# Patient Record
Sex: Male | Born: 1985
Health system: Southern US, Community
[De-identification: ages and names within clinical notes are randomized; demographics above are authoritative.]

## PROBLEM LIST (undated history)

## (undated) DIAGNOSIS — F32A Depression, unspecified: Secondary | ICD-10-CM

## (undated) DIAGNOSIS — F319 Bipolar disorder, unspecified: Secondary | ICD-10-CM

## (undated) DIAGNOSIS — S0291XA Unspecified fracture of skull, initial encounter for closed fracture: Secondary | ICD-10-CM

## (undated) DIAGNOSIS — S329XXA Fracture of unspecified parts of lumbosacral spine and pelvis, initial encounter for closed fracture: Secondary | ICD-10-CM

## (undated) DIAGNOSIS — F1110 Opioid abuse, uncomplicated: Secondary | ICD-10-CM

## (undated) DIAGNOSIS — S5290XA Unspecified fracture of unspecified forearm, initial encounter for closed fracture: Secondary | ICD-10-CM

## (undated) DIAGNOSIS — A4902 Methicillin resistant Staphylococcus aureus infection, unspecified site: Secondary | ICD-10-CM

## (undated) DIAGNOSIS — F329 Major depressive disorder, single episode, unspecified: Secondary | ICD-10-CM

## (undated) DIAGNOSIS — T1491XA Suicide attempt, initial encounter: Secondary | ICD-10-CM

## (undated) DIAGNOSIS — M109 Gout, unspecified: Secondary | ICD-10-CM

## (undated) DIAGNOSIS — F419 Anxiety disorder, unspecified: Secondary | ICD-10-CM

## (undated) DIAGNOSIS — F1910 Other psychoactive substance abuse, uncomplicated: Secondary | ICD-10-CM

## (undated) HISTORY — PX: OTHER SURGICAL HISTORY: SHX169

## (undated) HISTORY — PX: NECK SURGERY: SHX720

---
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
## 2005-05-18 ENCOUNTER — Emergency Department (HOSPITAL_COMMUNITY): Admission: EM | Admit: 2005-05-18 | Discharge: 2005-05-18 | Payer: Self-pay | Admitting: Emergency Medicine

## 2005-05-19 ENCOUNTER — Emergency Department (HOSPITAL_COMMUNITY): Admission: EM | Admit: 2005-05-19 | Discharge: 2005-05-19 | Payer: Self-pay | Admitting: Emergency Medicine

## 2005-09-18 ENCOUNTER — Inpatient Hospital Stay (HOSPITAL_COMMUNITY): Admission: EM | Admit: 2005-09-18 | Discharge: 2005-10-14 | Payer: Self-pay | Admitting: Emergency Medicine

## 2005-09-25 ENCOUNTER — Ambulatory Visit: Payer: Self-pay | Admitting: Physical Medicine & Rehabilitation

## 2005-12-14 ENCOUNTER — Ambulatory Visit: Payer: Self-pay | Admitting: Internal Medicine

## 2005-12-15 ENCOUNTER — Ambulatory Visit: Payer: Self-pay | Admitting: *Deleted

## 2005-12-29 ENCOUNTER — Encounter: Admission: RE | Admit: 2005-12-29 | Discharge: 2006-03-29 | Payer: Self-pay | Admitting: Orthopedic Surgery

## 2007-01-28 ENCOUNTER — Emergency Department (HOSPITAL_COMMUNITY): Admission: EM | Admit: 2007-01-28 | Discharge: 2007-01-28 | Payer: Self-pay | Admitting: Emergency Medicine

## 2007-01-28 ENCOUNTER — Ambulatory Visit: Payer: Self-pay | Admitting: Psychiatry

## 2007-01-28 ENCOUNTER — Inpatient Hospital Stay (HOSPITAL_COMMUNITY): Admission: AD | Admit: 2007-01-28 | Discharge: 2007-01-31 | Payer: Self-pay | Admitting: Psychiatry

## 2007-03-04 ENCOUNTER — Inpatient Hospital Stay (HOSPITAL_COMMUNITY): Admission: RE | Admit: 2007-03-04 | Discharge: 2007-03-07 | Payer: Self-pay | Admitting: Orthopedic Surgery

## 2007-03-07 ENCOUNTER — Ambulatory Visit: Payer: Self-pay | Admitting: Infectious Diseases

## 2007-03-21 ENCOUNTER — Emergency Department (HOSPITAL_COMMUNITY): Admission: EM | Admit: 2007-03-21 | Discharge: 2007-03-21 | Payer: Self-pay | Admitting: Emergency Medicine

## 2007-03-31 ENCOUNTER — Ambulatory Visit (HOSPITAL_COMMUNITY): Admission: RE | Admit: 2007-03-31 | Discharge: 2007-03-31 | Payer: Self-pay | Admitting: Orthopedic Surgery

## 2007-04-20 ENCOUNTER — Telehealth: Payer: Self-pay | Admitting: Infectious Disease

## 2007-07-29 ENCOUNTER — Inpatient Hospital Stay (HOSPITAL_COMMUNITY): Admission: RE | Admit: 2007-07-29 | Discharge: 2007-08-02 | Payer: Self-pay | Admitting: Orthopedic Surgery

## 2007-07-31 ENCOUNTER — Ambulatory Visit: Payer: Self-pay | Admitting: Infectious Disease

## 2007-08-01 ENCOUNTER — Encounter: Admission: RE | Admit: 2007-08-01 | Discharge: 2007-08-01 | Payer: Self-pay | Admitting: Orthopedic Surgery

## 2007-09-12 ENCOUNTER — Encounter: Payer: Self-pay | Admitting: Infectious Disease

## 2007-09-12 ENCOUNTER — Ambulatory Visit (HOSPITAL_COMMUNITY): Admission: RE | Admit: 2007-09-12 | Discharge: 2007-09-12 | Payer: Self-pay | Admitting: Orthopedic Surgery

## 2007-09-22 ENCOUNTER — Telehealth: Payer: Self-pay | Admitting: Infectious Disease

## 2007-09-26 ENCOUNTER — Telehealth: Payer: Self-pay | Admitting: Infectious Disease

## 2007-09-30 ENCOUNTER — Ambulatory Visit (HOSPITAL_COMMUNITY): Admission: RE | Admit: 2007-09-30 | Discharge: 2007-09-30 | Payer: Self-pay | Admitting: Infectious Disease

## 2007-10-03 ENCOUNTER — Telehealth: Payer: Self-pay | Admitting: Infectious Disease

## 2008-03-05 ENCOUNTER — Emergency Department (HOSPITAL_COMMUNITY): Admission: EM | Admit: 2008-03-05 | Discharge: 2008-03-05 | Payer: Self-pay | Admitting: *Deleted

## 2008-03-12 ENCOUNTER — Emergency Department (HOSPITAL_COMMUNITY): Admission: EM | Admit: 2008-03-12 | Discharge: 2008-03-12 | Payer: Self-pay | Admitting: Emergency Medicine

## 2008-05-14 ENCOUNTER — Emergency Department (HOSPITAL_COMMUNITY): Admission: EM | Admit: 2008-05-14 | Discharge: 2008-05-14 | Payer: Self-pay | Admitting: Emergency Medicine
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
## 2008-09-26 ENCOUNTER — Emergency Department (HOSPITAL_COMMUNITY): Admission: EM | Admit: 2008-09-26 | Discharge: 2008-09-26 | Payer: Self-pay | Admitting: Family Medicine

## 2009-03-08 ENCOUNTER — Emergency Department (HOSPITAL_COMMUNITY): Admission: EM | Admit: 2009-03-08 | Discharge: 2009-03-08 | Payer: Self-pay | Admitting: Emergency Medicine

## 2009-08-16 ENCOUNTER — Emergency Department (HOSPITAL_COMMUNITY): Admission: EM | Admit: 2009-08-16 | Discharge: 2009-08-16 | Payer: Self-pay | Admitting: Emergency Medicine

## 2010-01-11 ENCOUNTER — Emergency Department (HOSPITAL_COMMUNITY): Admission: EM | Admit: 2010-01-11 | Discharge: 2010-01-11 | Payer: Self-pay | Admitting: Emergency Medicine

## 2010-06-29 ENCOUNTER — Encounter: Payer: Self-pay | Admitting: Infectious Disease

## 2010-08-26 ENCOUNTER — Inpatient Hospital Stay (INDEPENDENT_AMBULATORY_CARE_PROVIDER_SITE_OTHER)
Admission: RE | Admit: 2010-08-26 | Discharge: 2010-08-26 | Disposition: A | Discharge status: Home / Self Care | Payer: Self-pay | Source: Ambulatory Visit | Attending: Family Medicine | Admitting: Family Medicine

## 2010-08-26 DIAGNOSIS — M542 Cervicalgia: Secondary | ICD-10-CM

## 2010-08-26 DIAGNOSIS — M62838 Other muscle spasm: Secondary | ICD-10-CM
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
## 2010-10-21 NOTE — Op Note (Signed)
NAMEBRAXLEY, BALANDRAN NO.:  [id number]   MEDICAL RECORD NO.:  [id number]          PATIENT TYPE:  OIB   LOCATION:  5020                         FACILITY:  [facility]   PHYSICIAN:  Doralee Albino. Carola Frost, M.D. DATE OF BIRTH:  1986-01-22   DATE OF PROCEDURE:  03/04/2007  DATE OF DISCHARGE:                               OPERATIVE REPORT   PREOPERATIVE DIAGNOSIS:  Left buttock abscess.   POSTOPERATIVE DIAGNOSIS:  Left buttock abscess.   PROCEDURE:  Incision and drainage of left buttock abscess, 10 cm x 11 cm  x 10 cm.   SURGEON:  Doralee Albino. Carola Frost, M.D.   ASSISTANT:  None.   ANESTHESIA:  General.   FINDINGS:  Purulence.   SPECIMENS:  Three sent to micro, results pending.   COMPLICATIONS:  None.   DISPOSITION:  PACU.   DRAINS:  One.   CONDITION:  Stable.   INDICATIONS FOR PROCEDURE:  Fred Reynolds is a 25 year old male with a  history of progressive left buttock pain.  He previously had iliosacral  screws which had migrated but then stopped and were asymptomatic for a  long period of time prior to this recent onset of symptoms.  We saw him  today preoperatively, his white count, sed rate, and CRP were all  elevated. The patient was febrile and he felt quite ill.  Examination  revealed a large soft tissue mass. He was sent for CT scan and his  planned hardware removal was cancellous. The CT scan demonstrated a very  large abscess and no communication with the iliosacral screw was  discernible.  Consequently, we chose not to remove the iliosacral screw  at this time as it may provide an open channel for the infection to  track down into vertebral body.  We discussed with him preoperatively  the risks and benefits of surgery including the possibility of further  surgery, failure to clear the infection, nerve injury, vessel injury and  others.  After a careful discussion, he wished to proceed.   DESCRIPTION OF PROCEDURE:  All antibiotics were held prior to taking  him  to the OR.  He was positioned left side up, standard prep and drape. A 4  cm incision was made directly over the pointing area which was posterior  to the old SI screw insertion incision.  We encountered a deep abscess  which was drained of several hundred mL of purulence.  It was not foul  smelling. It was irrigated with 3000 mL of normal saline under no  pressure without the pulse lavage.  A large drain was then placed and  brought out through the skin near the incision and tied into place.  The  wound was then closed with one PDS suture and widely dispersed simple  nylon sutures.  The patient had a sterile large bulky dressing applied  to try to eliminate some of the dead spaces and was then awakened from  anesthesia and transported the PACU in stable condition.   PROGNOSIS:  Fred Reynolds has an enormous abscess in his buttock area.  It does not appear to communicate with the  screw which, again, was  previously asymptomatic.  I would like to remove the screw but I am  concerned about the possibility of deep tracking into the vertebral  body.  We will ask for ID consultation and assistance. 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  He will be on empiric  vancomycin and gentamicin pending culture results.      Doralee Albino. Carola Frost, M.D.  Electronically Signed     MHH/MEDQ  D:  03/04/2007  T:  03/05/2007  Job:  [id number]
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
## 2010-10-21 NOTE — Op Note (Signed)
NAMEJUDY, Reynolds NO.:  [id number]   MEDICAL RECORD NO.:  [id number]          PATIENT TYPE:  OIB   LOCATION:  5016                         FACILITY:  [facility]   PHYSICIAN:  Doralee Albino. Carola Frost, M.D. DATE OF BIRTH:  Feb 09, 1986   DATE OF PROCEDURE:  07/29/2007  DATE OF DISCHARGE:  08/02/2007                               OPERATIVE REPORT   PREOPERATIVE DIAGNOSIS:  Symptomatic hardware right sacroiliac joint.   POSTOPERATIVE DIAGNOSIS:  Infected hardware right sacroiliac joint.   PROCEDURES:  1. Removal of the deep implant.  2. Incision and drainage of deep abscess.   SURGEON:  Doralee Albino.  Carola Frost, M.D.   ASSISTANT:  Mearl Latin, PA-C.   ANESTHESIA:  General.   COMPLICATIONS:  None.   ESTIMATED BLOOD LOSS:  50 mL.   DISPOSITION:  To PACU.   DRAINS:  One.   CONDITION:  Stable.   PRECERTIFICATION FOR PROCEDURE:  Breeze Angell is a 25 year old  male status post removal of hardware on the contralateral side which did  grow out MRSA.  The patient has had loosening of his right SI joint and  3 days of increasing pain.  We discussed preoperatively the risks and  benefits of surgery, including the possibility of abscess or infection  there.  I was specifically also concerned about the possibility of  osteomyelitis, given his plain film appearance, which was somewhat  obscured by his morbid obesity, as the patient is nearly 400 pounds.  He  understood the risks and benefits, including the need for possible deep  drainage, and wished to proceed.   BRIEF DESCRIPTION OF PROCEDURE:  Mr. Schweer was taken to the operating  room and placed in the supine position, as this was the trajectory and  positioning during which we had placed his screw initially.  We reopened  his percutaneous incision, but given his morbid obesity this incision  need to be extended considerably both proximally and distally.  Several  assistants had to help to retract his soft tissues,  and deep self-  retaining retractors were placed just to get down to the fascia and  visualize it.  We then incised the tensor and continued down to the 7.3  cannulated screw.  We then were able to identify the head, though it was  quite difficult to engage the screwdriver, given that the screw was  loose and again the patient's size.  This extended the procedure  considerably.  We were eventually able to grasp it with the use of a  sponge stick and also to hook a right-angle retractor around the washer  and safely remove this as well.  We did encounter a localized abscess  and fluid collection around the head of the screw in addition.  This  abscess was thoroughly irrigated with low-pressure pulsatile saline, and  this was performed after we obtained cultures.  After obtaining  cultures, we began empiric vancomycin.  After thorough irrigation, we  placed a deep 3/4-inch Penrose into the area, and two deep PDS sutures,  and then loosely approximated the skin layer with 2-0 nylon.  Sterile  gently compressive dressing was applied over the Penrose drain.  The  patient was then awakened from anesthesia and transported to the PACU in  stable condition.   PROGNOSIS:  Mr. Maret appears to have an infected buttock region,  with a small abscess that we believe was successfully drained.  I think  the patient does require an MRI to determine whether or not  osteomyelitis is present, but given his morbid obesity he will not be  able to fit into the gantry here at [facility] and will instead  need to be transferred to [facility] with the assistance of an  ambulance service.  After this is obtained, we will determine the length  and nature of his treatment time.  Will also obtain infectious disease  consultation.  He will be weightbearing as tolerated.      Doralee Albino. Carola Frost, M.D.  Electronically Signed     MHH/MEDQ  D:  08/02/2007  T:  08/02/2007  Job:  [id number]

## 2010-10-21 NOTE — Discharge Summary (Signed)
[name], Reynolds NO.:  [id number]   MEDICAL RECORD NO.:  [id number]          PATIENT TYPE:  OIB   LOCATION:  5016                         FACILITY:  [facility]   PHYSICIAN:  Doralee Albino. Carola Frost, M.D. DATE OF BIRTH:  [birth year]   DATE OF ADMISSION:  07/29/2007  DATE OF DISCHARGE:                               DISCHARGE SUMMARY   DISCHARGE DIAGNOSIS:  Symptomatic right sacroiliac screw with possible  osteomyelitis of right pelvis.   ADDITIONAL DISCHARGE DIAGNOSIS:  Gastroesophageal reflux.   PROCEDURES PERFORMED:  Removal of deep implant right SI screw x1.   BRIEF HISTORY AND HOSPITAL COURSE:  [name] is a 25 year old  African American male with progressive right back and buttock pain who  presented to the office on July 27, 2007, with complaints of  increasing pain on his right buttock and back.  Patient has a past  history of iliosacral screw placement for previous fracture.  He had  also had his left SI screw removed back in October of 2008 which  presented in a similar manner.  Upon presenting to the office, Mr.  Pantano complained of increasing pain on his right side, particularly  with ambulation or prolonged sitting.  He reported some radiation of  pain down into his right leg going all the way down to his foot.  The  pain was severe that it was limiting him from working and was a causing  him great disturbance.  Patient was scheduled for surgical removal of SI  screw on July 29, 2007.  However, prior to removal of the screws,  plain radiographs were obtained at the office which raised suspicion for  a possibility of osteomyelitis of his right ileum secondary to the  hyperdense appearance of the ileum.  [name] was admitted on  July 29, 2007, for removal of symptomatic SI screw.  In addition,  after removal of the SI screw, we anticipated prompt evaluation with MRI  scan to determine the presence or lack thereof of possible  osteomyelitis.   [name] tolerated the procedure exceptionally well.  Although, he  did have some severe initial postop pain in the PACU at which time a PAC  pump was started to control the patient's pain.  We were later informed  that day that the patient would be unable to receive MRI scan at [facility] secondary to weight limitations.  Subsequently, the  nursing staff made several attempts to rearrange schedules with local  radiology group which had the capabilities to accommodate [name]  size.  [name] was then able to receive his MRI scan on Monday, the  23rd of February, and as of this dictation are still awaiting the final  readings of the MRI scan.  Wound cultures obtained during the procedure  demonstrated methicillin-resistant Staphylococcus aureus which is the  same organism that was cultured from [name] back in September of  2008.  Secondary to this, an infectious disease consult was requested  and it was recommended that [name] be placed on vancomycin and  rifampin treatment for the duration of 8  weeks.  According to the  infectious disease note, regardless of the presence or absence of  osteomyelitis of the pelvis, the duration of therapy will not change  given the patient's history.   On postoperative day #4, all logistical issues were in place with regard  to home health nursing, PICC line placement, and MRI scan.  Therefore,  [name] was ready for discharge at this time.  [name]  demonstrated stable vital signs and stable labs which supported the  decision to permit discharge from the hospital with followup in 10 to 14  days at this time.   PERTINENT LABS AND DIAGNOSTIC TESTING:  Discharge vital signs,  temperature 98.7, pulse 93, respirations 20, BP 124/81, O2 sat 97% on  room air.  CBC, WBC is 10.4, hemoglobin 13.1, hematocrit 39.5, platelets  229, sodium 137, potassium 3.7, chloride 100, CO2 of 29, glucose 113,   BUN 5, creatinine 0.79.  C-reactive protein on July 30, 2007, was  16.6 which is elevated and ESR conducted on the same day was 64 which is  also elevated.   MRI scan, at the time of this discharge summary the MRI scan is awaiting  a final reading by a radiologist.   DISCHARGE PHYSICAL EXAM:  VITALS:  Noted above.  GENERAL:  Patient is awake, alert, no acute distress; however, he is  lying prone secondary to pressure on the incision site when lying  supine.  Patient is also dressed in street clothes and ready to leave  the hospital at this moment.  LUNGS:  Clear to auscultation bilaterally.  CARDIAC:  Regular rate and rhythm.  Right buttock/hip incision with some drainage but otherwise the incision  is clean and intact.  Distal motor and sensory function is intact on the right lower  extremity.  Sensation along the DPN, SPN, and TN is intact on the right  side per light touch.  EHO, FHO, anterior tibialis, posterior tibialis,  peroneals are intact with motor testing.  There is a 2+ dorsalis pedis  pulse and distal skin and temperature is appropriate on the right side.   DISCHARGE MEDICATIONS:  1. Percocet 5/235 one to two p.o. every 4 to 6 hours as needed, 50 are      given.  2. Robaxin 500 mg 1 to 2 every 6 hours as needed, 50 are given.  3. Vancomycin 1500 mg IV every 8 hours x8 weeks, this will be given in      conjunction with home health skilled nursing.  4. Rifampin 300 mg 2 at bedtime for 8 weeks.   DISCHARGE INSTRUCTIONS:  [name] can be weightbearing as tolerated  on bilateral lower extremities as his incisions appear to be stable.  He  has no specific activity restrictions but should increase activity  slowly.  He is encouraged to refrain from lying prone and supine for a  prolonged period of time to prevent the formation of decubitus bedsores.  [name] does not require any formal DVT prophylaxis at this time as  he should be ambulatory throughout most of  the day and should not  require pharmacologic DVT prophylaxis.  [name] will follow up in  10 to 14 days for wound evaluation at the office.  We also recommend  that the home health skilled nursing draw a ESR and CRP on August 07, 2007, for additional evaluation.  Should Mr. Loughry have any questions  at any point in time, he is to not hesitate to call the  office at [phone].  Mr. Ricciardi has also been provided with a wound care sheet for  appropriate wound care instructions.  Again, he has been advised not to  put any ointments or lotions over the incision and upon cessation of  drainage he may leave the wound open to the air and can leave it  uncovered.      Mearl Latin, PA      Doralee Albino. Carola Frost, M.D.  Electronically Signed    KWP/MEDQ  D:  08/02/2007  T:  08/02/2007  Job:  [id number]
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
## 2010-10-24 NOTE — Op Note (Signed)
   NAME:  Fred Reynolds, Fred Reynolds NO.:  [id number]   MEDICAL RECORD NO.:  [id number]                   PATIENT TYPE:  AMB   LOCATION:  DSC                                  FACILITY:  [facility]   PHYSICIAN:  Lowell Bouton, M.D.      DATE OF BIRTH:  1986/03/15   DATE OF PROCEDURE:  02/15/2002  DATE OF DISCHARGE:                                 OPERATIVE REPORT   PREOPERATIVE DIAGNOSIS:  Bennett's fracture/dislocation, right thumb.   POSTOPERATIVE DIAGNOSIS:  Bennett's fracture/dislocation, right thumb.   PROCEDURE:  Closed reduction and percutaneous pinning of Bennett's  fracture/dislocation, right thumb.   SURGEON:  Lowell Bouton, M.D.   ANESTHESIA:  General.   OPERATIVE FINDINGS:  The patient had a typical Bennett's  fracture/dislocation at the base of the thumb.   DESCRIPTION OF PROCEDURE:  Under general anesthesia, the right hand was  prepped and draped in the usual fashion and under x-ray control, the  fracture was reduced.  The hand was then prepped with Betadine and draped in  the usual fashion.  A 4-5 K-wire was used to place across the first  metacarpal at the [facility] joint.  After positioning the K-wire in the first  metacarpal, the closed reduction was performed and the K-wire was passed  across the joint.  X-ray did not show an anatomic reduction, so the K-wire  was backed out.  A second 4-5 K-wire was then placed across the base of the  metacarpal dorsally at the level of the fracture site.  The fracture was  reduced and the fracture itself was pinned.  The other 4-5 K-wire was then  placed across the [facility] joint.  A second 4-5 K-wire was also placed obliquely  across the metacarpal joint holding the [facility] joint reduced.  The pins were  bent over and left protruding from the skin.  X-rays showed good alignment.  The patient was placed in a thumb spica splint.  He tolerated the procedure  well and went to the recovery room awake, in  stable and good condition.                                               Lowell Bouton, M.D.    EMM/MEDQ  D:  02/15/2002  T:  02/16/2002  Job:  [id number]
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
## 2010-10-24 NOTE — Discharge Summary (Signed)
NAMEWILSON, DUSENBERY NO.:  [id number]   MEDICAL RECORD NO.:  [id number]          PATIENT TYPE:  AMB   LOCATION:  SDS                          FACILITY:  [facility]   PHYSICIAN:  Mearl Latin, PA       DATE OF BIRTH:  10-Oct-1985   DATE OF ADMISSION:  03/31/2007  DATE OF DISCHARGE:  03/31/2007                               DISCHARGE SUMMARY   DISCHARGE DIAGNOSES:  Left buttocks abscess. Additional diagnosis,  gastroesophageal reflux.   PROCEDURE:  Incision and drainage of left buttocks abscess 10 cm x 11 cm  x 10 cm.   BRIEF HISTORY/COURSE:  Fred Reynolds is a 25 year old African-American  male with a history of progressive left buttocks pain.  He previously  had iliosacral screws placed for previous fracture which had migrated  but stopped and were asymptomatic for a long period of time.  Over the  last several months, Mr. Dy has been complaining of some left-  sided pain in the SI region prompting a visit to the office for increase  with regards to removal of the SI screws.  On presentation on March 04, 2007, the original procedure to be performed was removal of SI  screws.  However, on preoperative examination and labs, some  abnormalities were noted.  Preoperatively we observed his white count  and sed rate and C-reactive protein were all elevated.  The patient was  febrile at the time and felt quite ill.   Physical examination in the day surgical unit, yielded a large soft  tissue mass over the left buttocks.  Subsequently he was sent for a CT  scan which demonstrated a very large abscess but without communication  to the SI screws.  Therefore the planned hardware removal was cancelled  and Mr. Rockwell was brought to the OR for incision and drainage of the  abscess.  Mr. Petitjean hospital stay was 3 days in duration and was  uncomplicated. He was seen by infectious disease who recommended the  placement of a PICC  line with subsequent IV  vancomycin for treatment.  On postoperative day Mr. Besson was deemed stable enough to return to  home after thorough explanation of treatment and followup.   Discharge labs, sodium 135, potassium 4.1, chloride 98, CO2 32, glucose  125, BUN 4, creatinine 0.78, calcium 3.4.  CBC WBC 13.4, hemoglobin  11.6, hematocrit 34.7, platelets 389.   DISCHARGE MEDICATIONS:  1. Vancomycin 1 g IV to be supplied by home health.  2. Percocet 5/325 one to two every 4 to 6 hours as needed.  3. Neurontin 300 mg 3 times a day.  4. Campral 333 mg 3 times a day.  5. Trazodone 100 mg at bedtime.   DISCHARGE INSTRUCTIONS:  Mr. Delduca is to be weightbearing as  tolerated on the left lower extremity.  He is to perform daily  dressing  changes.  He may stop  dressing changes once the wound remains dry  without any drainage.  The wound may get wet after it has been dry for  36 to 48 hours.  He is  to only use soap and water to clean the wound.  Mr. Cauthon will utilize his PICC line for delivery of vancomycin.  He  is to follow up at the infectious disease clinic in 2 weeks from  discharge.  Phone [phone].  Mr. Mollenkopf will return to Dr. Carola Frost in 1  week for examination of his surgical wound.  Mr. Winkels has  unrestricted activity at this time.  Due to maintain ambulatory status,  no DVT prophylaxis is required. We still, however, are awaiting for  definitive results from wound cultures to determine the duration  vancomycin.  Should Mr. Recupero have any questions prior to his return  visit he is to contact the office at [phone].   When referring to the hospital course and it being uncomplicated, just  note that Hemovac drain was pulled on postoperative day #2 without any  complication.      Mearl Latin, PA     KWP/MEDQ  D:  04/08/2007  T:  04/09/2007  Job:  [id number]
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
## 2011-02-27 LAB — CBC
HCT: 37.4 — ABNORMAL LOW
HCT: 41.1
Hemoglobin: 12.7 — ABNORMAL LOW
Hemoglobin: 13.7
MCHC: 33.2
MCHC: 33.3
MCHC: 33.9
MCV: 87.6
MCV: 88.9
Platelets: 229
Platelets: 249
RBC: 4.69
RDW: 14.7
RDW: 14.8
RDW: 15.4

## 2011-02-27 LAB — WOUND CULTURE

## 2011-02-27 LAB — BASIC METABOLIC PANEL
BUN: 5 — ABNORMAL LOW
BUN: 5 — ABNORMAL LOW
CO2: 28
CO2: 29
Calcium: 8.7
Calcium: 8.8
Chloride: 100
Creatinine, Ser: 0.79
GFR calc non Af Amer: >60
Glucose, Bld: 118 — ABNORMAL HIGH
Sodium: 136

## 2011-02-27 LAB — BODY FLUID CULTURE

## 2011-02-27 LAB — ANAEROBIC CULTURE

## 2011-02-27 LAB — VANCOMYCIN, TROUGH: Vancomycin Tr: 9.8

## 2011-02-27 LAB — SEDIMENTATION RATE: Sed Rate: 77 — ABNORMAL HIGH

## 2011-03-18 LAB — CBC
Platelets: 274
RDW: 15.6 — ABNORMAL HIGH
WBC: 7.4

## 2011-03-18 LAB — ANAEROBIC CULTURE

## 2011-03-18 LAB — GRAM STAIN

## 2011-03-18 LAB — CULTURE, ROUTINE-ABSCESS

## 2011-03-18 LAB — DIFFERENTIAL
Eosinophils Absolute: 0.4
Lymphs Abs: 1.8
Monocytes Absolute: 0.8 — ABNORMAL HIGH
Monocytes Relative: 10
Neutrophils Relative %: 58

## 2011-03-18 LAB — COMPREHENSIVE METABOLIC PANEL
ALT: 25
AST: 22
Albumin: 3.6
Alkaline Phosphatase: 94
BUN: 11
GFR calc Af Amer: >60
Potassium: 4.3
Sodium: 134 — ABNORMAL LOW
Total Protein: 7.2

## 2011-03-18 LAB — PROTIME-INR: Prothrombin Time: 13.4

## 2011-03-18 LAB — URINE CULTURE: Culture: NO GROWTH

## 2011-03-18 LAB — URINALYSIS, ROUTINE W REFLEX MICROSCOPIC
Glucose, UA: NEGATIVE
Protein, ur: NEGATIVE
Urobilinogen, UA: 0.2

## 2011-03-19 LAB — BASIC METABOLIC PANEL
CO2: 30
CO2: 32
Calcium: 8.4
Calcium: 8.7
Chloride: 98
Creatinine, Ser: 0.98
GFR calc Af Amer: >60
GFR calc Af Amer: >60
Glucose, Bld: 125 — ABNORMAL HIGH
Potassium: 4.1
Sodium: 132 — ABNORMAL LOW
Sodium: 135

## 2011-03-19 LAB — URINALYSIS, ROUTINE W REFLEX MICROSCOPIC
Glucose, UA: NEGATIVE
Ketones, ur: 15 — AB
pH: 5.5

## 2011-03-19 LAB — CULTURE, BLOOD (ROUTINE X 2): Culture: NO GROWTH

## 2011-03-19 LAB — CBC
HCT: 34.7 — ABNORMAL LOW
HCT: 42.7
Hemoglobin: 11.6 — ABNORMAL LOW
Hemoglobin: 12.6 — ABNORMAL LOW
Hemoglobin: 14.5
MCHC: 33.5
MCHC: 33.7
MCHC: 34
MCV: 89
MCV: 90.2
RBC: 3.85 — ABNORMAL LOW
RBC: 4.16 — ABNORMAL LOW
RDW: 13.8
RDW: 14.2 — ABNORMAL HIGH
WBC: 22.4 — ABNORMAL HIGH

## 2011-03-19 LAB — URINE MICROSCOPIC-ADD ON

## 2011-03-19 LAB — ANAEROBIC CULTURE

## 2011-03-19 LAB — CULTURE, ROUTINE-ABSCESS

## 2011-03-19 LAB — URINE CULTURE

## 2011-03-19 LAB — SEDIMENTATION RATE: Sed Rate: 70 — ABNORMAL HIGH

## 2011-03-20 LAB — RAPID URINE DRUG SCREEN, HOSP PERFORMED
Opiates: NOT DETECTED
Tetrahydrocannabinol: POSITIVE — AB

## 2011-03-20 LAB — DIFFERENTIAL
Basophils Absolute: 0
Basophils Relative: 0
Eosinophils Absolute: 0.3
Monocytes Relative: 7
Neutro Abs: 7.5
Neutrophils Relative %: 72

## 2011-03-20 LAB — HEPATIC FUNCTION PANEL
ALT: 42
AST: 48 — ABNORMAL HIGH
Alkaline Phosphatase: 132 — ABNORMAL HIGH
Total Protein: 8

## 2011-03-20 LAB — BASIC METABOLIC PANEL
BUN: 6
CO2: 28
Calcium: 9.7
Chloride: 102
Creatinine, Ser: 0.93
GFR calc Af Amer: >60
Glucose, Bld: 112 — ABNORMAL HIGH

## 2011-03-20 LAB — WOUND CULTURE

## 2011-03-20 LAB — CBC
MCHC: 34.5
MCV: 89.1
RBC: 5.09
RDW: 15.8 — ABNORMAL HIGH

## 2011-09-02 ENCOUNTER — Encounter (HOSPITAL_COMMUNITY): Payer: Self-pay | Admitting: Emergency Medicine

## 2011-09-02 ENCOUNTER — Emergency Department (INDEPENDENT_AMBULATORY_CARE_PROVIDER_SITE_OTHER)
Admission: EM | Admit: 2011-09-02 | Discharge: 2011-09-02 | Disposition: A | Discharge status: Home / Self Care | Payer: Self-pay | Source: Home / Self Care | Attending: Emergency Medicine | Admitting: Emergency Medicine

## 2011-09-02 DIAGNOSIS — K089 Disorder of teeth and supporting structures, unspecified: Secondary | ICD-10-CM

## 2011-09-02 DIAGNOSIS — K0889 Other specified disorders of teeth and supporting structures: Secondary | ICD-10-CM

## 2011-09-02 HISTORY — DX: Unspecified fracture of unspecified forearm, initial encounter for closed fracture: S52.90XA

## 2011-09-02 HISTORY — DX: Fracture of unspecified parts of lumbosacral spine and pelvis, initial encounter for closed fracture: S32.9XXA

## 2011-09-02 HISTORY — DX: Bipolar disorder, unspecified: F31.9

## 2011-09-02 HISTORY — DX: Methicillin resistant Staphylococcus aureus infection, unspecified site: A49.02

## 2011-09-02 HISTORY — DX: Unspecified fracture of skull, initial encounter for closed fracture: S02.91XA

## 2011-09-02 MED ORDER — PENICILLIN V POTASSIUM 500 MG PO TABS: 500.0000 mg | ORAL_TABLET | Freq: Four times a day (QID) | ORAL | Status: AC

## 2011-09-02 MED ORDER — IBUPROFEN 600 MG PO TABS: 600.0000 mg | ORAL_TABLET | Freq: Four times a day (QID) | ORAL | Status: AC | PRN

## 2011-09-02 MED ORDER — CHLORHEXIDINE GLUCONATE 0.12 % MT SOLN: OROMUCOSAL | Status: AC

## 2011-09-02 MED ORDER — HYDROCODONE-ACETAMINOPHEN 5-325 MG PO TABS
ORAL_TABLET | ORAL | Status: AC
Start: 2011-09-02 — End: 2011-09-12

## 2011-09-02 MED ORDER — LIDOCAINE VISCOUS 2 % MT SOLN: 10.0000 mL | OROMUCOSAL | Status: AC | PRN

## 2011-09-02 NOTE — ED Notes (Signed)
PT HERE WITH RIGHT UPPER TOOTH ACHE THAT STARTED THIS AM.PT TRIED SALT WATER AND IBUPROFEN FOR PAIN BUT NO RELIEF.SWELLING NOTED BUT DENIES FEVERS OR DRAINAGE

## 2011-09-02 NOTE — Discharge Instructions (Signed)
Go to www.goodrx.com to look up your medications. This will give you a list of where you can find your prescriptions at the most affordable prices.  ° °RESOURCE GUIDE ° °Dental Problems ° °Look up http://[email or web address]/[email or web address] for a schedule of the [facility]'s free dental clinics called [facility]. They have clinics all around [place]. Get there early and be prepared to wait.  ° °[facility] °[address]  [place], [place] [address] °[phone] ° °[facility] °[address] °[place], [place] °[phone] ° °Patients with Medicaid: °[facility]                     [facility] °[address]                                [address] °Phone:  [phone]                                                  Phone:  [phone] ° °If unable to pay or uninsured, contact:  [facility] or [facility] to become qualified for the adult dental clinic. ° °
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
## 2011-12-25 ENCOUNTER — Emergency Department (HOSPITAL_COMMUNITY)
Admission: EM | Admit: 2011-12-25 | Discharge: 2011-12-25 | Disposition: A | Discharge status: Home / Self Care | Payer: Self-pay | Attending: Emergency Medicine | Admitting: Emergency Medicine

## 2011-12-25 ENCOUNTER — Encounter (HOSPITAL_COMMUNITY): Payer: Self-pay | Admitting: *Deleted

## 2011-12-25 DIAGNOSIS — K029 Dental caries, unspecified: Secondary | ICD-10-CM | POA: Insufficient documentation

## 2011-12-25 DIAGNOSIS — Z8614 Personal history of Methicillin resistant Staphylococcus aureus infection: Secondary | ICD-10-CM | POA: Insufficient documentation

## 2011-12-25 DIAGNOSIS — K047 Periapical abscess without sinus: Secondary | ICD-10-CM

## 2011-12-25 DIAGNOSIS — F319 Bipolar disorder, unspecified: Secondary | ICD-10-CM | POA: Insufficient documentation

## 2011-12-25 DIAGNOSIS — F17200 Nicotine dependence, unspecified, uncomplicated: Secondary | ICD-10-CM | POA: Insufficient documentation

## 2011-12-25 DIAGNOSIS — K0889 Other specified disorders of teeth and supporting structures: Secondary | ICD-10-CM

## 2011-12-25 MED ORDER — HYDROCODONE-ACETAMINOPHEN 5-500 MG PO TABS: 1.0000 | ORAL_TABLET | Freq: Four times a day (QID) | ORAL | Status: AC | PRN

## 2011-12-25 MED ORDER — AMOXICILLIN 500 MG PO CAPS: 500.0000 mg | ORAL_CAPSULE | Freq: Three times a day (TID) | ORAL | Status: AC
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
## 2011-12-25 NOTE — ED Notes (Signed)
Pt reports right upper tooth pain-states needs to have extracted, but can't afford. Thinks it is infected. Took ibuprofen without relief.

## 2011-12-25 NOTE — ED Notes (Signed)
Pt noted to be asleep when walking by room.

## 2012-01-23 ENCOUNTER — Encounter (HOSPITAL_BASED_OUTPATIENT_CLINIC_OR_DEPARTMENT_OTHER): Payer: Self-pay | Admitting: *Deleted

## 2012-01-23 ENCOUNTER — Emergency Department (HOSPITAL_BASED_OUTPATIENT_CLINIC_OR_DEPARTMENT_OTHER)
Admission: EM | Admit: 2012-01-23 | Discharge: 2012-01-23 | Disposition: A | Discharge status: Home / Self Care | Payer: Self-pay | Attending: Emergency Medicine | Admitting: Emergency Medicine

## 2012-01-23 DIAGNOSIS — Z8614 Personal history of Methicillin resistant Staphylococcus aureus infection: Secondary | ICD-10-CM | POA: Insufficient documentation

## 2012-01-23 DIAGNOSIS — F17200 Nicotine dependence, unspecified, uncomplicated: Secondary | ICD-10-CM | POA: Insufficient documentation

## 2012-01-23 DIAGNOSIS — J45909 Unspecified asthma, uncomplicated: Secondary | ICD-10-CM | POA: Insufficient documentation

## 2012-01-23 DIAGNOSIS — F319 Bipolar disorder, unspecified: Secondary | ICD-10-CM | POA: Insufficient documentation

## 2012-01-23 DIAGNOSIS — K029 Dental caries, unspecified: Secondary | ICD-10-CM | POA: Insufficient documentation

## 2012-01-23 DIAGNOSIS — K0889 Other specified disorders of teeth and supporting structures: Secondary | ICD-10-CM

## 2012-01-23 MED ORDER — HYDROCODONE-ACETAMINOPHEN 5-325 MG PO TABS: 2.0000 | ORAL_TABLET | Freq: Four times a day (QID) | ORAL | Status: AC | PRN

## 2012-01-23 MED ORDER — PENICILLIN V POTASSIUM 500 MG PO TABS: 1000.0000 mg | ORAL_TABLET | Freq: Two times a day (BID) | ORAL | Status: AC

## 2012-01-23 NOTE — ED Notes (Signed)
Pt c/o pain to right upper side of mouth. Hx abscess. Tried OTC meds without relief.
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
## 2012-03-14 ENCOUNTER — Encounter (HOSPITAL_COMMUNITY): Payer: Self-pay

## 2012-03-14 ENCOUNTER — Emergency Department (INDEPENDENT_AMBULATORY_CARE_PROVIDER_SITE_OTHER)
Admission: EM | Admit: 2012-03-14 | Discharge: 2012-03-14 | Disposition: A | Discharge status: Home / Self Care | Payer: Self-pay | Source: Home / Self Care | Attending: Emergency Medicine | Admitting: Emergency Medicine

## 2012-03-14 DIAGNOSIS — K0889 Other specified disorders of teeth and supporting structures: Secondary | ICD-10-CM

## 2012-03-14 DIAGNOSIS — K089 Disorder of teeth and supporting structures, unspecified: Secondary | ICD-10-CM

## 2012-03-14 MED ORDER — HYDROCODONE-ACETAMINOPHEN 5-325 MG PO TABS: 2.0000 | ORAL_TABLET | ORAL | Status: DC | PRN

## 2012-03-14 MED ORDER — CLINDAMYCIN HCL 150 MG PO CAPS: 300.0000 mg | ORAL_CAPSULE | Freq: Four times a day (QID) | ORAL | Status: DC
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
## 2012-03-16 NOTE — ED Provider Notes (Signed)
Medical screening examination/treatment/procedure(s) were performed by non-physician practitioner and as supervising physician I was immediately available for consultation/collaboration.  Leslee Home, M.D.   Reuben Likes, MD 03/16/12 [phone]

## 2012-08-13 ENCOUNTER — Emergency Department (HOSPITAL_BASED_OUTPATIENT_CLINIC_OR_DEPARTMENT_OTHER)
Admission: EM | Admit: 2012-08-13 | Discharge: 2012-08-13 | Disposition: A | Discharge status: Home / Self Care | Payer: Self-pay | Attending: Emergency Medicine | Admitting: Emergency Medicine

## 2012-08-13 ENCOUNTER — Encounter (HOSPITAL_BASED_OUTPATIENT_CLINIC_OR_DEPARTMENT_OTHER): Payer: Self-pay | Admitting: Emergency Medicine

## 2012-08-13 DIAGNOSIS — S025XXA Fracture of tooth (traumatic), initial encounter for closed fracture: Secondary | ICD-10-CM | POA: Insufficient documentation

## 2012-08-13 DIAGNOSIS — Y929 Unspecified place or not applicable: Secondary | ICD-10-CM | POA: Insufficient documentation

## 2012-08-13 DIAGNOSIS — Z8781 Personal history of (healed) traumatic fracture: Secondary | ICD-10-CM | POA: Insufficient documentation

## 2012-08-13 DIAGNOSIS — Z8659 Personal history of other mental and behavioral disorders: Secondary | ICD-10-CM | POA: Insufficient documentation

## 2012-08-13 DIAGNOSIS — K029 Dental caries, unspecified: Secondary | ICD-10-CM | POA: Insufficient documentation

## 2012-08-13 DIAGNOSIS — Y9389 Activity, other specified: Secondary | ICD-10-CM | POA: Insufficient documentation

## 2012-08-13 DIAGNOSIS — Z8614 Personal history of Methicillin resistant Staphylococcus aureus infection: Secondary | ICD-10-CM | POA: Insufficient documentation

## 2012-08-13 DIAGNOSIS — F17200 Nicotine dependence, unspecified, uncomplicated: Secondary | ICD-10-CM | POA: Insufficient documentation

## 2012-08-13 DIAGNOSIS — J45909 Unspecified asthma, uncomplicated: Secondary | ICD-10-CM | POA: Insufficient documentation

## 2012-08-13 DIAGNOSIS — X58XXXA Exposure to other specified factors, initial encounter: Secondary | ICD-10-CM | POA: Insufficient documentation

## 2012-08-13 MED ORDER — PENICILLIN V POTASSIUM 500 MG PO TABS: 500.0000 mg | ORAL_TABLET | Freq: Three times a day (TID) | ORAL | Status: DC

## 2012-08-13 MED ORDER — OXYCODONE-ACETAMINOPHEN 5-325 MG PO TABS: 1.0000 | ORAL_TABLET | ORAL | Status: DC | PRN
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
## 2012-08-13 NOTE — ED Notes (Signed)
Dental pain x 2 days.  Pt attempted to pull it out with pliers.

## 2012-10-23 ENCOUNTER — Encounter (HOSPITAL_BASED_OUTPATIENT_CLINIC_OR_DEPARTMENT_OTHER): Payer: Self-pay

## 2012-10-23 ENCOUNTER — Emergency Department (HOSPITAL_BASED_OUTPATIENT_CLINIC_OR_DEPARTMENT_OTHER)
Admission: EM | Admit: 2012-10-23 | Discharge: 2012-10-23 | Disposition: A | Discharge status: Home / Self Care | Payer: Self-pay | Attending: Emergency Medicine | Admitting: Emergency Medicine

## 2012-10-23 DIAGNOSIS — IMO0002 Reserved for concepts with insufficient information to code with codable children: Secondary | ICD-10-CM | POA: Insufficient documentation

## 2012-10-23 DIAGNOSIS — J45909 Unspecified asthma, uncomplicated: Secondary | ICD-10-CM | POA: Insufficient documentation

## 2012-10-23 DIAGNOSIS — Z8781 Personal history of (healed) traumatic fracture: Secondary | ICD-10-CM | POA: Insufficient documentation

## 2012-10-23 DIAGNOSIS — F17200 Nicotine dependence, unspecified, uncomplicated: Secondary | ICD-10-CM | POA: Insufficient documentation

## 2012-10-23 DIAGNOSIS — Z8659 Personal history of other mental and behavioral disorders: Secondary | ICD-10-CM | POA: Insufficient documentation

## 2012-10-23 DIAGNOSIS — Z8614 Personal history of Methicillin resistant Staphylococcus aureus infection: Secondary | ICD-10-CM | POA: Insufficient documentation

## 2012-10-23 DIAGNOSIS — L0291 Cutaneous abscess, unspecified: Secondary | ICD-10-CM

## 2012-10-23 MED ORDER — LIDOCAINE HCL 2 % IJ SOLN
5.0000 mL | Freq: Once | INTRAMUSCULAR | Status: AC
  Administered 2012-10-23: 100 mg
  Filled 2012-10-23: qty 20

## 2012-10-23 MED ORDER — SULFAMETHOXAZOLE-TRIMETHOPRIM 800-160 MG PO TABS: 1.0000 | ORAL_TABLET | Freq: Two times a day (BID) | ORAL | Status: DC

## 2012-10-23 NOTE — ED Notes (Signed)
I & D tray is at the bedside set up and ready for the doctor to use. 

## 2012-10-23 NOTE — ED Notes (Signed)
Pt presents with swelling to R AC area swelling, redness, denies IV drug use/abuse.  Pt states that he thinks he was bitten by a spider, has appearance of abscess.
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
## 2012-12-26 ENCOUNTER — Emergency Department (HOSPITAL_COMMUNITY)
Admission: EM | Admit: 2012-12-26 | Discharge: 2012-12-26 | Disposition: A | Discharge status: Home / Self Care | Payer: Self-pay | Attending: Emergency Medicine | Admitting: Emergency Medicine

## 2012-12-26 ENCOUNTER — Encounter (HOSPITAL_COMMUNITY): Payer: Self-pay | Admitting: *Deleted

## 2012-12-26 DIAGNOSIS — Z8614 Personal history of Methicillin resistant Staphylococcus aureus infection: Secondary | ICD-10-CM | POA: Insufficient documentation

## 2012-12-26 DIAGNOSIS — K089 Disorder of teeth and supporting structures, unspecified: Secondary | ICD-10-CM | POA: Insufficient documentation

## 2012-12-26 DIAGNOSIS — K0889 Other specified disorders of teeth and supporting structures: Secondary | ICD-10-CM

## 2012-12-26 DIAGNOSIS — Z8781 Personal history of (healed) traumatic fracture: Secondary | ICD-10-CM | POA: Insufficient documentation

## 2012-12-26 DIAGNOSIS — R51 Headache: Secondary | ICD-10-CM | POA: Insufficient documentation

## 2012-12-26 DIAGNOSIS — J45909 Unspecified asthma, uncomplicated: Secondary | ICD-10-CM | POA: Insufficient documentation

## 2012-12-26 DIAGNOSIS — Z8659 Personal history of other mental and behavioral disorders: Secondary | ICD-10-CM | POA: Insufficient documentation

## 2012-12-26 DIAGNOSIS — F17200 Nicotine dependence, unspecified, uncomplicated: Secondary | ICD-10-CM | POA: Insufficient documentation

## 2012-12-26 DIAGNOSIS — R1110 Vomiting, unspecified: Secondary | ICD-10-CM | POA: Insufficient documentation

## 2012-12-26 MED ORDER — PENICILLIN V POTASSIUM 500 MG PO TABS: 500.0000 mg | ORAL_TABLET | Freq: Four times a day (QID) | ORAL | Status: AC

## 2012-12-26 MED ORDER — HYDROCODONE-ACETAMINOPHEN 5-325 MG PO TABS: 1.0000 | ORAL_TABLET | ORAL | Status: DC | PRN
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
## 2012-12-26 NOTE — ED Notes (Signed)
Broken right upper tooth since saturday

## 2012-12-27 NOTE — ED Provider Notes (Signed)
Medical screening examination/treatment/procedure(s) were performed by non-physician practitioner and as supervising physician I was immediately available for consultation/collaboration.   Ashby Dawes, MD 12/27/12 1007

## 2013-06-27 ENCOUNTER — Encounter (HOSPITAL_COMMUNITY): Payer: Self-pay | Admitting: Emergency Medicine

## 2013-06-27 ENCOUNTER — Emergency Department (INDEPENDENT_AMBULATORY_CARE_PROVIDER_SITE_OTHER)
Admission: EM | Admit: 2013-06-27 | Discharge: 2013-06-27 | Disposition: A | Discharge status: Nursing Home (Includes ICF) | Payer: No Typology Code available for payment source | Source: Home / Self Care

## 2013-06-27 DIAGNOSIS — R630 Anorexia: Secondary | ICD-10-CM | POA: Insufficient documentation

## 2013-06-27 DIAGNOSIS — Y9389 Activity, other specified: Secondary | ICD-10-CM | POA: Insufficient documentation

## 2013-06-27 DIAGNOSIS — S3981XA Other specified injuries of abdomen, initial encounter: Secondary | ICD-10-CM | POA: Insufficient documentation

## 2013-06-27 DIAGNOSIS — Z8614 Personal history of Methicillin resistant Staphylococcus aureus infection: Secondary | ICD-10-CM | POA: Insufficient documentation

## 2013-06-27 DIAGNOSIS — Y929 Unspecified place or not applicable: Secondary | ICD-10-CM | POA: Insufficient documentation

## 2013-06-27 DIAGNOSIS — X500XXA Overexertion from strenuous movement or load, initial encounter: Secondary | ICD-10-CM | POA: Insufficient documentation

## 2013-06-27 DIAGNOSIS — R109 Unspecified abdominal pain: Secondary | ICD-10-CM

## 2013-06-27 DIAGNOSIS — S298XXA Other specified injuries of thorax, initial encounter: Secondary | ICD-10-CM | POA: Insufficient documentation

## 2013-06-27 DIAGNOSIS — IMO0002 Reserved for concepts with insufficient information to code with codable children: Secondary | ICD-10-CM | POA: Insufficient documentation

## 2013-06-27 DIAGNOSIS — F17200 Nicotine dependence, unspecified, uncomplicated: Secondary | ICD-10-CM | POA: Insufficient documentation

## 2013-06-27 DIAGNOSIS — Z8781 Personal history of (healed) traumatic fracture: Secondary | ICD-10-CM | POA: Insufficient documentation

## 2013-06-27 DIAGNOSIS — Z8659 Personal history of other mental and behavioral disorders: Secondary | ICD-10-CM | POA: Insufficient documentation

## 2013-06-27 DIAGNOSIS — J45909 Unspecified asthma, uncomplicated: Secondary | ICD-10-CM | POA: Insufficient documentation

## 2013-06-27 MED ORDER — KETOROLAC TROMETHAMINE 30 MG/ML IJ SOLN
30.0000 mg | Freq: Once | INTRAMUSCULAR | Status: AC
  Administered 2013-06-27: 30 mg via INTRAMUSCULAR

## 2013-06-27 MED ORDER — KETOROLAC TROMETHAMINE 30 MG/ML IJ SOLN
INTRAMUSCULAR | Status: AC
  Filled 2013-06-27: qty 1

## 2013-06-27 MED ORDER — OXYCODONE-ACETAMINOPHEN 5-325 MG PO TABS
1.0000 | ORAL_TABLET | Freq: Once | ORAL | Status: AC
  Administered 2013-06-27: 1 via ORAL
  Filled 2013-06-27: qty 1

## 2013-06-27 NOTE — ED Notes (Signed)
Pt sent from urgent care with right upper quad pain and reports has been going on since Saturday after moving furniture.  Pt was positive murphy's sign and sent here to r/o chole.

## 2013-06-27 NOTE — ED Notes (Signed)
Called [facility], complaining about wait, referred patient to speak with ed staff
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
## 2013-06-27 NOTE — ED Notes (Signed)
Patient denies pain and is resting comfortably.  

## 2013-06-27 NOTE — ED Notes (Signed)
Patient states thought he had gas Having upper right side abd pain past 4 days Was moving some furniture over the weekend Took some gerimox with no relief

## 2013-06-27 NOTE — ED Notes (Signed)
No answer in lobby.

## 2013-06-28 ENCOUNTER — Emergency Department (HOSPITAL_COMMUNITY): Payer: No Typology Code available for payment source

## 2013-06-28 ENCOUNTER — Emergency Department (HOSPITAL_COMMUNITY)
Admission: EM | Admit: 2013-06-28 | Discharge: 2013-06-28 | Disposition: A | Discharge status: Home / Self Care | Payer: No Typology Code available for payment source | Attending: Emergency Medicine | Admitting: Emergency Medicine

## 2013-06-28 DIAGNOSIS — R109 Unspecified abdominal pain: Secondary | ICD-10-CM

## 2013-06-28 DIAGNOSIS — T148XXA Other injury of unspecified body region, initial encounter: Secondary | ICD-10-CM

## 2013-06-28 DIAGNOSIS — R0789 Other chest pain: Secondary | ICD-10-CM

## 2013-06-28 LAB — CBC WITH DIFFERENTIAL/PLATELET
Basophils Absolute: 0 10*3/uL (ref 0.0–0.1)
Basophils Relative: 0 % (ref 0–1)
Eosinophils Absolute: 0.2 10*3/uL (ref 0.0–0.7)
Eosinophils Relative: 1 % (ref 0–5)
HEMATOCRIT: 48.9 % (ref 39.0–52.0)
HEMOGLOBIN: 17.3 g/dL — AB (ref 13.0–17.0)
LYMPHS ABS: 2.5 10*3/uL (ref 0.7–4.0)
Lymphocytes Relative: 25 % (ref 12–46)
MCH: 31.2 pg (ref 26.0–34.0)
MCHC: 35.4 g/dL (ref 30.0–36.0)
MCV: 88.1 fL (ref 78.0–100.0)
MONO ABS: 0.8 10*3/uL (ref 0.1–1.0)
MONOS PCT: 8 % (ref 3–12)
NEUTROS ABS: 6.8 10*3/uL (ref 1.7–7.7)
NEUTROS PCT: 66 % (ref 43–77)
Platelets: 249 10*3/uL (ref 150–400)
RBC: 5.55 MIL/uL (ref 4.22–5.81)
RDW: 13.9 % (ref 11.5–15.5)
WBC: 10.4 10*3/uL (ref 4.0–10.5)

## 2013-06-28 LAB — COMPREHENSIVE METABOLIC PANEL
ALK PHOS: 97 U/L (ref 39–117)
ALT: 25 U/L (ref 0–53)
AST: 25 U/L (ref 0–37)
Albumin: 4.4 g/dL (ref 3.5–5.2)
BILIRUBIN TOTAL: 0.2 mg/dL — AB (ref 0.3–1.2)
BUN: 14 mg/dL (ref 6–23)
CHLORIDE: 101 meq/L (ref 96–112)
CO2: 22 mEq/L (ref 19–32)
Calcium: 9.9 mg/dL (ref 8.4–10.5)
Creatinine, Ser: 0.99 mg/dL (ref 0.50–1.35)
GLUCOSE: 96 mg/dL (ref 70–99)
POTASSIUM: 4.8 meq/L (ref 3.7–5.3)
Sodium: 140 mEq/L (ref 137–147)
Total Protein: 9 g/dL — ABNORMAL HIGH (ref 6.0–8.3)

## 2013-06-28 LAB — LIPASE, BLOOD: LIPASE: 51 U/L (ref 11–59)

## 2013-06-28 LAB — URINALYSIS, ROUTINE W REFLEX MICROSCOPIC
BILIRUBIN URINE: NEGATIVE
Glucose, UA: NEGATIVE mg/dL
Hgb urine dipstick: NEGATIVE
KETONES UR: NEGATIVE mg/dL
NITRITE: NEGATIVE
PROTEIN: NEGATIVE mg/dL
Specific Gravity, Urine: 1.019 (ref 1.005–1.030)
UROBILINOGEN UA: 0.2 mg/dL (ref 0.0–1.0)
pH: 5.5 (ref 5.0–8.0)

## 2013-06-28 LAB — POCT I-STAT TROPONIN I: Troponin i, poc: 0 ng/mL (ref 0.00–0.08)

## 2013-06-28 LAB — URINE MICROSCOPIC-ADD ON

## 2013-06-28 MED ORDER — MORPHINE SULFATE 4 MG/ML IJ SOLN
4.0000 mg | Freq: Once | INTRAMUSCULAR | Status: AC
  Administered 2013-06-28: 4 mg via INTRAVENOUS
  Filled 2013-06-28: qty 1

## 2013-06-28 MED ORDER — NAPROXEN SODIUM 220 MG PO TABS: 220.0000 mg | ORAL_TABLET | Freq: Two times a day (BID) | ORAL | Status: DC

## 2013-06-28 MED ORDER — HYDROCODONE-ACETAMINOPHEN 5-325 MG PO TABS: 1.0000 | ORAL_TABLET | ORAL | Status: DC | PRN

## 2013-06-28 NOTE — Discharge Instructions (Signed)
Abdominal Pain, Adult Many things can cause abdominal pain. Usually, abdominal pain is not caused by a disease and will improve without treatment. It can often be observed and treated at home. Your health care provider will do a physical exam and possibly order blood tests and X-rays to help determine the seriousness of your pain. However, in many cases, more time must pass before a clear cause of the pain can be found. Before that point, your health care provider may not know if you need more testing or further treatment. HOME CARE INSTRUCTIONS  Monitor your abdominal pain for any changes. The following actions may help to alleviate any discomfort you are experiencing:  Only take over-the-counter or prescription medicines as directed by your health care provider.  Do not take laxatives unless directed to do so by your health care provider.  Try a clear liquid diet (broth, tea, or water) as directed by your health care provider. Slowly move to a bland diet as tolerated. SEEK MEDICAL CARE IF:  You have unexplained abdominal pain.  You have abdominal pain associated with nausea or diarrhea.  You have pain when you urinate or have a bowel movement.  You experience abdominal pain that wakes you in the night.  You have abdominal pain that is worsened or improved by eating food.  You have abdominal pain that is worsened with eating fatty foods. SEEK IMMEDIATE MEDICAL CARE IF:   Your pain does not go away within 2 hours.  You have a fever.  You keep throwing up (vomiting).  Your pain is felt only in portions of the abdomen, such as the right side or the left lower portion of the abdomen.  You pass bloody or black tarry stools. MAKE SURE YOU:  Understand these instructions.   Will watch your condition.   Will get help right away if you are not doing well or get worse.  Document Released: 03/04/2005 Document Revised: 03/15/2013 Document Reviewed: 02/01/2013 [facility] Patient  Information 2014 [place], [place].  Chest Wall Pain Chest wall pain is pain in or around the bones and muscles of your chest. It may take up to 6 weeks to get better. It may take longer if you must stay physically active in your work and activities.  CAUSES  Chest wall pain may happen on its own. However, it may be caused by:  A viral illness like the flu.  Injury.  Coughing.  Exercise.  Arthritis.  Fibromyalgia.  Shingles. HOME CARE INSTRUCTIONS   Avoid overtiring physical activity. Try not to strain or perform activities that cause pain. This includes any activities using your chest or your abdominal and side muscles, especially if heavy weights are used.  Put ice on the sore area.  Put ice in a plastic bag.  Place a towel between your skin and the bag.  Leave the ice on for 15-20 minutes per hour while awake for the first 2 days.  Only take over-the-counter or prescription medicines for pain, discomfort, or fever as directed by your caregiver. SEEK IMMEDIATE MEDICAL CARE IF:   Your pain increases, or you are very uncomfortable.  You have a fever.  Your chest pain becomes worse.  You have new, unexplained symptoms.  You have nausea or vomiting.  You feel sweaty or lightheaded.  You have a cough with phlegm (sputum), or you cough up blood. MAKE SURE YOU:   Understand these instructions.  Will watch your condition.  Will get help right away if you are not doing well  or get worse. Document Released: 05/25/2005 Document Revised: 08/17/2011 Document Reviewed: 01/19/2011 [facility] Patient Information 2014 [place], [place].   Emergency Department Resource Guide 1) Find a Doctor and Pay Out of Pocket Although you won't have to find out who is covered by your insurance plan, it is a good idea to ask around and get recommendations. You will then need to call the office and see if the doctor you have chosen will accept you as a new patient and what types of options  they offer for patients who are self-pay. Some doctors offer discounts or will set up payment plans for their patients who do not have insurance, but you will need to ask so you aren't surprised when you get to your appointment.  2) Contact Your Local Health Department Not all health departments have doctors that can see patients for sick visits, but many do, so it is worth a call to see if yours does. If you don't know where your local health department is, you can check in your phone book. The CDC also has a tool to help you locate your state's health department, and many state websites also have listings of all of their local health departments.  3) Find a Walk-in Clinic If your illness is not likely to be very severe or complicated, you may want to try a walk in clinic. These are popping up all over the country in pharmacies, drugstores, and shopping centers. They're usually staffed by nurse practitioners or physician assistants that have been trained to treat common illnesses and complaints. They're usually fairly quick and inexpensive. However, if you have serious medical issues or chronic medical problems, these are probably not your best option.  No Primary Care Doctor: - Call Health Connect at  [phone] - they can help you locate a primary care doctor that  accepts your insurance, provides certain services, etc. - Physician Referral Service- [phone]  Chronic Pain Problems: Organization         Address  Phone   Notes  [facility]  [phone] Patients need to be referred by their primary care doctor.   Medication Assistance: Organization         Address  Phone   Notes  [facility] Medication [facility] [address] [place] [place], [place] [address] [phone] --Must be a resident of [facility] -- Must have NO insurance coverage whatsoever (no Medicaid/ Medicare, etc.) -- The pt. MUST have a primary care doctor that directs their  care regularly and follows them in the community   MedAssist  [phone]   [employer]  [phone]    Agencies that provide inexpensive medical care: Organization         Address  Phone   Notes  [facility]  [phone]   [facility]    [phone]   [facility] [address] [place], [place] [address] [phone]   [facility] [address] [place], [place] [phone]   Planned Parenthood    [phone]   [facility]    [phone]   [facility]  [address], [place] Phone:  [phone], Fax:  [phone] Hours of Operation:  9 am - 6 pm, M-F.  Also accepts Medicaid/Medicare and self-pay.  [facility]  [address], [place] Phone: [phone], Fax: [phone].  Hours of Operation:  8:30 am - 5:30 pm, M-F.  Also accepts Medicaid and self-pay.  [facility] [place] [address] [place] Phone: [phone]   [facility] [address] Natasha Bence [employer], [place] [phone], Ext. [phone] Mondays & Thursdays: 7-9 AM.  First 15 patients are seen on a first come, first serve basis.    Medicaid-accepting [facility] Providers:  Organization         Address  Phone   Notes  [facility] [address], [place] [phone] Also accepts self-pay patients.  [facility] [address] Laurell Josephs [place], [place]  [phone]   [facility] [address], [place] [phone]   [facility] [address], [place] [phone]   Renaye Rakers [address], [place]   [phone] Only accepts [place] patients after they have their name applied to their card.   Self-Pay (no insurance) in [facility]:  Organization         Address  Phone    Notes  Sickle Cell Patients, [facility] [address] [place], [place] [phone]   [facility] [address] [place], [place] [phone]   [facility] [place]  [address], [place] [phone]   [facility]/Dr. Osei-Bonsu  [address] [place] or [address], [place] [phone] Phone number for both [place] and [place] locations is the same.  [facility] [address] [place] [phone]   [facility] [address], [place] or [address] [phone]   [facility] [address] [place] [phone], phone; [phone], fax Sees patients 1st and 3rd Saturday of every month.  Must not qualify for public or private insurance (i.e. Medicaid, Medicare, [place] Health Choice, Veterans' Benefits)  Household income should be no more than 200% of the poverty level The clinic cannot treat you if you are pregnant or think you are pregnant  Sexually transmitted diseases are not treated at the clinic.    Dental Care: Organization         Address  Phone  Notes  [facility] Department of [facility] [address] [place], [place] [phone] Accepts children up to age 37 who are enrolled in [place] or [place] Health Choice; pregnant women with a Medicaid card; and children who have applied for Medicaid or [place] Health Choice, but were declined, whose parents can pay a reduced fee at time of service.  [facility] Department of [facility]  [address], [place] [phone] Accepts children up to age 86 who are enrolled in [place] or [place] Health Choice; pregnant women with a Medicaid card; and children who have applied for Medicaid or  Health Choice, but were declined, whose parents can pay a reduced fee at time of service.  [facility]  [address] [place], [place] [phone] Patients are seen by appointment only. Walk-ins are not accepted. [facility] will see patients 32 years of age and older. Monday - Tuesday (8am-5pm) Most Wednesdays (8:30-5pm) $30 per visit, cash only  [facility] Adult [employer] PROGRAM  [address], [facility] [phone] Patients  are seen by appointment only. Walk-ins are not accepted. [facility] will see patients 62 years of age and older. One Wednesday Evening (Monthly: Volunteer Based).  $30 per visit, cash only  [employer]  [phone] for adults; Children under age 60, call Graduate Pediatric Dentistry at [phone]. Children aged 16-14, please call [phone] to request a pediatric application.  Dental services are provided in all areas of dental care including fillings, crowns and bridges, complete and partial dentures, implants, gum treatment, root canals, and extractions. Preventive care is also provided. Treatment is provided to both adults and children. Patients are selected via a lottery and there is often a waiting list.   [facility] [address] [place]  [phone] [email or web address]   [facility] [address] [place], [place] [phone], Ext. [phone] Second and Fourth Thursday of each month, opens at 6:30 AM; Clinic ends at 9 AM.  Patients are seen on a first-come first-served basis, and a limited number are seen during each clinic.   [facility]  [address] Ether [place]ins [place], [place] [phone]   Eligibility Requirements You must have lived in [place], [place], or [place] counties for at least the last three months.   You cannot be eligible for state or federal sponsored [place], including [employer], [place], or [employer].   You generally cannot be eligible for healthcare insurance through your employer.    How to  apply: Eligibility screenings are held every Tuesday and Wednesday afternoon from 1:00 pm until 4:00 pm. You do not need an appointment for the interview!  [facility] [address] [place], [place] [phone]   [facility] Health Department  [phone]   [facility] Health Department  [phone]   [employer] Health Department  [phone]    Behavioral Health Resources in the Community: Intensive Outpatient Programs Organization         Address  Phone  Notes  [facility] [address] [place], [place] [phone]   [facility] Outpatient [address], [place], [place] [phone]   [facility]: [facility] [address], [place], [place]  [phone]   [facility] [address],  [place], [place] [phone] or [phone]   Substance Abuse Resources Organization         Address  Phone  Notes  [facility] Drug Services  [phone]   [facility]  [phone]   [facility] [place]  [phone]   Floydene Flock  [phone]   Residential & Outpatient Substance Abuse Program  [phone]   Psychological Services Organization         Address  Phone  Notes  [facility] Health  [phone]   [facility] Services  [phone]   [facility] [address] [place] [phone] or [phone]    Mobile Crisis Teams Organization         Address  Phone  Notes  [facility], Mobile Crisis Care Unit  [phone]   [facility]  [address] [place], [place] [phone]   Doristine Locks [address] [place] [phone]    Self-Help/Support Groups Organization         Address  Phone             Notes  Mental Health Assoc. of  - variety of support groups  336- I7437963 Call for  more information  Narcotics Anonymous (NA), [facility] [address], [place]  2 meetings at this location   Residential Sports administratorTreatment Programs Organization         Address  Phone  Notes  [facility] [address],    [place]  [phone]   [facility]  [address], [place] [phone], [place], [place] [phone]   [facility] [address], [place] [phone] Admissions: 8am-3pm M-F  [facility] [address]    [place], [place] [phone]   [facility] [address], [place], [place] [phone]   [facility] [address] [place] [address]  [place], [place] [phone]   [facility] - Intensive Outpatient [address], [place], [place] [phone]   [facility] ([facility] [address]  [place], [place] [phone] or [phone]   [facility] ([facility]) [address] [place], [place] [phone] Accepts Medicaid  [facility] [address]  [place] [phone] Substance Abuse/Addiction Treatment   [facility] Behavioral Health Resources Organization         Address  Phone  Notes  [facility]  [phone](888) [phone]   Angie FavaJulie Brannon, PhD [address] [place], [place]   [phone](336) [phone] or [phone]) [phone]   [facility]   [address] [place], [place] [phone](336) [phone]   [facility] [address], [place], [place] [phone](336) [phone] Insurance/Medicaid/sponsorship through [facility]  [facility] Families [address]                                    [place], [place] [phone](336) [phone] Therapy/tele-psych/case  [facility] [address]   [place], [place] [phone](336) [phone]    Dr. Lolly MustacheArfeen  [phone]) [phone]   [facility] [place]  [employer] [facility] 1) [address], [place] 2) [address], [place] 3)  [address], [place] [phone](336) [phone]  [phone](336) [phone]   [facility] Child Abuse Hotline  [phone](336) [phone] or [phone](336) [phone] (After Hours)
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
## 2013-06-28 NOTE — ED Notes (Signed)
Patient has ride home with father

## 2013-06-29 LAB — URINE CULTURE

## 2013-09-20 ENCOUNTER — Emergency Department (HOSPITAL_BASED_OUTPATIENT_CLINIC_OR_DEPARTMENT_OTHER)
Admission: EM | Admit: 2013-09-20 | Discharge: 2013-09-20 | Disposition: A | Discharge status: Home / Self Care | Payer: No Typology Code available for payment source | Attending: Emergency Medicine | Admitting: Emergency Medicine

## 2013-09-20 ENCOUNTER — Encounter (HOSPITAL_BASED_OUTPATIENT_CLINIC_OR_DEPARTMENT_OTHER): Payer: Self-pay | Admitting: Emergency Medicine

## 2013-09-20 DIAGNOSIS — M79609 Pain in unspecified limb: Secondary | ICD-10-CM | POA: Insufficient documentation

## 2013-09-20 DIAGNOSIS — F17200 Nicotine dependence, unspecified, uncomplicated: Secondary | ICD-10-CM | POA: Insufficient documentation

## 2013-09-20 DIAGNOSIS — M79676 Pain in unspecified toe(s): Secondary | ICD-10-CM

## 2013-09-20 DIAGNOSIS — Z8614 Personal history of Methicillin resistant Staphylococcus aureus infection: Secondary | ICD-10-CM | POA: Insufficient documentation

## 2013-09-20 DIAGNOSIS — J45909 Unspecified asthma, uncomplicated: Secondary | ICD-10-CM | POA: Insufficient documentation

## 2013-09-20 DIAGNOSIS — M109 Gout, unspecified: Secondary | ICD-10-CM | POA: Insufficient documentation

## 2013-09-20 DIAGNOSIS — Z8781 Personal history of (healed) traumatic fracture: Secondary | ICD-10-CM | POA: Insufficient documentation

## 2013-09-20 DIAGNOSIS — Z8659 Personal history of other mental and behavioral disorders: Secondary | ICD-10-CM | POA: Insufficient documentation

## 2013-09-20 DIAGNOSIS — Z87828 Personal history of other (healed) physical injury and trauma: Secondary | ICD-10-CM | POA: Insufficient documentation

## 2013-09-20 HISTORY — DX: Gout, unspecified: M10.9

## 2013-09-20 MED ORDER — OXYCODONE-ACETAMINOPHEN 5-325 MG PO TABS: 2.0000 | ORAL_TABLET | ORAL | Status: DC | PRN

## 2013-09-20 MED ORDER — COLCHICINE 0.6 MG PO TABS: ORAL_TABLET | ORAL | Status: DC

## 2013-09-20 MED ORDER — OXYCODONE-ACETAMINOPHEN 5-325 MG PO TABS: 1.0000 | ORAL_TABLET | ORAL | Status: DC | PRN

## 2013-09-20 MED ORDER — OXYCODONE-ACETAMINOPHEN 5-325 MG PO TABS
2.0000 | ORAL_TABLET | Freq: Once | ORAL | Status: AC
  Administered 2013-09-20: 2 via ORAL
  Filled 2013-09-20: qty 2

## 2013-09-20 NOTE — ED Notes (Signed)
Left foot pain- denies injury.
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
## 2013-09-20 NOTE — ED Notes (Signed)
MD at bedside. 

## 2013-11-30 ENCOUNTER — Encounter (HOSPITAL_COMMUNITY): Payer: Self-pay | Admitting: Emergency Medicine

## 2013-11-30 ENCOUNTER — Emergency Department (HOSPITAL_COMMUNITY)
Admission: EM | Admit: 2013-11-30 | Discharge: 2013-11-30 | Disposition: A | Discharge status: Home / Self Care | Payer: No Typology Code available for payment source | Attending: Emergency Medicine | Admitting: Emergency Medicine

## 2013-11-30 ENCOUNTER — Emergency Department (HOSPITAL_COMMUNITY): Payer: No Typology Code available for payment source

## 2013-11-30 DIAGNOSIS — T148XXA Other injury of unspecified body region, initial encounter: Secondary | ICD-10-CM

## 2013-11-30 DIAGNOSIS — Z79899 Other long term (current) drug therapy: Secondary | ICD-10-CM | POA: Insufficient documentation

## 2013-11-30 DIAGNOSIS — Z8659 Personal history of other mental and behavioral disorders: Secondary | ICD-10-CM | POA: Insufficient documentation

## 2013-11-30 DIAGNOSIS — Z8614 Personal history of Methicillin resistant Staphylococcus aureus infection: Secondary | ICD-10-CM | POA: Insufficient documentation

## 2013-11-30 DIAGNOSIS — S9030XA Contusion of unspecified foot, initial encounter: Secondary | ICD-10-CM | POA: Insufficient documentation

## 2013-11-30 DIAGNOSIS — Y92410 Unspecified street and highway as the place of occurrence of the external cause: Secondary | ICD-10-CM | POA: Insufficient documentation

## 2013-11-30 DIAGNOSIS — M109 Gout, unspecified: Secondary | ICD-10-CM | POA: Insufficient documentation

## 2013-11-30 DIAGNOSIS — Y9389 Activity, other specified: Secondary | ICD-10-CM | POA: Insufficient documentation

## 2013-11-30 DIAGNOSIS — S60222A Contusion of left hand, initial encounter: Secondary | ICD-10-CM

## 2013-11-30 DIAGNOSIS — J45909 Unspecified asthma, uncomplicated: Secondary | ICD-10-CM | POA: Insufficient documentation

## 2013-11-30 DIAGNOSIS — S9032XA Contusion of left foot, initial encounter: Secondary | ICD-10-CM

## 2013-11-30 DIAGNOSIS — Z8781 Personal history of (healed) traumatic fracture: Secondary | ICD-10-CM | POA: Insufficient documentation

## 2013-11-30 DIAGNOSIS — S51809A Unspecified open wound of unspecified forearm, initial encounter: Secondary | ICD-10-CM | POA: Insufficient documentation

## 2013-11-30 DIAGNOSIS — IMO0002 Reserved for concepts with insufficient information to code with codable children: Secondary | ICD-10-CM | POA: Insufficient documentation

## 2013-11-30 DIAGNOSIS — S51812A Laceration without foreign body of left forearm, initial encounter: Secondary | ICD-10-CM

## 2013-11-30 DIAGNOSIS — S60229A Contusion of unspecified hand, initial encounter: Secondary | ICD-10-CM | POA: Insufficient documentation

## 2013-11-30 MED ORDER — HYDROCODONE-ACETAMINOPHEN 5-325 MG PO TABS: 1.0000 | ORAL_TABLET | ORAL | Status: DC | PRN

## 2013-11-30 MED ORDER — HYDROCODONE-ACETAMINOPHEN 5-325 MG PO TABS
1.0000 | ORAL_TABLET | Freq: Once | ORAL | Status: AC
  Administered 2013-11-30: 1 via ORAL
  Filled 2013-11-30: qty 1
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
## 2013-12-03 NOTE — ED Provider Notes (Signed)
Medical screening examination/treatment/procedure(s) were performed by non-physician practitioner and as supervising physician I was immediately available for consultation/collaboration.   EKG Interpretation None       Makoa M Bednar, MD 12/03/13 2107 

## 2013-12-16 ENCOUNTER — Encounter (HOSPITAL_COMMUNITY): Payer: No Typology Code available for payment source | Admitting: Anesthesiology

## 2013-12-16 ENCOUNTER — Encounter (HOSPITAL_COMMUNITY): Payer: Self-pay | Admitting: Emergency Medicine

## 2013-12-16 ENCOUNTER — Encounter (HOSPITAL_COMMUNITY)
Admission: EM | Disposition: A | Discharge status: Home / Self Care | Payer: Self-pay | Source: Home / Self Care | Attending: Emergency Medicine

## 2013-12-16 ENCOUNTER — Emergency Department (HOSPITAL_COMMUNITY): Payer: No Typology Code available for payment source

## 2013-12-16 ENCOUNTER — Ambulatory Visit (HOSPITAL_COMMUNITY)
Admission: EM | Admit: 2013-12-16 | Discharge: 2013-12-16 | Disposition: A | Discharge status: Home / Self Care | Payer: No Typology Code available for payment source | Attending: Emergency Medicine | Admitting: Emergency Medicine

## 2013-12-16 ENCOUNTER — Inpatient Hospital Stay: Admit: 2013-12-16 | Payer: Self-pay | Admitting: Orthopedic Surgery

## 2013-12-16 ENCOUNTER — Emergency Department (HOSPITAL_COMMUNITY): Payer: No Typology Code available for payment source | Admitting: Anesthesiology

## 2013-12-16 DIAGNOSIS — L02519 Cutaneous abscess of unspecified hand: Secondary | ICD-10-CM | POA: Insufficient documentation

## 2013-12-16 DIAGNOSIS — F1210 Cannabis abuse, uncomplicated: Secondary | ICD-10-CM | POA: Insufficient documentation

## 2013-12-16 DIAGNOSIS — F17200 Nicotine dependence, unspecified, uncomplicated: Secondary | ICD-10-CM | POA: Insufficient documentation

## 2013-12-16 DIAGNOSIS — J45909 Unspecified asthma, uncomplicated: Secondary | ICD-10-CM | POA: Insufficient documentation

## 2013-12-16 DIAGNOSIS — F319 Bipolar disorder, unspecified: Secondary | ICD-10-CM | POA: Insufficient documentation

## 2013-12-16 DIAGNOSIS — L03119 Cellulitis of unspecified part of limb: Principal | ICD-10-CM

## 2013-12-16 HISTORY — PX: I&D EXTREMITY: SHX5045

## 2013-12-16 LAB — I-STAT CHEM 8, ED
BUN: 8 mg/dL (ref 6–23)
CHLORIDE: 104 meq/L (ref 96–112)
CREATININE: 0.9 mg/dL (ref 0.50–1.35)
Calcium, Ion: 1.05 mmol/L — ABNORMAL LOW (ref 1.12–1.23)
GLUCOSE: 108 mg/dL — AB (ref 70–99)
HEMATOCRIT: 56 % — AB (ref 39.0–52.0)
Hemoglobin: 19 g/dL — ABNORMAL HIGH (ref 13.0–17.0)
POTASSIUM: 4.2 meq/L (ref 3.7–5.3)
SODIUM: 135 meq/L — AB (ref 137–147)
TCO2: 24 mmol/L (ref 0–100)

## 2013-12-16 LAB — CBC
HEMATOCRIT: 47.9 % (ref 39.0–52.0)
Hemoglobin: 17.3 g/dL — ABNORMAL HIGH (ref 13.0–17.0)
MCH: 31.9 pg (ref 26.0–34.0)
MCHC: 36.1 g/dL — ABNORMAL HIGH (ref 30.0–36.0)
MCV: 88.4 fL (ref 78.0–100.0)
Platelets: 267 10*3/uL (ref 150–400)
RBC: 5.42 MIL/uL (ref 4.22–5.81)
RDW: 13.5 % (ref 11.5–15.5)
WBC: 17.8 10*3/uL — AB (ref 4.0–10.5)

## 2013-12-16 SURGERY — IRRIGATION AND DEBRIDEMENT EXTREMITY
Anesthesia: General | Site: Hand | Laterality: Left

## 2013-12-16 MED ORDER — ONDANSETRON HCL 4 MG/2ML IJ SOLN
INTRAMUSCULAR | Status: AC
  Filled 2013-12-16: qty 2

## 2013-12-16 MED ORDER — BUPIVACAINE HCL (PF) 0.25 % IJ SOLN
INTRAMUSCULAR | Status: DC | PRN
  Administered 2013-12-16: 10 mL

## 2013-12-16 MED ORDER — DEXTROSE-NACL 5-0.9 % IV SOLN
INTRAVENOUS | Status: DC | PRN
  Administered 2013-12-16: 22:00:00 via INTRAVENOUS

## 2013-12-16 MED ORDER — CLINDAMYCIN PHOSPHATE 600 MG/50ML IV SOLN
600.0000 mg | Freq: Once | INTRAVENOUS | Status: AC
  Administered 2013-12-16: 600 mg via INTRAVENOUS
  Filled 2013-12-16 (×2): qty 50

## 2013-12-16 MED ORDER — LIDOCAINE HCL (CARDIAC) 20 MG/ML IV SOLN
INTRAVENOUS | Status: AC
  Filled 2013-12-16: qty 5

## 2013-12-16 MED ORDER — SODIUM CHLORIDE 0.9 % IR SOLN
Status: DC | PRN
  Administered 2013-12-16: 3000 mL

## 2013-12-16 MED ORDER — DEXAMETHASONE SODIUM PHOSPHATE 4 MG/ML IJ SOLN
INTRAMUSCULAR | Status: AC
  Filled 2013-12-16: qty 1

## 2013-12-16 MED ORDER — HYDROMORPHONE HCL PF 1 MG/ML IJ SOLN
INTRAMUSCULAR | Status: AC
  Filled 2013-12-16: qty 1

## 2013-12-16 MED ORDER — OXYCODONE-ACETAMINOPHEN 5-325 MG PO TABS
2.0000 | ORAL_TABLET | Freq: Once | ORAL | Status: AC
  Administered 2013-12-16: 2 via ORAL
  Filled 2013-12-16: qty 2

## 2013-12-16 MED ORDER — SODIUM CHLORIDE 0.9 % IJ SOLN
INTRAMUSCULAR | Status: AC
  Filled 2013-12-16: qty 10

## 2013-12-16 MED ORDER — TETANUS-DIPHTH-ACELL PERTUSSIS 5-2.5-18.5 LF-MCG/0.5 IM SUSP
0.5000 mL | Freq: Once | INTRAMUSCULAR | Status: AC
  Administered 2013-12-16: 0.5 mL via INTRAMUSCULAR
  Filled 2013-12-16: qty 0.5

## 2013-12-16 MED ORDER — LACTATED RINGERS IV SOLN
INTRAVENOUS | Status: DC | PRN
  Administered 2013-12-16: 21:00:00 via INTRAVENOUS

## 2013-12-16 MED ORDER — MORPHINE SULFATE 4 MG/ML IJ SOLN
4.0000 mg | Freq: Once | INTRAMUSCULAR | Status: AC
  Administered 2013-12-16: 4 mg via INTRAVENOUS
  Filled 2013-12-16: qty 1

## 2013-12-16 MED ORDER — ONDANSETRON HCL 4 MG/2ML IJ SOLN
INTRAMUSCULAR | Status: DC | PRN
  Administered 2013-12-16: 4 mg via INTRAVENOUS

## 2013-12-16 MED ORDER — LIDOCAINE HCL (CARDIAC) 20 MG/ML IV SOLN
INTRAVENOUS | Status: DC | PRN
  Administered 2013-12-16: 100 mg via INTRAVENOUS

## 2013-12-16 MED ORDER — VANCOMYCIN HCL IN DEXTROSE 1-5 GM/200ML-% IV SOLN
INTRAVENOUS | Status: AC
  Filled 2013-12-16: qty 200

## 2013-12-16 MED ORDER — SUCCINYLCHOLINE CHLORIDE 20 MG/ML IJ SOLN
INTRAMUSCULAR | Status: DC | PRN
  Administered 2013-12-16: 160 mg via INTRAVENOUS

## 2013-12-16 MED ORDER — OXYCODONE HCL 5 MG/5ML PO SOLN: 5.0000 mg | Freq: Once | ORAL | Status: AC | PRN

## 2013-12-16 MED ORDER — PROPOFOL 10 MG/ML IV BOLUS
INTRAVENOUS | Status: DC | PRN
  Administered 2013-12-16: 200 mg via INTRAVENOUS

## 2013-12-16 MED ORDER — OXYCODONE HCL 5 MG PO TABS
ORAL_TABLET | ORAL | Status: AC
  Filled 2013-12-16: qty 1

## 2013-12-16 MED ORDER — HYDROMORPHONE HCL PF 1 MG/ML IJ SOLN
0.5000 mg | INTRAMUSCULAR | Status: DC | PRN
  Administered 2013-12-16 (×2): 0.5 mg via INTRAVENOUS

## 2013-12-16 MED ORDER — OXYCODONE-ACETAMINOPHEN 5-325 MG PO TABS: ORAL_TABLET | ORAL | Status: DC

## 2013-12-16 MED ORDER — GLYCOPYRROLATE 0.2 MG/ML IJ SOLN
INTRAMUSCULAR | Status: AC
  Filled 2013-12-16: qty 1

## 2013-12-16 MED ORDER — MORPHINE SULFATE 4 MG/ML IJ SOLN: 6.0000 mg | Freq: Once | INTRAMUSCULAR | Status: DC

## 2013-12-16 MED ORDER — DEXAMETHASONE SODIUM PHOSPHATE 4 MG/ML IJ SOLN
INTRAMUSCULAR | Status: DC | PRN
  Administered 2013-12-16: 4 mg via INTRAVENOUS

## 2013-12-16 MED ORDER — HYDROMORPHONE HCL PF 1 MG/ML IJ SOLN
0.2500 mg | INTRAMUSCULAR | Status: DC | PRN
  Administered 2013-12-16 (×4): 0.5 mg via INTRAVENOUS

## 2013-12-16 MED ORDER — OXYCODONE HCL 5 MG PO TABS
5.0000 mg | ORAL_TABLET | Freq: Once | ORAL | Status: AC | PRN
  Administered 2013-12-16: 5 mg via ORAL

## 2013-12-16 MED ORDER — SUFENTANIL CITRATE 50 MCG/ML IV SOLN
INTRAVENOUS | Status: AC
Start: 2013-12-16 — End: 2013-12-16
  Filled 2013-12-16: qty 1

## 2013-12-16 MED ORDER — VANCOMYCIN HCL 1000 MG IV SOLR
1000.0000 mg | INTRAVENOUS | Status: DC | PRN
  Administered 2013-12-16: 1000 mg via INTRAVENOUS

## 2013-12-16 MED ORDER — MIDAZOLAM HCL 2 MG/2ML IJ SOLN
INTRAMUSCULAR | Status: AC
  Filled 2013-12-16: qty 2

## 2013-12-16 MED ORDER — SUFENTANIL CITRATE 50 MCG/ML IV SOLN
INTRAVENOUS | Status: DC | PRN
  Administered 2013-12-16 (×2): 10 ug via INTRAVENOUS

## 2013-12-16 MED ORDER — BUPIVACAINE HCL (PF) 0.25 % IJ SOLN
INTRAMUSCULAR | Status: AC
  Filled 2013-12-16: qty 30

## 2013-12-16 MED ORDER — SULFAMETHOXAZOLE-TRIMETHOPRIM 800-160 MG PO TABS: 1.0000 | ORAL_TABLET | Freq: Two times a day (BID) | ORAL | Status: DC

## 2013-12-16 MED ORDER — PROPOFOL 10 MG/ML IV BOLUS
INTRAVENOUS | Status: AC
  Filled 2013-12-16: qty 20

## 2013-12-16 MED ORDER — MIDAZOLAM HCL 5 MG/5ML IJ SOLN
INTRAMUSCULAR | Status: DC | PRN
  Administered 2013-12-16: 2 mg via INTRAVENOUS

## 2013-12-16 MED ORDER — PROMETHAZINE HCL 25 MG/ML IJ SOLN
6.2500 mg | INTRAMUSCULAR | Status: DC | PRN
Start: 2013-12-16 — End: 2013-12-17
  Administered 2013-12-16: 6.25 mg via INTRAVENOUS

## 2013-12-16 MED ORDER — SUCCINYLCHOLINE CHLORIDE 20 MG/ML IJ SOLN
INTRAMUSCULAR | Status: AC
  Filled 2013-12-16: qty 1

## 2013-12-16 MED ORDER — GLYCOPYRROLATE 0.2 MG/ML IJ SOLN
INTRAMUSCULAR | Status: DC | PRN
  Administered 2013-12-16: 0.2 mg via INTRAVENOUS

## 2013-12-16 MED ORDER — ONDANSETRON HCL 4 MG/2ML IJ SOLN
4.0000 mg | Freq: Once | INTRAMUSCULAR | Status: AC
  Administered 2013-12-16: 4 mg via INTRAVENOUS
  Filled 2013-12-16: qty 2

## 2013-12-16 MED ORDER — PROMETHAZINE HCL 25 MG/ML IJ SOLN
INTRAMUSCULAR | Status: AC
  Filled 2013-12-16: qty 1

## 2013-12-16 SURGICAL SUPPLY — 48 items
BANDAGE ELASTIC 3 VELCRO ST LF (GAUZE/BANDAGES/DRESSINGS) ×3 IMPLANT
BANDAGE GAUZE ELAST BULKY 4 IN (GAUZE/BANDAGES/DRESSINGS) ×3 IMPLANT
BNDG CMPR 9X4 STRL LF SNTH (GAUZE/BANDAGES/DRESSINGS)
BNDG CONFORM 2 STRL LF (GAUZE/BANDAGES/DRESSINGS) IMPLANT
BNDG ESMARK 4X9 LF (GAUZE/BANDAGES/DRESSINGS) IMPLANT
CORDS BIPOLAR (ELECTRODE) ×3 IMPLANT
COVER SURGICAL LIGHT HANDLE (MISCELLANEOUS) ×3 IMPLANT
DECANTER SPIKE VIAL GLASS SM (MISCELLANEOUS) ×3 IMPLANT
DRSG PAD ABDOMINAL 8X10 ST (GAUZE/BANDAGES/DRESSINGS) ×6 IMPLANT
GAUZE PACKING IODOFORM 1/4X15 (GAUZE/BANDAGES/DRESSINGS) ×2 IMPLANT
GLOVE BIO SURGEON STRL SZ7.5 (GLOVE) ×3 IMPLANT
GLOVE BIOGEL PI IND STRL 8 (GLOVE) ×1 IMPLANT
GLOVE BIOGEL PI INDICATOR 8 (GLOVE) ×2
GOWN STRL REIN XL XLG (GOWN DISPOSABLE) ×5 IMPLANT
HANDPIECE INTERPULSE COAX TIP (DISPOSABLE)
KIT BASIN OR (CUSTOM PROCEDURE TRAY) ×3 IMPLANT
KIT ROOM TURNOVER OR (KITS) ×3 IMPLANT
LOOP VESSEL MAXI BLUE (MISCELLANEOUS) IMPLANT
LOOP VESSEL MINI RED (MISCELLANEOUS) IMPLANT
MANIFOLD NEPTUNE II (INSTRUMENTS) ×3 IMPLANT
NDL HYPO 25X1 1.5 SAFETY (NEEDLE) IMPLANT
NEEDLE HYPO 25X1 1.5 SAFETY (NEEDLE) IMPLANT
NS IRRIG 1000ML POUR BTL (IV SOLUTION) ×3 IMPLANT
PACK ORTHO EXTREMITY (CUSTOM PROCEDURE TRAY) ×3 IMPLANT
PAD ARMBOARD 7.5X6 YLW CONV (MISCELLANEOUS) ×6 IMPLANT
PADDING CAST ABS 3INX4YD NS (CAST SUPPLIES) ×2
PADDING CAST ABS COTTON 3X4 (CAST SUPPLIES) IMPLANT
SCRUB BETADINE 4OZ XXX (MISCELLANEOUS) ×3 IMPLANT
SET HNDPC FAN SPRY TIP SCT (DISPOSABLE) IMPLANT
SOLUTION BETADINE 4OZ (MISCELLANEOUS) ×3 IMPLANT
SPLINT PLASTER EXTRA FAST 3X15 (CAST SUPPLIES) ×2
SPLINT PLASTER GYPS XFAST 3X15 (CAST SUPPLIES) IMPLANT
SPONGE GAUZE 4X4 12PLY (GAUZE/BANDAGES/DRESSINGS) ×3 IMPLANT
SPONGE GAUZE 4X4 12PLY STER LF (GAUZE/BANDAGES/DRESSINGS) ×2 IMPLANT
SPONGE LAP 18X18 X RAY DECT (DISPOSABLE) ×3 IMPLANT
SPONGE LAP 4X18 X RAY DECT (DISPOSABLE) ×3 IMPLANT
SUCTION FRAZIER TIP 10 FR DISP (SUCTIONS) ×3 IMPLANT
SUT ETHILON 4 0 PS 2 18 (SUTURE) ×3 IMPLANT
SUT MON AB 5-0 P3 18 (SUTURE) IMPLANT
SYR CONTROL 10ML LL (SYRINGE) IMPLANT
TOWEL OR 17X26 10 PK STRL BLUE (TOWEL DISPOSABLE) ×3 IMPLANT
TUBE ANAEROBIC SPECIMEN COL (MISCELLANEOUS) IMPLANT
TUBE CONNECTING 12'X1/4 (SUCTIONS) ×1
TUBE CONNECTING 12X1/4 (SUCTIONS) ×2 IMPLANT
TUBE FEEDING 5FR 15 INCH (TUBING) IMPLANT
UNDERPAD 30X30 INCONTINENT (UNDERPADS AND DIAPERS) ×3 IMPLANT
WATER STERILE IRR 1000ML POUR (IV SOLUTION) ×3 IMPLANT
YANKAUER SUCT BULB TIP NO VENT (SUCTIONS) ×3 IMPLANT

## 2013-12-16 NOTE — Transfer of Care (Signed)
Immediate Anesthesia Transfer of Care Note  Patient: Fred MossesJohn Terrien Jr.  Procedure(s) Performed: Procedure(s): IRRIGATION AND DEBRIDEMENT EXTREMITY (Left)  Patient Location: PACU  Anesthesia Type:General  Level of Consciousness: awake, alert , oriented and patient cooperative  Airway & Oxygen Therapy: Patient Spontanous Breathing and Patient connected to face mask oxygen  Post-op Assessment: Report given to PACU RN, Post -op Vital signs reviewed and stable and Patient moving all extremities X 4  Post vital signs: Reviewed and stable  Complications: No apparent anesthesia complications

## 2013-12-16 NOTE — ED Notes (Signed)
Pt to xray at this time.

## 2013-12-16 NOTE — Brief Op Note (Signed)
12/16/2013  10:29 PM  PATIENT:  Fred MossesJohn Pawloski Jr.  28 y.o. male  PRE-OPERATIVE DIAGNOSIS:  Left hand abscess  POST-OPERATIVE DIAGNOSIS:  Left hand abscess  PROCEDURE:  Procedure(s): IRRIGATION AND DEBRIDEMENT EXTREMITY (Left) hand  SURGEON:  Surgeon(s) and Role:    * Tami RibasKevin R Oneill Bais, MD - Primary  PHYSICIAN ASSISTANT:   ASSISTANTS: none   ANESTHESIA:   general  EBL:     BLOOD ADMINISTERED:none  DRAINS: iodoform packing  LOCAL MEDICATIONS USED:  MARCAINE     SPECIMEN:  Source of Specimen:  left hand  DISPOSITION OF SPECIMEN:  micro  COUNTS:  YES  TOURNIQUET:   Total Tourniquet Time Documented: Upper Arm (Left) - 23 minutes Total: Upper Arm (Left) - 23 minutes   DICTATION: .Other Dictation: Dictation Number [id number]  PLAN OF CARE: Discharge to home after PACU  PATIENT DISPOSITION:  PACU - hemodynamically stable.

## 2013-12-16 NOTE — Anesthesia Preprocedure Evaluation (Addendum)
Anesthesia Evaluation  Patient identified by MRN, date of birth, ID band Patient awake    Reviewed: Allergy & Precautions, H&P , NPO status , Patient's Chart, lab work & pertinent test results  History of Anesthesia Complications Negative for: history of anesthetic complications  Airway Mallampati: II TM Distance: >3 FB Neck ROM: Full    Dental  (+) Chipped, Dental Advisory Given   Pulmonary asthma , Current Smoker,    Pulmonary exam normal       Cardiovascular negative cardio ROS      Neuro/Psych PSYCHIATRIC DISORDERS Bipolar Disorder    GI/Hepatic negative GI ROS, Neg liver ROS,   Endo/Other  negative endocrine ROSMorbid obesity  Renal/GU negative Renal ROS     Musculoskeletal   Abdominal   Peds  Hematology   Anesthesia Other Findings   Reproductive/Obstetrics                         Anesthesia Physical Anesthesia Plan  ASA: III  Anesthesia Plan: General   Post-op Pain Management:    Induction: Intravenous  Airway Management Planned: LMA  Additional Equipment:   Intra-op Plan:   Post-operative Plan: Extubation in OR  Informed Consent: I have reviewed the patients History and Physical, chart, labs and discussed the procedure including the risks, benefits and alternatives for the proposed anesthesia with the patient or authorized representative who has indicated his/her understanding and acceptance.   Dental advisory given  Plan Discussed with: CRNA, Anesthesiologist and Surgeon  Anesthesia Plan Comments:        Anesthesia Quick Evaluation

## 2013-12-16 NOTE — Anesthesia Postprocedure Evaluation (Signed)
Anesthesia Post Note  Patient: Fred MossesJohn Fetters Jr.  Procedure(s) Performed: Procedure(s) (LRB): IRRIGATION AND DEBRIDEMENT HAND (Left)  Anesthesia type: general  Patient location: PACU  Post pain: Pain level controlled  Post assessment: Patient's Cardiovascular Status Stable  Last Vitals:  Filed Vitals:   12/16/13 2330  BP: 158/100  Pulse: 112  Temp:   Resp: 16    Post vital signs: Reviewed and stable  Level of consciousness: sedated  Complications: No apparent anesthesia complications
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
## 2013-12-16 NOTE — ED Notes (Signed)
Pt back from x-ray.
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
## 2013-12-16 NOTE — Anesthesia Procedure Notes (Signed)
Procedure Name: Intubation Date/Time: 12/16/2013 9:48 PM Performed by: Melina SchoolsBANKS, Makari Portman J Pre-anesthesia Checklist: Patient identified, Emergency Drugs available, Suction available and Patient being monitored Patient Re-evaluated:Patient Re-evaluated prior to inductionOxygen Delivery Method: Circle system utilized Preoxygenation: Pre-oxygenation with 100% oxygen Intubation Type: IV induction, Rapid sequence and Cricoid Pressure applied Ventilation: Mask ventilation without difficulty Laryngoscope Size: Mac and 3 Grade View: Grade II Tube type: Oral Tube size: 7.5 mm Number of attempts: 1 Airway Equipment and Method: Stylet Placement Confirmation: ETT inserted through vocal cords under direct vision,  positive ETCO2 and breath sounds checked- equal and bilateral Secured at: 23 cm Tube secured with: Tape Dental Injury: Teeth and Oropharynx as per pre-operative assessment

## 2013-12-16 NOTE — Discharge Instructions (Signed)

## 2013-12-16 NOTE — Op Note (Signed)
[id number] 

## 2013-12-16 NOTE — ED Notes (Signed)
Pt is here with left hand abscess that started swelling yesterday.  Pt now unable to move middle 2 fingers.

## 2013-12-16 NOTE — ED Notes (Signed)
Diet coke given °
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
## 2013-12-18 ENCOUNTER — Encounter (HOSPITAL_COMMUNITY): Payer: Self-pay | Admitting: Orthopedic Surgery

## 2013-12-20 LAB — CULTURE, ROUTINE-ABSCESS

## 2013-12-22 LAB — ANAEROBIC CULTURE

## 2014-03-18 ENCOUNTER — Encounter (HOSPITAL_COMMUNITY): Payer: Self-pay | Admitting: Emergency Medicine

## 2014-03-18 ENCOUNTER — Emergency Department (HOSPITAL_COMMUNITY)
Admission: EM | Admit: 2014-03-18 | Discharge: 2014-03-18 | Disposition: A | Discharge status: Home / Self Care | Payer: No Typology Code available for payment source | Attending: Emergency Medicine | Admitting: Emergency Medicine

## 2014-03-18 DIAGNOSIS — K0889 Other specified disorders of teeth and supporting structures: Secondary | ICD-10-CM

## 2014-03-18 DIAGNOSIS — Z8739 Personal history of other diseases of the musculoskeletal system and connective tissue: Secondary | ICD-10-CM | POA: Diagnosis not present

## 2014-03-18 DIAGNOSIS — R220 Localized swelling, mass and lump, head: Secondary | ICD-10-CM | POA: Insufficient documentation

## 2014-03-18 DIAGNOSIS — K088 Other specified disorders of teeth and supporting structures: Secondary | ICD-10-CM | POA: Diagnosis present

## 2014-03-18 DIAGNOSIS — Z8614 Personal history of Methicillin resistant Staphylococcus aureus infection: Secondary | ICD-10-CM | POA: Insufficient documentation

## 2014-03-18 DIAGNOSIS — J45909 Unspecified asthma, uncomplicated: Secondary | ICD-10-CM | POA: Insufficient documentation

## 2014-03-18 DIAGNOSIS — Z720 Tobacco use: Secondary | ICD-10-CM | POA: Diagnosis not present

## 2014-03-18 DIAGNOSIS — Z8781 Personal history of (healed) traumatic fracture: Secondary | ICD-10-CM | POA: Insufficient documentation

## 2014-03-18 DIAGNOSIS — Z8659 Personal history of other mental and behavioral disorders: Secondary | ICD-10-CM | POA: Diagnosis not present

## 2014-03-18 MED ORDER — NAPROXEN 500 MG PO TABS: 500.0000 mg | ORAL_TABLET | Freq: Two times a day (BID) | ORAL | Status: DC

## 2014-03-18 MED ORDER — HYDROCODONE-ACETAMINOPHEN 5-325 MG PO TABS: 1.0000 | ORAL_TABLET | Freq: Four times a day (QID) | ORAL | Status: DC | PRN

## 2014-03-18 MED ORDER — PENICILLIN V POTASSIUM 500 MG PO TABS: 500.0000 mg | ORAL_TABLET | Freq: Four times a day (QID) | ORAL | Status: AC

## 2014-03-18 NOTE — ED Notes (Signed)
Pt states that he has a dental abcess on his L side. Face swollen. Alert and oriented.
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
## 2014-03-18 NOTE — Discharge Instructions (Signed)
Recommend warm compresses to the left side of your face as well as naproxen for pain control. You may take Norco for severe pain. Take penicillin as prescribed. Do not stop antibiotics early.  Dental Abscess A dental abscess is a collection of infected fluid (pus) from a bacterial infection in the inner part of the tooth (pulp). It usually occurs at the end of the tooth's root.  CAUSES   Severe tooth decay.  Trauma to the tooth that allows bacteria to enter into the pulp, such as a broken or chipped tooth. SYMPTOMS   Severe pain in and around the infected tooth.  Swelling and redness around the abscessed tooth or in the mouth or face.  Tenderness.  Pus drainage.  Bad breath.  Bitter taste in the mouth.  Difficulty swallowing.  Difficulty opening the mouth.  Nausea.  Vomiting.  Chills.  Swollen neck glands. DIAGNOSIS   A medical and dental history will be taken.  An examination will be performed by tapping on the abscessed tooth.  X-rays may be taken of the tooth to identify the abscess. TREATMENT The goal of treatment is to eliminate the infection. You may be prescribed antibiotic medicine to stop the infection from spreading. A root canal may be performed to save the tooth. If the tooth cannot be saved, it may be pulled (extracted) and the abscess may be drained.  HOME CARE INSTRUCTIONS  Only take over-the-counter or prescription medicines for pain, fever, or discomfort as directed by your caregiver.  Rinse your mouth (gargle) often with salt water ( tsp salt in 8 oz [250 ml] of warm water) to relieve pain or swelling.  Do not drive after taking pain medicine (narcotics).  Do not apply heat to the outside of your face.  Return to your dentist for further treatment as directed. SEEK MEDICAL CARE IF:  Your pain is not helped by medicine.  Your pain is getting worse instead of better. SEEK IMMEDIATE MEDICAL CARE IF:  You have a fever or persistent symptoms  for more than 2-3 days.  You have a fever and your symptoms suddenly get worse.  You have chills or a very bad headache.  You have problems breathing or swallowing.  You have trouble opening your mouth.  You have swelling in the neck or around the eye. Document Released: 05/25/2005 Document Revised: 02/17/2012 Document Reviewed: 09/02/2010 [facility] Patient Information 2015 [place], [place]. This information is not intended to replace advice given to you by your health care provider. Make sure you discuss any questions you have with your health care provider.  Emergency Department Resource Guide 1) Find a Doctor and Pay Out of Pocket Although you won't have to find out who is covered by your insurance plan, it is a good idea to ask around and get recommendations. You will then need to call the office and see if the doctor you have chosen will accept you as a new patient and what types of options they offer for patients who are self-pay. Some doctors offer discounts or will set up payment plans for their patients who do not have insurance, but you will need to ask so you aren't surprised when you get to your appointment.  2) Contact Your Local Health Department Not all health departments have doctors that can see patients for sick visits, but many do, so it is worth a call to see if yours does. If you don't know where your local health department is, you can check in your phone book.  The CDC also has a tool to help you locate your state's health department, and many state websites also have listings of all of their local health departments.  3) Find a Walk-in Clinic If your illness is not likely to be very severe or complicated, you may want to try a walk in clinic. These are popping up all over the country in pharmacies, drugstores, and shopping centers. They're usually staffed by nurse practitioners or physician assistants that have been trained to treat common illnesses and complaints. They're  usually fairly quick and inexpensive. However, if you have serious medical issues or chronic medical problems, these are probably not your best option.  No Primary Care Doctor: - Call Health Connect at  [phone] - they can help you locate a primary care doctor that  accepts your insurance, provides certain services, etc. - Physician Referral Service- [phone]  Chronic Pain Problems: Organization         Address  Phone   Notes  [facility]  [phone] Patients need to be referred by their primary care doctor.   Medication Assistance: Organization         Address  Phone   Notes  [facility] Medication [facility] [address] [place] [place], [place] [address] [phone] --Must be a resident of [facility] -- Must have NO insurance coverage whatsoever (no Medicaid/ Medicare, etc.) -- The pt. MUST have a primary care doctor that directs their care regularly and follows them in the community   [facility]  [phone]   [employer]  [phone]    Agencies that provide inexpensive medical care: Organization         Address  Phone   Notes  [facility]  [phone]   [facility]    [phone]   [facility] [address] [place], [place] [address] [phone]   [facility] [address] [place] [phone]   Planned Parenthood    [phone]   [facility]    [phone]   Community Health and [facility]  [address], [place] Phone:  [phone], Fax:  [phone] Hours of Operation:  9 am - 6 pm, M-F.  Also accepts Medicaid/Medicare and self-pay.  [facility]  [address], [place] Phone: [phone], Fax: [phone]. Hours of Operation:  8:30 am - 5:30 pm, M-F.  Also accepts Medicaid and self-pay.  [facility] [place] [address] [place] Phone:  [phone]   [facility] [address] Natasha Bence [place], [place] [phone], Ext. [phone] Mondays & Thursdays: 7-9 AM.  First 15 patients are seen on a first come, first serve basis.    Medicaid-accepting [facility] Providers:  Organization         Address  Phone   Notes  [facility] [address], [place] [phone] Also accepts self-pay patients.  [facility] [address] Laurell Josephs [place], [place]  [phone]   [facility] [address], [place] [phone]   [facility] [address], [place] [phone]   Renaye Rakers [address], [place]   [phone] Only accepts [place] Access [place] patients after they have their name applied to their card.   Self-Pay (  no insurance) in [facility]:  Organization         Address  Phone   Notes  Sickle Cell Patients, [facility] [address], [place] [phone]) [phone]   [facility] [address], [place] [phone]   [facility]  [address], [place] [phone]   [facility]/Dr. Osei-Bonsu  [address] [place] [address], [place] or [phone], [place] [phone] Phone number for both [place] and [place] locations is the same.  Urgent Medical and [facility] [address], [place] [phone]) [phone]   [facility] [place] [address] [place] [address], [place] or [address] [phone]) [phone]   [facility] [address], [place] [phone](336) [phone], phone; [phone]) [phone], fax Sees patients 1st and 3rd Saturday of every month.  Must not qualify for public or private insurance (i.e. Medicaid, Medicare, [place] Health Choice, Veterans' Benefits)  Household income should be no more than 200% of the poverty level The clinic cannot treat  you if you are pregnant or think you are pregnant  Sexually transmitted diseases are not treated at the clinic.    Dental Care: Organization         Address  Phone  Notes  [facility] [address], [place] [phone]) [phone] Accepts children up to age 30 who are enrolled in [place] or [place] Health Choice; pregnant women with a Medicaid card; and children who have applied for Medicaid or [place] Health Choice, but were declined, whose parents can pay a reduced fee at time of service.  [facility] [place]  [address], [place] [phone]) [phone] Accepts children up to age 30 who are enrolled in [place] or [place] Health Choice; pregnant women with a Medicaid card; and children who have applied for Medicaid or [place] Health Choice, but were declined, whose parents can pay a reduced fee at time of service.  [facility]  [address], [place] [phone] Patients are seen by appointment only. Walk-ins are not accepted. [facility] will see patients 30 years of age and older. Monday - Tuesday (8am-5pm) Most Wednesdays (8:30-5pm) $30 per visit, cash only  [facility]  [address], [facility] [place] [phone] Patients are seen by appointment only. Walk-ins are not accepted. [facility] will see patients 30 years of age and older. One Wednesday Evening (Monthly: Volunteer Based).  $30 per visit, cash only  [employer] [facility] [facility]  [phone] for adults; Children under age 634, call Graduate Pediatric Dentistry at [phone]. Children aged 304-14, please call [phone] to request a pediatric application.  Dental services are provided in all areas of dental care including fillings, crowns and bridges, complete and partial dentures, implants, gum treatment, root canals, and extractions. Preventive care is also provided. Treatment  is provided to both adults and children. Patients are selected via a lottery and there is often a waiting list.   [facility] [address], [place]  [phone] [email or web address]   [facility] [address], [place], [place] [phone], Ext. [phone] Second and Fourth Thursday of each month, opens at 6:30 AM; Clinic ends at 9 AM.  Patients are seen on a first-come first-served basis, and a limited number are  seen during each clinic.   [facility]  [address] Ether Griffins [place], [place] [phone]   Eligibility Requirements You must have lived in [place], [place], or [place] counties for at least the last three months.   You cannot be eligible for state or federal sponsored [place], including [employer], [place], or [employer].   You generally cannot be eligible for healthcare insurance through your employer.    How to apply: Eligibility screenings are held every Tuesday and Wednesday afternoon from 1:00 pm until 4:00 pm. You do not need an appointment for the interview!  [facility] [address], [place], [place] [phone]   [facility] Health Department  [phone]   [facility] Health Department  [phone]   [facility] Health Department  [phone]    Behavioral Health Resources in the Community: Intensive Outpatient Programs Organization         Address  Phone  Notes  [facility] [address] [place], [place] [phone]   [facility] Outpatient [address], [place], [place] [phone]   [facility] [address], [place], [place]  [phone]   [facility] [address],  [place], [place] [phone] or [phone]   Substance Abuse Resources Organization         Address  Phone  Notes  [facility]  [phone]   [facility]  [phone]   The  [place]  [phone]   Floydene Flock  [phone]   Residential & Outpatient Substance Abuse Program  [phone]   Psychological Services Organization         Address  Phone  Notes  [facility]  [phone]   [facility]  [phone]   [facility] Mental [facility] [address], [place] [phone] or [phone]    Mobile Crisis Teams Organization         Address  Phone  Notes  [facility], Mobile Crisis Care Unit  [phone]   [facility]  [address] [place], [place] [phone]   Doristine Locks [address] [place] [phone]    Self-Help/Support Groups Organization         Address  Phone             Notes  [facility] [place] - variety of support groups  336- I7437963 Call for more information  Narcotics Anonymous (NA), [facility] [address], [employer] [place]  2 meetings at this location   Statistician         Address  Phone  Notes  [facility] [address],    [place]  [phone]   [facility]  [address], [place] [address], [place], [place] [phone]   [facility] [address] [place], [place] [phone] Admissions: 8am-3pm M-F  [facility] [address],    [place], [place] [phone]   [facility] [address] Starling Manns [place], [place] [phone]   [facility] [address],  [place], [place] [phone]   [facility] - Intensive Outpatient [address] Laurell Josephs [address], [place], [place] [phone]   [facility] ([facility] [address] [place].,  [place], [place] [phone] or [phone]   [facility] ([facility]) [address], [place], [place] [phone] Accepts Medicaid  [facility] [address]  [place] [phone] Substance Abuse/Addiction Treatment     [facility] Behavioral Health Resources Organization         Address  Phone  Notes  [facility]  [phone]   Angie FavaJulie Brannon, PhD [address], [address] [place], [place]   [phone] or [phone]) [phone]   [facility]   [address] [place], [place] [phone]) [phone]   [facility] [address], [place], [place] [phone](336) [phone] Insurance/Medicaid/sponsorship through [facility]  [facility] [address]                                    [place], [place] [phone]) [phone] Therapy/tele-psych/case  [facility] [address]   [place], [place] [phone]) [phone]    Dr. Lolly MustacheArfeen  [phone](336) [phone]   [facility]  [employer] [facility] 1) [address], [place] 2) [address], [place] 3)  [address], [place] [phone](336) [phone]) [phone]  [phone]) [phone]   [facility] Child Abuse Hotline [phone]) [phone] or [phone]) [phone] (After Hours)

## 2014-03-18 NOTE — ED Provider Notes (Signed)
Medical screening examination/treatment/procedure(s) were performed by non-physician practitioner and as supervising physician I was immediately available for consultation/collaboration.   EKG Interpretation None        David H Yao, MD 03/18/14 2342 

## 2014-03-23 ENCOUNTER — Emergency Department (HOSPITAL_COMMUNITY)
Admission: EM | Admit: 2014-03-23 | Discharge: 2014-03-23 | Disposition: A | Discharge status: Home / Self Care | Payer: No Typology Code available for payment source | Attending: Emergency Medicine | Admitting: Emergency Medicine

## 2014-03-23 ENCOUNTER — Encounter (HOSPITAL_COMMUNITY): Payer: Self-pay | Admitting: Emergency Medicine

## 2014-03-23 DIAGNOSIS — Z8739 Personal history of other diseases of the musculoskeletal system and connective tissue: Secondary | ICD-10-CM | POA: Diagnosis not present

## 2014-03-23 DIAGNOSIS — Z87828 Personal history of other (healed) physical injury and trauma: Secondary | ICD-10-CM | POA: Diagnosis not present

## 2014-03-23 DIAGNOSIS — Z8659 Personal history of other mental and behavioral disorders: Secondary | ICD-10-CM | POA: Diagnosis not present

## 2014-03-23 DIAGNOSIS — Z8614 Personal history of Methicillin resistant Staphylococcus aureus infection: Secondary | ICD-10-CM | POA: Diagnosis not present

## 2014-03-23 DIAGNOSIS — L02511 Cutaneous abscess of right hand: Secondary | ICD-10-CM | POA: Diagnosis present

## 2014-03-23 DIAGNOSIS — J45909 Unspecified asthma, uncomplicated: Secondary | ICD-10-CM | POA: Insufficient documentation

## 2014-03-23 DIAGNOSIS — Z720 Tobacco use: Secondary | ICD-10-CM | POA: Diagnosis not present

## 2014-03-23 DIAGNOSIS — L02519 Cutaneous abscess of unspecified hand: Secondary | ICD-10-CM

## 2014-03-23 MED ORDER — LIDOCAINE-EPINEPHRINE (PF) 2 %-1:200000 IJ SOLN
20.0000 mL | Freq: Once | INTRAMUSCULAR | Status: AC
  Administered 2014-03-23: 10 mL via INTRADERMAL
  Filled 2014-03-23: qty 20

## 2014-03-23 MED ORDER — MORPHINE SULFATE 4 MG/ML IJ SOLN
4.0000 mg | Freq: Once | INTRAMUSCULAR | Status: AC
  Administered 2014-03-23: 4 mg via INTRAMUSCULAR
  Filled 2014-03-23: qty 1

## 2014-03-23 MED ORDER — OXYCODONE-ACETAMINOPHEN 5-325 MG PO TABS: 2.0000 | ORAL_TABLET | Freq: Four times a day (QID) | ORAL | Status: DC | PRN

## 2014-03-23 MED ORDER — SULFAMETHOXAZOLE-TMP DS 800-160 MG PO TABS: 1.0000 | ORAL_TABLET | Freq: Two times a day (BID) | ORAL | Status: DC

## 2014-03-23 MED ORDER — OXYCODONE-ACETAMINOPHEN 5-325 MG PO TABS
2.0000 | ORAL_TABLET | Freq: Once | ORAL | Status: AC
  Administered 2014-03-23: 2 via ORAL
  Filled 2014-03-23: qty 2

## 2014-03-23 NOTE — ED Notes (Signed)
Pt NPO at this time.

## 2014-03-23 NOTE — ED Notes (Signed)
Swollen red lesion rt hand just noticed yesterday.  He is on penicillin for an abscessed tooth

## 2014-03-23 NOTE — Discharge Instructions (Signed)
Remove packing in am, irrigate wound bid and repack with iodoform gauze for 48 hours then d/c packing and irrigate until wound closed, keep wound covered with gauze.   Abscess An abscess is an infected area that contains a collection of pus and debris.It can occur in almost any part of the body. An abscess is also known as a furuncle or boil. CAUSES  An abscess occurs when tissue gets infected. This can occur from blockage of oil or sweat glands, infection of hair follicles, or a minor injury to the skin. As the body tries to fight the infection, pus collects in the area and creates pressure under the skin. This pressure causes pain. People with weakened immune systems have difficulty fighting infections and get certain abscesses more often.  SYMPTOMS Usually an abscess develops on the skin and becomes a painful mass that is red, warm, and tender. If the abscess forms under the skin, you may feel a moveable soft area under the skin. Some abscesses break open (rupture) on their own, but most will continue to get worse without care. The infection can spread deeper into the body and eventually into the bloodstream, causing you to feel ill.  DIAGNOSIS  Your caregiver will take your medical history and perform a physical exam. A sample of fluid may also be taken from the abscess to determine what is causing your infection. TREATMENT  Your caregiver may prescribe antibiotic medicines to fight the infection. However, taking antibiotics alone usually does not cure an abscess. Your caregiver may need to make a small cut (incision) in the abscess to drain the pus. In some cases, gauze is packed into the abscess to reduce pain and to continue draining the area. HOME CARE INSTRUCTIONS   Only take over-the-counter or prescription medicines for pain, discomfort, or fever as directed by your caregiver.  If you were prescribed antibiotics, take them as directed. Finish them even if you start to feel better.  If  gauze is used, follow your caregiver's directions for changing the gauze.  To avoid spreading the infection:  Keep your draining abscess covered with a bandage.  Wash your hands well.  Do not share personal care items, towels, or whirlpools with others.  Avoid skin contact with others.  Keep your skin and clothes clean around the abscess.  Keep all follow-up appointments as directed by your caregiver. SEEK MEDICAL CARE IF:   You have increased pain, swelling, redness, fluid drainage, or bleeding.  You have muscle aches, chills, or a general ill feeling.  You have a fever. MAKE SURE YOU:   Understand these instructions.  Will watch your condition.  Will get help right away if you are not doing well or get worse. Document Released: 03/04/2005 Document Revised: 11/24/2011 Document Reviewed: 08/07/2011 [facility] Patient Information 2015 [place]. This information is not intended to replace advice given to you by your health care provider. Make sure you discuss any questions you have with your health care provider.

## 2014-03-23 NOTE — ED Notes (Signed)
Pt st's he has been taking PCN for 5 days due to a abcess tooth.  Pt st's developed abscess on right hand on Wed and continued to get bigger.  No drainage noted

## 2014-03-23 NOTE — ED Notes (Signed)
[facility]
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
## 2014-03-23 NOTE — ED Notes (Signed)
Pt discharged home with all belongings, pt alert, oriented and ambulatory upon discharge. Pt verbalizes understanding of discharge instructions. Pt d/c home with 2 RX. Pt driven home by mother
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
## 2014-03-24 NOTE — ED Provider Notes (Signed)
Medical screening examination/treatment/procedure(s) were performed by non-physician practitioner and as supervising physician I was immediately available for consultation/collaboration.  Christopher J. Pollina, MD 03/24/14 1707 

## 2014-11-29 ENCOUNTER — Emergency Department (HOSPITAL_COMMUNITY)
Admission: EM | Admit: 2014-11-29 | Discharge: 2014-11-29 | Disposition: A | Discharge status: Home / Self Care | Payer: No Typology Code available for payment source | Attending: Emergency Medicine | Admitting: Emergency Medicine

## 2014-11-29 ENCOUNTER — Encounter (HOSPITAL_COMMUNITY): Payer: Self-pay | Admitting: Emergency Medicine

## 2014-11-29 DIAGNOSIS — Z8659 Personal history of other mental and behavioral disorders: Secondary | ICD-10-CM | POA: Insufficient documentation

## 2014-11-29 DIAGNOSIS — L02512 Cutaneous abscess of left hand: Secondary | ICD-10-CM | POA: Insufficient documentation

## 2014-11-29 DIAGNOSIS — Z87828 Personal history of other (healed) physical injury and trauma: Secondary | ICD-10-CM | POA: Insufficient documentation

## 2014-11-29 DIAGNOSIS — Z791 Long term (current) use of non-steroidal anti-inflammatories (NSAID): Secondary | ICD-10-CM | POA: Insufficient documentation

## 2014-11-29 DIAGNOSIS — Z8739 Personal history of other diseases of the musculoskeletal system and connective tissue: Secondary | ICD-10-CM | POA: Insufficient documentation

## 2014-11-29 DIAGNOSIS — Z8614 Personal history of Methicillin resistant Staphylococcus aureus infection: Secondary | ICD-10-CM | POA: Insufficient documentation

## 2014-11-29 DIAGNOSIS — Z8781 Personal history of (healed) traumatic fracture: Secondary | ICD-10-CM | POA: Insufficient documentation

## 2014-11-29 DIAGNOSIS — J45909 Unspecified asthma, uncomplicated: Secondary | ICD-10-CM | POA: Insufficient documentation

## 2014-11-29 DIAGNOSIS — L03114 Cellulitis of left upper limb: Secondary | ICD-10-CM | POA: Insufficient documentation

## 2014-11-29 DIAGNOSIS — Z720 Tobacco use: Secondary | ICD-10-CM | POA: Insufficient documentation

## 2014-11-29 MED ORDER — CEPHALEXIN 500 MG PO CAPS: 500.0000 mg | ORAL_CAPSULE | Freq: Four times a day (QID) | ORAL | Status: DC

## 2014-11-29 MED ORDER — HYDROCODONE-ACETAMINOPHEN 5-325 MG PO TABS
1.0000 | ORAL_TABLET | Freq: Once | ORAL | Status: AC
  Administered 2014-11-29: 1 via ORAL
  Filled 2014-11-29: qty 1

## 2014-11-29 MED ORDER — CEPHALEXIN 250 MG PO CAPS
500.0000 mg | ORAL_CAPSULE | Freq: Once | ORAL | Status: AC
  Administered 2014-11-29: 500 mg via ORAL
  Filled 2014-11-29: qty 2

## 2014-11-29 MED ORDER — LIDOCAINE-EPINEPHRINE (PF) 2 %-1:200000 IJ SOLN
20.0000 mL | Freq: Once | INTRAMUSCULAR | Status: AC
  Administered 2014-11-29: 20 mL
  Filled 2014-11-29: qty 20

## 2014-11-29 MED ORDER — SULFAMETHOXAZOLE-TRIMETHOPRIM 800-160 MG PO TABS: 1.0000 | ORAL_TABLET | Freq: Two times a day (BID) | ORAL | Status: AC

## 2014-11-29 MED ORDER — HYDROCODONE-ACETAMINOPHEN 5-325 MG PO TABS: 1.0000 | ORAL_TABLET | ORAL | Status: DC | PRN

## 2014-11-29 MED ORDER — SULFAMETHOXAZOLE-TRIMETHOPRIM 800-160 MG PO TABS
1.0000 | ORAL_TABLET | Freq: Once | ORAL | Status: AC
  Administered 2014-11-29: 1 via ORAL
  Filled 2014-11-29: qty 1

## 2014-11-29 NOTE — ED Notes (Signed)
Pt. reports abscess at left hand onset this week with no drainage .
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
## 2014-11-29 NOTE — Discharge Instructions (Signed)
Read the information below.  Use the prescribed medication as directed.  Please discuss all new medications with your pharmacist.  Do not take additional tylenol while taking the prescribed pain medication to avoid overdose.  You may return to the Emergency Department at any time for worsening condition or any new symptoms that concern you.   If you develop increased redness, swelling, uncontrolled pain, difficulty moving your fingers, or fevers greater than 100.4, return to the ER immediately for a recheck.      Abscess Care After An abscess (also called a boil or furuncle) is an infected area that contains a collection of pus. Signs and symptoms of an abscess include pain, tenderness, redness, or hardness, or you may feel a moveable soft area under your skin. An abscess can occur anywhere in the body. The infection may spread to surrounding tissues causing cellulitis. A cut (incision) by the surgeon was made over your abscess and the pus was drained out. Gauze may have been packed into the space to provide a drain that will allow the cavity to heal from the inside outwards. The boil may be painful for 5 to 7 days. Most people with a boil do not have high fevers. Your abscess, if seen early, may not have localized, and may not have been lanced. If not, another appointment may be required for this if it does not get better on its own or with medications. HOME CARE INSTRUCTIONS   Only take over-the-counter or prescription medicines for pain, discomfort, or fever as directed by your caregiver.  When you bathe, soak and then remove gauze or iodoform packs at least daily or as directed by your caregiver. You may then wash the wound gently with mild soapy water. Repack with gauze or do as your caregiver directs. SEEK IMMEDIATE MEDICAL CARE IF:   You develop increased pain, swelling, redness, drainage, or bleeding in the wound site.  You develop signs of generalized infection including muscle aches,  chills, fever, or a general ill feeling.  An oral temperature above 102 F (38.9 C) develops, not controlled by medication. See your caregiver for a recheck if you develop any of the symptoms described above. If medications (antibiotics) were prescribed, take them as directed. Document Released: 12/11/2004 Document Revised: 08/17/2011 Document Reviewed: 08/08/2007 [facility] Patient Information 2015 [place], [place]. This information is not intended to replace advice given to you by your health care provider. Make sure you discuss any questions you have with your health care provider.  Abscess An abscess is an infected area that contains a collection of pus and debris.It can occur in almost any part of the body. An abscess is also known as a furuncle or boil. CAUSES  An abscess occurs when tissue gets infected. This can occur from blockage of oil or sweat glands, infection of hair follicles, or a minor injury to the skin. As the body tries to fight the infection, pus collects in the area and creates pressure under the skin. This pressure causes pain. People with weakened immune systems have difficulty fighting infections and get certain abscesses more often.  SYMPTOMS Usually an abscess develops on the skin and becomes a painful mass that is red, warm, and tender. If the abscess forms under the skin, you may feel a moveable soft area under the skin. Some abscesses break open (rupture) on their own, but most will continue to get worse without care. The infection can spread deeper into the body and eventually into the bloodstream, causing you to  feel ill.  DIAGNOSIS  Your caregiver will take your medical history and perform a physical exam. A sample of fluid may also be taken from the abscess to determine what is causing your infection. TREATMENT  Your caregiver may prescribe antibiotic medicines to fight the infection. However, taking antibiotics alone usually does not cure an abscess. Your caregiver  may need to make a small cut (incision) in the abscess to drain the pus. In some cases, gauze is packed into the abscess to reduce pain and to continue draining the area. HOME CARE INSTRUCTIONS   Only take over-the-counter or prescription medicines for pain, discomfort, or fever as directed by your caregiver.  If you were prescribed antibiotics, take them as directed. Finish them even if you start to feel better.  If gauze is used, follow your caregiver's directions for changing the gauze.  To avoid spreading the infection:  Keep your draining abscess covered with a bandage.  Wash your hands well.  Do not share personal care items, towels, or whirlpools with others.  Avoid skin contact with others.  Keep your skin and clothes clean around the abscess.  Keep all follow-up appointments as directed by your caregiver. SEEK MEDICAL CARE IF:   You have increased pain, swelling, redness, fluid drainage, or bleeding.  You have muscle aches, chills, or a general ill feeling.  You have a fever. MAKE SURE YOU:   Understand these instructions.  Will watch your condition.  Will get help right away if you are not doing well or get worse. Document Released: 03/04/2005 Document Revised: 11/24/2011 Document Reviewed: 08/07/2011 [facility] Patient Information 2015 [place], [place]. This information is not intended to replace advice given to you by your health care provider. Make sure you discuss any questions you have with your health care provider.  Cellulitis Cellulitis is an infection of the skin and the tissue beneath it. The infected area is usually red and tender. Cellulitis occurs most often in the arms and lower legs.  CAUSES  Cellulitis is caused by bacteria that enter the skin through cracks or cuts in the skin. The most common types of bacteria that cause cellulitis are staphylococci and streptococci. SIGNS AND SYMPTOMS   Redness and warmth.  Swelling.  Tenderness or  pain.  Fever. DIAGNOSIS  Your health care provider can usually determine what is wrong based on a physical exam. Blood tests may also be done. TREATMENT  Treatment usually involves taking an antibiotic medicine. HOME CARE INSTRUCTIONS   Take your antibiotic medicine as directed by your health care provider. Finish the antibiotic even if you start to feel better.  Keep the infected arm or leg elevated to reduce swelling.  Apply a warm cloth to the affected area up to 4 times per day to relieve pain.  Take medicines only as directed by your health care provider.  Keep all follow-up visits as directed by your health care provider. SEEK MEDICAL CARE IF:   You notice red streaks coming from the infected area.  Your red area gets larger or turns dark in color.  Your bone or joint underneath the infected area becomes painful after the skin has healed.  Your infection returns in the same area or another area.  You notice a swollen bump in the infected area.  You develop new symptoms.  You have a fever. SEEK IMMEDIATE MEDICAL CARE IF:   You feel very sleepy.  You develop vomiting or diarrhea.  You have a general ill feeling (malaise) with muscle aches  and pains. MAKE SURE YOU:   Understand these instructions.  Will watch your condition.  Will get help right away if you are not doing well or get worse. Document Released: 03/04/2005 Document Revised: 10/09/2013 Document Reviewed: 08/10/2011 [facility] Patient Information 2015 [place], [place]. This information is not intended to replace advice given to you by your health care provider. Make sure you discuss any questions you have with your health care provider.

## 2015-01-26 ENCOUNTER — Encounter (HOSPITAL_COMMUNITY): Payer: Self-pay | Admitting: Nurse Practitioner

## 2015-01-26 ENCOUNTER — Emergency Department (HOSPITAL_COMMUNITY)
Admission: EM | Admit: 2015-01-26 | Discharge: 2015-01-27 | Disposition: A | Discharge status: Other Acute Inpatient Hospital | Payer: No Typology Code available for payment source | Attending: Emergency Medicine | Admitting: Emergency Medicine

## 2015-01-26 DIAGNOSIS — Y998 Other external cause status: Secondary | ICD-10-CM | POA: Insufficient documentation

## 2015-01-26 DIAGNOSIS — Z8614 Personal history of Methicillin resistant Staphylococcus aureus infection: Secondary | ICD-10-CM | POA: Insufficient documentation

## 2015-01-26 DIAGNOSIS — T24232A Burn of second degree of left lower leg, initial encounter: Secondary | ICD-10-CM | POA: Insufficient documentation

## 2015-01-26 DIAGNOSIS — T24231A Burn of second degree of right lower leg, initial encounter: Secondary | ICD-10-CM | POA: Insufficient documentation

## 2015-01-26 DIAGNOSIS — X04XXXA Exposure to ignition of highly flammable material, initial encounter: Secondary | ICD-10-CM | POA: Insufficient documentation

## 2015-01-26 DIAGNOSIS — Z720 Tobacco use: Secondary | ICD-10-CM | POA: Insufficient documentation

## 2015-01-26 DIAGNOSIS — T300 Burn of unspecified body region, unspecified degree: Secondary | ICD-10-CM

## 2015-01-26 DIAGNOSIS — Z8781 Personal history of (healed) traumatic fracture: Secondary | ICD-10-CM | POA: Insufficient documentation

## 2015-01-26 DIAGNOSIS — T2026XA Burn of second degree of forehead and cheek, initial encounter: Secondary | ICD-10-CM | POA: Insufficient documentation

## 2015-01-26 DIAGNOSIS — T22211A Burn of second degree of right forearm, initial encounter: Secondary | ICD-10-CM | POA: Insufficient documentation

## 2015-01-26 DIAGNOSIS — T22212A Burn of second degree of left forearm, initial encounter: Secondary | ICD-10-CM | POA: Insufficient documentation

## 2015-01-26 DIAGNOSIS — Z8739 Personal history of other diseases of the musculoskeletal system and connective tissue: Secondary | ICD-10-CM | POA: Insufficient documentation

## 2015-01-26 DIAGNOSIS — J45909 Unspecified asthma, uncomplicated: Secondary | ICD-10-CM | POA: Insufficient documentation

## 2015-01-26 DIAGNOSIS — Z8659 Personal history of other mental and behavioral disorders: Secondary | ICD-10-CM | POA: Insufficient documentation

## 2015-01-26 DIAGNOSIS — Y9389 Activity, other specified: Secondary | ICD-10-CM | POA: Insufficient documentation

## 2015-01-26 DIAGNOSIS — Y9289 Other specified places as the place of occurrence of the external cause: Secondary | ICD-10-CM | POA: Insufficient documentation

## 2015-01-26 LAB — BASIC METABOLIC PANEL
ANION GAP: 12 (ref 5–15)
BUN: 5 mg/dL — ABNORMAL LOW (ref 6–20)
CALCIUM: 9.6 mg/dL (ref 8.9–10.3)
CO2: 26 mmol/L (ref 22–32)
Chloride: 102 mmol/L (ref 101–111)
Creatinine, Ser: 0.97 mg/dL (ref 0.61–1.24)
GFR calc Af Amer: >60 mL/min (ref >60)
GFR calc non Af Amer: >60 mL/min (ref >60)
Glucose, Bld: 91 mg/dL (ref 65–99)
POTASSIUM: 3.6 mmol/L (ref 3.5–5.1)
Sodium: 140 mmol/L (ref 135–145)

## 2015-01-26 LAB — CBC WITH DIFFERENTIAL/PLATELET
BASOS PCT: 0 % (ref 0–1)
Basophils Absolute: 0 10*3/uL (ref 0.0–0.1)
Eosinophils Absolute: 0.1 10*3/uL (ref 0.0–0.7)
Eosinophils Relative: 1 % (ref 0–5)
HEMATOCRIT: 47.6 % (ref 39.0–52.0)
Hemoglobin: 16.7 g/dL (ref 13.0–17.0)
Lymphocytes Relative: 13 % (ref 12–46)
Lymphs Abs: 1.6 10*3/uL (ref 0.7–4.0)
MCH: 32.3 pg (ref 26.0–34.0)
MCHC: 35.1 g/dL (ref 30.0–36.0)
MCV: 92.1 fL (ref 78.0–100.0)
MONO ABS: 1 10*3/uL (ref 0.1–1.0)
Monocytes Relative: 8 % (ref 3–12)
NEUTROS ABS: 10.4 10*3/uL — AB (ref 1.7–7.7)
Neutrophils Relative %: 78 % — ABNORMAL HIGH (ref 43–77)
Platelets: 194 10*3/uL (ref 150–400)
RBC: 5.17 MIL/uL (ref 4.22–5.81)
RDW: 14.5 % (ref 11.5–15.5)
WBC: 13.2 10*3/uL — ABNORMAL HIGH (ref 4.0–10.5)

## 2015-01-26 LAB — CK: CK TOTAL: 133 U/L (ref 49–397)

## 2015-01-26 MED ORDER — SILVER SULFADIAZINE 1 % EX CREA
TOPICAL_CREAM | Freq: Once | CUTANEOUS | Status: AC
  Administered 2015-01-26: 23:00:00 via TOPICAL
  Filled 2015-01-26: qty 85

## 2015-01-26 MED ORDER — HYDROMORPHONE HCL 1 MG/ML IJ SOLN
1.0000 mg | Freq: Once | INTRAMUSCULAR | Status: AC
  Administered 2015-01-26: 1 mg via INTRAVENOUS
  Filled 2015-01-26: qty 1

## 2015-01-26 MED ORDER — OXYCODONE-ACETAMINOPHEN 5-325 MG PO TABS
1.0000 | ORAL_TABLET | Freq: Once | ORAL | Status: AC
  Administered 2015-01-26: 1 via ORAL
  Filled 2015-01-26: qty 1
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
## 2015-01-26 NOTE — ED Notes (Signed)
Dr. Anitra Lauth and Dr. Essie Christine at bedside at this time

## 2015-01-26 NOTE — ED Notes (Signed)
This RN attempted IV access.  Unable to obtain.  Second RN to attempt.
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
## 2015-01-26 NOTE — ED Notes (Signed)
Pt is expressing frustration with his care.  Pt sts "I have been here four hours and you haven't done anything for me".  This RN had a therapeutic conversation with patient, allowing him to express his concerns.  Patient continues to be frustrated, but appreciated RN's attempts.
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
## 2015-01-27 MED ORDER — HYDROMORPHONE HCL 1 MG/ML IJ SOLN
1.0000 mg | INTRAMUSCULAR | Status: DC | PRN
  Administered 2015-01-27: 1 mg via INTRAVENOUS
  Filled 2015-01-27: qty 1

## 2015-04-18 ENCOUNTER — Encounter (HOSPITAL_COMMUNITY): Payer: Self-pay | Admitting: Emergency Medicine

## 2015-04-18 ENCOUNTER — Encounter (HOSPITAL_COMMUNITY): Payer: Self-pay

## 2015-04-18 ENCOUNTER — Emergency Department (HOSPITAL_COMMUNITY)
Admission: EM | Admit: 2015-04-18 | Discharge: 2015-04-18 | Disposition: A | Discharge status: Home / Self Care | Payer: No Typology Code available for payment source | Attending: Emergency Medicine | Admitting: Emergency Medicine

## 2015-04-18 ENCOUNTER — Emergency Department (HOSPITAL_COMMUNITY)
Admission: EM | Admit: 2015-04-18 | Discharge: 2015-04-19 | Disposition: A | Discharge status: Other Acute Inpatient Hospital | Payer: No Typology Code available for payment source | Attending: Emergency Medicine | Admitting: Emergency Medicine

## 2015-04-18 DIAGNOSIS — Z8659 Personal history of other mental and behavioral disorders: Secondary | ICD-10-CM | POA: Insufficient documentation

## 2015-04-18 DIAGNOSIS — F1210 Cannabis abuse, uncomplicated: Secondary | ICD-10-CM | POA: Insufficient documentation

## 2015-04-18 DIAGNOSIS — F17210 Nicotine dependence, cigarettes, uncomplicated: Secondary | ICD-10-CM | POA: Insufficient documentation

## 2015-04-18 DIAGNOSIS — Z791 Long term (current) use of non-steroidal anti-inflammatories (NSAID): Secondary | ICD-10-CM | POA: Insufficient documentation

## 2015-04-18 DIAGNOSIS — Z8614 Personal history of Methicillin resistant Staphylococcus aureus infection: Secondary | ICD-10-CM | POA: Insufficient documentation

## 2015-04-18 DIAGNOSIS — J45909 Unspecified asthma, uncomplicated: Secondary | ICD-10-CM | POA: Insufficient documentation

## 2015-04-18 DIAGNOSIS — Z87828 Personal history of other (healed) physical injury and trauma: Secondary | ICD-10-CM | POA: Insufficient documentation

## 2015-04-18 DIAGNOSIS — K029 Dental caries, unspecified: Secondary | ICD-10-CM | POA: Insufficient documentation

## 2015-04-18 DIAGNOSIS — M109 Gout, unspecified: Secondary | ICD-10-CM | POA: Insufficient documentation

## 2015-04-18 DIAGNOSIS — Z8781 Personal history of (healed) traumatic fracture: Secondary | ICD-10-CM | POA: Insufficient documentation

## 2015-04-18 DIAGNOSIS — F1990 Other psychoactive substance use, unspecified, uncomplicated: Secondary | ICD-10-CM

## 2015-04-18 DIAGNOSIS — Z792 Long term (current) use of antibiotics: Secondary | ICD-10-CM | POA: Insufficient documentation

## 2015-04-18 DIAGNOSIS — F11220 Opioid dependence with intoxication, uncomplicated: Secondary | ICD-10-CM | POA: Insufficient documentation

## 2015-04-18 DIAGNOSIS — F1110 Opioid abuse, uncomplicated: Secondary | ICD-10-CM | POA: Insufficient documentation

## 2015-04-18 LAB — COMPREHENSIVE METABOLIC PANEL
ALBUMIN: 4 g/dL (ref 3.5–5.0)
ALK PHOS: 62 U/L (ref 38–126)
ALT: 18 U/L (ref 17–63)
AST: 20 U/L (ref 15–41)
Anion gap: 8 (ref 5–15)
BUN: 8 mg/dL (ref 6–20)
CALCIUM: 8.9 mg/dL (ref 8.9–10.3)
CO2: 26 mmol/L (ref 22–32)
CREATININE: 0.93 mg/dL (ref 0.61–1.24)
Chloride: 104 mmol/L (ref 101–111)
GFR calc Af Amer: >60 mL/min (ref >60)
GFR calc non Af Amer: >60 mL/min (ref >60)
GLUCOSE: 101 mg/dL — AB (ref 65–99)
Potassium: 3.5 mmol/L (ref 3.5–5.1)
SODIUM: 138 mmol/L (ref 135–145)
Total Bilirubin: 0.8 mg/dL (ref 0.3–1.2)
Total Protein: 7.3 g/dL (ref 6.5–8.1)

## 2015-04-18 LAB — RAPID URINE DRUG SCREEN, HOSP PERFORMED
Amphetamines: NOT DETECTED
BARBITURATES: NOT DETECTED
Benzodiazepines: NOT DETECTED
COCAINE: NOT DETECTED
Opiates: POSITIVE — AB
TETRAHYDROCANNABINOL: POSITIVE — AB

## 2015-04-18 LAB — CBC
HEMATOCRIT: 40.5 % (ref 39.0–52.0)
HEMOGLOBIN: 13.9 g/dL (ref 13.0–17.0)
MCH: 31.1 pg (ref 26.0–34.0)
MCHC: 34.3 g/dL (ref 30.0–36.0)
MCV: 90.6 fL (ref 78.0–100.0)
Platelets: 223 10*3/uL (ref 150–400)
RBC: 4.47 MIL/uL (ref 4.22–5.81)
RDW: 14.1 % (ref 11.5–15.5)
WBC: 9.2 10*3/uL (ref 4.0–10.5)

## 2015-04-18 LAB — SALICYLATE LEVEL: Salicylate Lvl: <4 mg/dL (ref 2.8–30.0)

## 2015-04-18 LAB — ACETAMINOPHEN LEVEL

## 2015-04-18 LAB — ETHANOL: Alcohol, Ethyl (B): <5 mg/dL (ref <5)

## 2015-04-18 MED ORDER — PENICILLIN V POTASSIUM 500 MG PO TABS: 500.0000 mg | ORAL_TABLET | Freq: Once | ORAL | Status: DC

## 2015-04-18 NOTE — Progress Notes (Addendum)
[facility] received call from [name] stating that he wants help  "I'm afraid if I don't get help I will brake something and hurt myself and my family".  Caller contracted for safety and reported that he is not going to hurt himself or his family physically.  Caller tearful: "I can't get up out of bed in the morning, I am depressed and I need help." Writer provided support.  Caller said that he wanted to talk to somebody. Writer advised that he could come as a walk-in but that "I can't guarantee that you will get a bed at [facility]". Support provided. Caller reported that [facility].  Fred Reynolds, LCSWA Disposition staff 04/18/2015 8:01 PM

## 2015-04-18 NOTE — ED Notes (Signed)
Pt has 2 hospital issued belongings bags and 1 personal red[place]black [place] state bookbag. All behind triage nurses station, checked and wanded by security.
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
## 2015-04-18 NOTE — ED Notes (Signed)
Pt changing into paper scrubs
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
## 2015-04-18 NOTE — ED Notes (Addendum)
[facility]  requesting detox from Opiods. He reports he occasionally has homicidal ideations without a plan. Pt reports last use of heroin (injection) was today. He denies physical pain. Pt is alert and oriented. He is calm and cooperative. [facility] once medically cleared.

## 2015-04-18 NOTE — Discharge Instructions (Signed)
Do not hesitate to return to the emergency room for any new, worsening or concerning symptoms.  Please obtain primary care using resource guide below. Let them know that you were seen in the emergency room and that they will need to obtain records for further outpatient management.   Finding Treatment for Addiction WHAT IS ADDICTION? Addiction is a complex disease of the brain. It causes an uncontrollable (compulsive) need for a substance. You can be addicted to alcohol, illegal drugs, or prescription medicines such as painkillers. Addiction can also be a behavior, like gambling or shopping. The need for the drug or activity can become so strong that you think about it all the time. You can also become physically dependent on a substance. Addiction can change the way your brain works. Because of these changes, getting more of whatever you are addicted to becomes the most important thing to you and feels better than other activities or relationships. Addiction can lead to changes in health, behavior, emotions, relationships, and choices that affect you and everyone around you. HOW DO I KNOW IF I NEED TREATMENT FOR ADDICTION? Addiction is a progressive disease. Without treatment, addiction can get worse. Living with addiction puts you at higher risk for injury, poor health, lost employment, loss of money, and even death. You might need treatment for addiction if:  You have tried to stop or cut down, but you cannot.  Your addiction is causing physical health problems.  You find it annoying that your friends and family are concerned about your alcohol or substance use.  You feel guilty about substance abuse or a compulsive behavior.  You have lied or tried to hide your addiction.  You need a particular substance or activity to start your day or to calm down.  You are getting in trouble at school, work, home, or with the police.  You have done something illegal to support your addiction.  You  are running out of money because of your addiction.  You have no time for anything other than your addiction. WHAT TYPES OF TREATMENT ARE AVAILABLE? The treatment program that is right for you will depend on many factors, including the type of addiction you have. Treatment programs can be outpatient or inpatient. In an outpatient program, you live at home and go to work or school, but you also go to a clinic for treatment. With an inpatient program, you live and sleep at the program facility during treatment. After treatment, you might need a plan for support during recovery. Other treatment options include:   Medicine.  Some addictions may be treated with prescription medicines.  You might also need medicine to treat anxiety or depression.  Counseling and behavior therapy. Therapy can help individuals and families behave in healthier ways and relate more effectively.  Support groups. Confidential group therapy, such as a 12-step program, can help individuals and families during treatment and recovery. No single type of program is right for everyone. Many treatment programs involve a combination of education, counseling, and a 12-step, spiritually-based approach. Some treatment programs are government sponsored. They are geared for patients who do not have private insurance. Treatment programs can vary in many respects, such as:  Cost and types of insurance that are accepted.  Types of on-site medical services that are offered.  Length of stay, setting, and size.  Overall philosophy of treatment. WHAT SHOULD I CONSIDER WHEN SELECTING A TREATMENT PROGRAM? It is important to think about your individual requirements when selecting a treatment program. There  are a number of things to consider, such as:  If the program is certified by the appropriate government agency. Even private programs must be certified and employ certified professionals.  If the program is covered by your insurance. If  finances are a concern, the first call you should make is to your insurance company, if you have health insurance. Ask for a list of treatment programs that are in your network, and confirm any copayments and deductibles that you may have to pay.  If you do not have insurance, or if you choose to attend a program that does not accept your insurance, discuss whether a payment plan can be set up.  If treatment is available in languages other than English, if needed.  If the program offers detoxification treatment, if needed.  If 12-step meetings are held at the center or if transport is available for patients to attend meetings at other locations.  If the program is professional, organized, and clean.  If the program meets all of your needs, including physical and cultural needs.  If the facility offers specific treatment for your particular addiction.  If support continues to be offered after you have left the program.  If your treatment plan is continually looked at to make sure you are receiving the right treatment at the right time.  If mental health counseling is part of your treatment.  If medicine is included in treatment, if needed.  If your family is included in your treatment plan and if support is offered to them throughout the treatment process.  How the treatment works to prevent relapse. WHERE ELSE CAN I GET HELP?  Your health care provider. Ask him or her to help you find addiction treatment. These discussions are confidential.  The [employer] Council on Alcoholism and Drug Dependence (NCADD). This group has information about treatment centers and programs for people who have an addiction and for family members.  The telephone number is 1-800-NCA-CALL (519-124-36241-640-509-7298).  The website is https://ncadd.org/about-ncadd/our-affiliates  The Substance Abuse and Mental Health Services Administration [facility](SAMHSA). This group will help you find publicly funded treatment centers, help  hotlines, and counseling services near you.  The telephone number is 1-800-662-HELP (306-184-41811-(214) 419-0823).  The website is www.findtreatment.RockToxic.plsamhsa.gov In countries outside of the [place] and [place], look in M.D.C. Holdingslocal directories for contact information for services in your area.   This information is not intended to replace advice given to you by your health care provider. Make sure you discuss any questions you have with your health care provider.   Document Released: 04/23/2005 Document Revised: 02/13/2015 Document Reviewed: 03/13/2014 Elsevier Interactive Patient Education 2016 [employer]   Emergency Department Resource Guide 1) Find a Doctor and Pay Out of Pocket Although you won't have to find out who is covered by your insurance plan, it is a good idea to ask around and get recommendations. You will then need to call the office and see if the doctor you have chosen will accept you as a new patient and what types of options they offer for patients who are self-pay. Some doctors offer discounts or will set up payment plans for their patients who do not have insurance, but you will need to ask so you aren't surprised when you get to your appointment.  2) Contact Your Local Health Department Not all health departments have doctors that can see patients for sick visits, but many do, so it is worth a call to see if yours does. If you don't know where your local  health department is, you can check in your phone book. The CDC also has a tool to help you locate your state's health department, and many state websites also have listings of all of their local health departments.  3) Find a Walk-in Clinic If your illness is not likely to be very severe or complicated, you may want to try a walk in clinic. These are popping up all over the country in pharmacies, drugstores, and shopping centers. They're usually staffed by nurse practitioners or physician assistants that have been trained to treat common  illnesses and complaints. They're usually fairly quick and inexpensive. However, if you have serious medical issues or chronic medical problems, these are probably not your best option.  No Primary Care Doctor: - Call Health Connect at  [phone] - they can help you locate a primary care doctor that  accepts your insurance, provides certain services, etc. - Physician Referral Service- [phone]  Chronic Pain Problems: Organization         Address  Phone   Notes  [facility]  [phone] Patients need to be referred by their primary care doctor.   Medication Assistance: Organization         Address  Phone   Notes  [facility] Medication [facility] [address] [employer] [place], [place] [address] [phone] --Must be a resident of [facility] -- Must have NO insurance coverage whatsoever (no Medicaid/ Medicare, etc.) -- The pt. MUST have a primary care doctor that directs their care regularly and follows them in the community   MedAssist  [phone]   [employer]  [phone]    Agencies that provide inexpensive medical care: Organization         Address  Phone   Notes  [facility]  [phone]   [facility]    [phone]   [facility] [address] [place], [place] [address] [phone]   [facility] [address], [place] [phone]   Planned Parenthood    [phone]   [facility]    [phone]   [facility]  [address], [place] Phone:  [phone], Fax:  [phone] Hours of Operation:  9 am - 6 pm, M-F.  Also accepts Medicaid/Medicare and self-pay.  [facility] [place] [facility]  [address], [place] Phone: [phone], Fax: [phone]. Hours of Operation:  8:30 am - 5:30 pm, M-F.  Also accepts Medicaid and self-pay.  [facility]  [address], [place] Phone: [phone]   [facility] [address] Natasha Bence [place], [place] [phone], Ext. [phone] Mondays & Thursdays: 7-9 AM.  First 15 patients are seen on a first come, first serve basis.    Medicaid-accepting [facility] Providers:  Organization         Address  Phone   Notes  [facility] [address],  [phone] Also accepts self-pay patients.  [facility] [address] Laurell Josephs [place], [place]  [phone]   [facility] [address], [place] [phone]   [facility] [address] [place] [phone]   Renaye Rakers [address], [place]   [phone] Only accepts [place] Access [place] patients after they  have their name applied to their card.   Self-Pay (no insurance) in [facility]:  Organization         Address  Phone   Notes  Sickle Cell Patients, [facility] [place] [phone]   [facility] [place] [phone]   [facility] [place]  [place], [place], [place] [phone]   [facility]/Dr. Osei-Bonsu  [address], [place] or [address], [place] [phone] Phone number for both [place] and [place] locations is the same.  [facility] [address] [place] [phone]   [facility] [address] [place] or [address] [phone]   [facility] [address] [place] [phone], phone; [phone], fax Sees patients 1st and 3rd Saturday of every month.  Must not qualify for public or private insurance (i.e. Medicaid, Medicare, [place] Health Choice, Veterans' Benefits)  Household income should be no more than 200% of the  poverty level The clinic cannot treat you if you are pregnant or think you are pregnant  Sexually transmitted diseases are not treated at the clinic.    Dental Care: Organization         Address  Phone  Notes  [facility] of [facility] [place] [phone] Accepts children up to age 47 who are enrolled in [place] or [place]; pregnant women with a Medicaid card; and children who have applied for Medicaid or [place] Health Choice, but were declined, whose parents can pay a reduced fee at time of service.  [facility] Department of [facility]  [address], [place] [phone] Accepts children up to age 62 who are enrolled in [place] or [place]; pregnant women with a Medicaid card; and children who have applied for Medicaid or  Health Choice, but were declined, whose parents can pay a reduced fee at time of service.  [facility] Access PRO[facility]  [place] [phone] Patients are seen by appointment only. Walk-ins are not accepted. [place] will see patients 34 years of age and older. Monday - Tuesday (8am-5pm) Most Wednesdays (8:30-5pm) $30 per visit, cash only  [facility] Adult Dental Access PROGRAM  [address], [facility] [phone] Patients are seen by appointment only. Walk-ins are not accepted. [place] will see patients 68 years of age and older. One Wednesday Evening (Monthly: Volunteer Based).  $30 per visit, cash only  [place]  [phone] for adults; Children under age 55, call Graduate Pediatric Dentistry at [phone]. Children aged 88-14, please call [phone] to request a pediatric application.  Dental services are provided in all areas of dental care including fillings, crowns and bridges, complete and partial dentures, implants, gum treatment, root canals, and extractions.  Preventive care is also provided. Treatment is provided to both adults and children. Patients are selected via a lottery and there is often a waiting list.   [facility] [address] [place]  [phone] www.drcivils.[place], [place], [place] [phone], Ext. [phone] Second and Fourth Thursday of each month, opens at 6:30 AM; Clinic ends at 9 AM.  Patients are seen  on a first-come first-served basis, and a limited number are seen during each clinic.   [facility]  [address] Hillard Danker [place], [place] [phone]   Eligibility Requirements You must have lived in [place], [place], or [place] counties for at least the last three months.   You cannot be eligible for state or federal sponsored [employer], including [employer], [place], or [employer].   You generally cannot be eligible for healthcare insurance through your employer.    How to apply: Eligibility screenings are held every Tuesday and Wednesday afternoon from 1:00 pm until 4:00 pm. You do not need an appointment for the interview!  [facility] [address] [place], [place]   [place]  [place] Department  [place]  [phone]    Behavioral Health Resources in the Community: Intensive Outpatient Programs Organization         Address  Phone  Notes  [facility] [place]. [address] [place], [place] [phone]   [facility]patient [address], [place], [place]   ADS: Alcohol & Drug Svcs [address], [place], [place]   [place] [address]  [place], [place] or [phone]   Substance Abuse Resources Organization         Address  Phone  Notes  Alcohol and Drug Services  [phone]   [place]  [phone]   The [place]   Chinita Pester  [phone]   Residential & Outpatient Substance Abuse Program  [phone]   Psychological Services Organization         Address  Phone  Notes  [facility] [place]  [place]  [phone]   [place] [address], [place] or [phone]    Mobile Crisis Teams Organization         Address  Phone  Notes  [facility], Mobile Crisis Care Unit  [phone]   [facility]  [address] [place], [place]   [place] [address] [place] [phone]    Self-Help/Support Groups Organization         Address  Phone             Notes  [place] [facility] [place] - variety of support groups  [place] Call for more information  Narcotics Anonymous (NA), [facility] [address], [employer] [place]  2 meetings at this location   Special educational needs teacher         Address  Phone  Notes  [facility]AP Residential Treatment [place],    [place]  [phone]   [facility]  [address] [place] [phone], [place], [place]   [place], [address] [phone] Admissions: 8am-3pm M-F  Incentives Substance [place] [address]    [place], [place] [phone]   [facility] [address] Jadene Pierini [place], [place]   [facility] [address]  [place], [place]   [facility] - Intensive Outpatient [address] Kristeen [address], [place], [place]   [facility] ([place] [address]  [place], [place] or [phone]   Residential Treatment Services (RTS) [place], [place]  [place] Medicaid  Fellowship [address],  [place]  [phone] Substance Abuse/Addiction Treatment   [facility] Organization         Address  Phone  Notes  [facility]  [phone]   Domenic Schwab, PhD [address] Arlis Porta [place], [place]   [phone] or [phone]   [place], [place] [phone]   [address], [place], [place] [phone] Insurance/Medicaid/sponsorship through [facility] and Families [address] [place]                                    [place], [place] [phone] [place] [address], [place] [phone]    Dr. Adele Schilder  [phone]   [facility] Dept. 1) [address], [place] 2) [place] 3)  [address], [place] [phone]  [phone]   [place] [phone] or [phone] (After Hours)

## 2015-04-18 NOTE — ED Notes (Signed)
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  Wants to stop.  Last use today.  Pt eating burger on assessment.

## 2015-04-19 ENCOUNTER — Encounter (HOSPITAL_COMMUNITY): Payer: Self-pay

## 2015-04-19 ENCOUNTER — Inpatient Hospital Stay (HOSPITAL_COMMUNITY)
Admission: AD | Admit: 2015-04-19 | Discharge: 2015-04-26 | DRG: 885 | Disposition: A | Discharge status: Home / Self Care | Payer: Federal, State, Local not specified - Other | Attending: Psychiatry | Admitting: Psychiatry

## 2015-04-19 ENCOUNTER — Inpatient Hospital Stay (HOSPITAL_COMMUNITY)
Admission: AD | Admit: 2015-04-19 | Payer: Federal, State, Local not specified - Other | Source: Intra-hospital | Admitting: Psychiatry

## 2015-04-19 DIAGNOSIS — F319 Bipolar disorder, unspecified: Secondary | ICD-10-CM | POA: Diagnosis present

## 2015-04-19 DIAGNOSIS — F17200 Nicotine dependence, unspecified, uncomplicated: Secondary | ICD-10-CM | POA: Diagnosis present

## 2015-04-19 DIAGNOSIS — R45851 Suicidal ideations: Secondary | ICD-10-CM | POA: Diagnosis present

## 2015-04-19 DIAGNOSIS — F3163 Bipolar disorder, current episode mixed, severe, without psychotic features: Principal | ICD-10-CM | POA: Diagnosis present

## 2015-04-19 DIAGNOSIS — F1123 Opioid dependence with withdrawal: Secondary | ICD-10-CM | POA: Diagnosis present

## 2015-04-19 DIAGNOSIS — F1193 Opioid use, unspecified with withdrawal: Secondary | ICD-10-CM | POA: Diagnosis present

## 2015-04-19 DIAGNOSIS — F1120 Opioid dependence, uncomplicated: Secondary | ICD-10-CM | POA: Diagnosis present

## 2015-04-19 DIAGNOSIS — F4310 Post-traumatic stress disorder, unspecified: Secondary | ICD-10-CM | POA: Diagnosis present

## 2015-04-19 DIAGNOSIS — R45850 Homicidal ideations: Secondary | ICD-10-CM | POA: Diagnosis present

## 2015-04-19 DIAGNOSIS — F1220 Cannabis dependence, uncomplicated: Secondary | ICD-10-CM | POA: Diagnosis not present

## 2015-04-19 DIAGNOSIS — F329 Major depressive disorder, single episode, unspecified: Secondary | ICD-10-CM | POA: Diagnosis present

## 2015-04-19 DIAGNOSIS — F1420 Cocaine dependence, uncomplicated: Secondary | ICD-10-CM | POA: Diagnosis present

## 2015-04-19 DIAGNOSIS — F17210 Nicotine dependence, cigarettes, uncomplicated: Secondary | ICD-10-CM | POA: Diagnosis present

## 2015-04-19 HISTORY — DX: Anxiety disorder, unspecified: F41.9

## 2015-04-19 MED ORDER — PENICILLIN V POTASSIUM 250 MG PO TABS
500.0000 mg | ORAL_TABLET | Freq: Four times a day (QID) | ORAL | Status: DC
  Administered 2015-04-19 (×2): 500 mg via ORAL
  Filled 2015-04-19 (×2): qty 2

## 2015-04-19 MED ORDER — ACETAMINOPHEN 325 MG PO TABS
650.0000 mg | ORAL_TABLET | Freq: Four times a day (QID) | ORAL | Status: DC | PRN
  Administered 2015-04-19 – 2015-04-26 (×3): 650 mg via ORAL
  Filled 2015-04-19 (×3): qty 2

## 2015-04-19 MED ORDER — METHOCARBAMOL 500 MG PO TABS: 500.0000 mg | ORAL_TABLET | Freq: Three times a day (TID) | ORAL | Status: DC | PRN

## 2015-04-19 MED ORDER — CLONIDINE HCL 0.1 MG PO TABS
0.1000 mg | ORAL_TABLET | Freq: Four times a day (QID) | ORAL | Status: DC
  Administered 2015-04-19: 0.1 mg via ORAL
  Filled 2015-04-19: qty 1

## 2015-04-19 MED ORDER — CLONIDINE HCL 0.1 MG PO TABS
0.1000 mg | ORAL_TABLET | ORAL | Status: DC
Start: 2015-04-21 — End: 2015-04-19

## 2015-04-19 MED ORDER — CLONIDINE HCL 0.1 MG PO TABS
0.1000 mg | ORAL_TABLET | Freq: Four times a day (QID) | ORAL | Status: AC
  Administered 2015-04-19 – 2015-04-21 (×8): 0.1 mg via ORAL
  Filled 2015-04-19 (×9): qty 1

## 2015-04-19 MED ORDER — ONDANSETRON 4 MG PO TBDP
4.0000 mg | ORAL_TABLET | Freq: Four times a day (QID) | ORAL | Status: AC | PRN
  Administered 2015-04-19: 4 mg via ORAL
  Filled 2015-04-19: qty 1

## 2015-04-19 MED ORDER — METHOCARBAMOL 500 MG PO TABS
500.0000 mg | ORAL_TABLET | Freq: Three times a day (TID) | ORAL | Status: AC | PRN
  Administered 2015-04-19 – 2015-04-24 (×7): 500 mg via ORAL
  Filled 2015-04-19 (×7): qty 1

## 2015-04-19 MED ORDER — CLONIDINE HCL 0.1 MG PO TABS
0.1000 mg | ORAL_TABLET | ORAL | Status: AC
  Administered 2015-04-21 – 2015-04-23 (×4): 0.1 mg via ORAL
  Filled 2015-04-19 (×5): qty 1

## 2015-04-19 MED ORDER — NICOTINE 21 MG/24HR TD PT24
21.0000 mg | MEDICATED_PATCH | Freq: Every day | TRANSDERMAL | Status: DC
  Administered 2015-04-19 – 2015-04-26 (×8): 21 mg via TRANSDERMAL
  Filled 2015-04-19 (×14): qty 1

## 2015-04-19 MED ORDER — PENICILLIN V POTASSIUM 500 MG PO TABS: 500.0000 mg | ORAL_TABLET | Freq: Four times a day (QID) | ORAL | Status: DC

## 2015-04-19 MED ORDER — NAPROXEN 500 MG PO TABS
500.0000 mg | ORAL_TABLET | Freq: Two times a day (BID) | ORAL | Status: AC | PRN
  Administered 2015-04-19 – 2015-04-23 (×5): 500 mg via ORAL
  Filled 2015-04-19 (×5): qty 1

## 2015-04-19 MED ORDER — CLONIDINE HCL 0.1 MG PO TABS: 0.1000 mg | ORAL_TABLET | Freq: Every day | ORAL | Status: DC

## 2015-04-19 MED ORDER — MAGNESIUM HYDROXIDE 400 MG/5ML PO SUSP: 30.0000 mL | Freq: Every day | ORAL | Status: DC | PRN

## 2015-04-19 MED ORDER — ONDANSETRON 4 MG PO TBDP: 4.0000 mg | ORAL_TABLET | Freq: Four times a day (QID) | ORAL | Status: DC | PRN

## 2015-04-19 MED ORDER — ALUM & MAG HYDROXIDE-SIMETH 200-200-20 MG/5ML PO SUSP: 30.0000 mL | ORAL | Status: DC | PRN

## 2015-04-19 MED ORDER — LOPERAMIDE HCL 2 MG PO CAPS: 2.0000 mg | ORAL_CAPSULE | ORAL | Status: DC | PRN

## 2015-04-19 MED ORDER — HYDROXYZINE HCL 50 MG PO TABS
50.0000 mg | ORAL_TABLET | Freq: Once | ORAL | Status: AC
  Administered 2015-04-19: 50 mg via ORAL
  Filled 2015-04-19: qty 1

## 2015-04-19 MED ORDER — PENICILLIN V POTASSIUM 500 MG PO TABS
500.0000 mg | ORAL_TABLET | Freq: Four times a day (QID) | ORAL | Status: AC
Start: 2015-04-19 — End: 2015-04-21
  Administered 2015-04-19 – 2015-04-21 (×8): 500 mg via ORAL
  Filled 2015-04-19: qty 2
  Filled 2015-04-19 (×3): qty 1
  Filled 2015-04-19: qty 2
  Filled 2015-04-19 (×5): qty 1
  Filled 2015-04-19: qty 2

## 2015-04-19 MED ORDER — DICYCLOMINE HCL 20 MG PO TABS: 20.0000 mg | ORAL_TABLET | Freq: Four times a day (QID) | ORAL | Status: DC | PRN

## 2015-04-19 MED ORDER — NAPROXEN 500 MG PO TABS
500.0000 mg | ORAL_TABLET | Freq: Two times a day (BID) | ORAL | Status: DC | PRN
Start: 2015-04-19 — End: 2015-04-19

## 2015-04-19 MED ORDER — IBUPROFEN 600 MG PO TABS
600.0000 mg | ORAL_TABLET | Freq: Four times a day (QID) | ORAL | Status: DC | PRN
  Administered 2015-04-19: 600 mg via ORAL
  Filled 2015-04-19: qty 1

## 2015-04-19 MED ORDER — HYDROXYZINE HCL 25 MG PO TABS
25.0000 mg | ORAL_TABLET | Freq: Four times a day (QID) | ORAL | Status: DC | PRN
  Administered 2015-04-19 – 2015-04-21 (×6): 25 mg via ORAL
  Filled 2015-04-19 (×6): qty 1

## 2015-04-19 MED ORDER — DICYCLOMINE HCL 20 MG PO TABS
20.0000 mg | ORAL_TABLET | Freq: Four times a day (QID) | ORAL | Status: AC | PRN
  Administered 2015-04-19 – 2015-04-20 (×3): 20 mg via ORAL
  Filled 2015-04-19 (×3): qty 1

## 2015-04-19 MED ORDER — CLONIDINE HCL 0.1 MG PO TABS
0.1000 mg | ORAL_TABLET | Freq: Every day | ORAL | Status: AC
  Administered 2015-04-24 – 2015-04-25 (×2): 0.1 mg via ORAL
  Filled 2015-04-19 (×3): qty 1

## 2015-04-19 MED ORDER — QUETIAPINE FUMARATE 100 MG PO TABS
100.0000 mg | ORAL_TABLET | Freq: Every day | ORAL | Status: DC
  Administered 2015-04-19: 100 mg via ORAL
  Filled 2015-04-19 (×4): qty 1

## 2015-04-19 MED ORDER — ACETAMINOPHEN 325 MG PO TABS
650.0000 mg | ORAL_TABLET | Freq: Four times a day (QID) | ORAL | Status: DC | PRN
Start: 2015-04-19 — End: 2015-04-19

## 2015-04-19 MED ORDER — LOPERAMIDE HCL 2 MG PO CAPS
2.0000 mg | ORAL_CAPSULE | ORAL | Status: AC | PRN
  Administered 2015-04-19: 4 mg via ORAL
  Administered 2015-04-20 (×2): 2 mg via ORAL
  Administered 2015-04-21: 4 mg via ORAL
  Filled 2015-04-19: qty 1
  Filled 2015-04-19: qty 2
  Filled 2015-04-19: qty 1
  Filled 2015-04-19: qty 2

## 2015-04-19 MED ORDER — PENICILLIN V POTASSIUM 500 MG PO TABS
500.0000 mg | ORAL_TABLET | Freq: Four times a day (QID) | ORAL | Status: DC
  Administered 2015-04-19: 500 mg via ORAL
  Filled 2015-04-19: qty 1

## 2015-04-19 MED ORDER — HYDROXYZINE HCL 25 MG PO TABS
25.0000 mg | ORAL_TABLET | Freq: Four times a day (QID) | ORAL | Status: DC | PRN
Start: 2015-04-19 — End: 2015-04-19

## 2015-04-19 MED ORDER — QUETIAPINE FUMARATE 100 MG PO TABS
100.0000 mg | ORAL_TABLET | Freq: Once | ORAL | Status: AC
Start: 2015-04-19 — End: 2015-04-19
  Administered 2015-04-19: 100 mg via ORAL
  Filled 2015-04-19: qty 1

## 2015-04-19 NOTE — ED Notes (Signed)
[facility] transport at bedside. Pt alert, oriented, and ambulatory upon DC. He leaves with all belongings in hand. He is appreciative of the care we provided.

## 2015-04-19 NOTE — ED Notes (Signed)
Attempting to call [facility] transport.

## 2015-04-19 NOTE — Progress Notes (Signed)
Patient did not attend AA group meeting tonight.  

## 2015-04-19 NOTE — Progress Notes (Signed)
Nurse Shift Assessment  Patient is sleeping but easily awakens; is appropriate and cooperative; denies any complaints or s/s of any withdrawal issues.  Patient is compliant with medications and short Nursing assessment of his current thoughts and feelings. Patient currently denies any SI/HI/AVH but does admit to continuing depression. Q15 minute checks for safety continuous; therapeutic communication and support provided by staff. Patient remains safe on Unit and verbally contracts for safety.

## 2015-04-19 NOTE — BH Assessment (Signed)
[facility] Assessment Progress Note Patient was admitted to adult unit this date for further observation and evaluation.

## 2015-04-19 NOTE — Progress Notes (Signed)
Patient ID: Fred MossesJohn Swanton Jr., male   DOB: [birth year], 29 y.o.   MRN: [id number] [facility].  Pt states he is having "homicidal thoughts, and I wanted to get some help because I was doing a bunch of drugs and shit like that."  Pt movements are slow and speech is slow.  Pt stated HI was not directed to anyone in particular.  Currently denying thoughts to hurt self.  Pt states that he has been sleeping all day.  Pt escorted onto unit and oriented to unit.  Treatment process explained to pt.

## 2015-04-19 NOTE — Plan of Care (Signed)
[facility] Observation Crisis Plan  Reason for Crisis Plan:  Crisis Stabilization and Substance Abuse   Plan of Care:  Referral for Inpatient Hospitalization  Family Support:   Mother: Garrel RidgelDebra Deschler   Current Living Environment:  Living Arrangements: Parent (mom & dad)  Insurance:   Hospital Account    Name Acct ID Class Status Primary Coverage   Lafonda MossesMarshall, Marcus Jr. [id number] BEHAVIORAL HEALTH OBSERVATION Discharged/Not Billed None        Guarantor Account (for Hospital Account [id number])    Name Relation to Pt Service Area Active? Acct Type   Lafonda MossesMarshall, Izmael Jr. Self [facility] Yes [facility]   Address Phone       [address] [place] [address] [place], [place] [id number] [phone])          Coverage Information (for Hospital Account [id number])    Not on file      Legal Guardian:     Primary Care Provider:  No PCP Per Patient  Current Outpatient Providers:  None  Psychiatrist:  Name of Psychiatrist: none  Counselor/Therapist:  Name of Therapist: none  Compliant with Medications:  No  Additional Information:   Ephriam Knucklesracy Ann Aarib Pulido 11/11/20163:08 AM

## 2015-04-19 NOTE — Discharge Instructions (Signed)
Dental Caries °Dental caries (also called tooth decay) is the most common oral disease. It can occur at any age but is more common in children and young adults.  °HOW DENTAL CARIES DEVELOPS  °The process of decay begins when bacteria and foods (particularly sugars and starches) combine in your mouth to produce plaque. Plaque is a substance that sticks to the hard, outer surface of a tooth (enamel). The bacteria in plaque produce acids that attack enamel. These acids may also attack the root surface of a tooth (cementum) if it is exposed. Repeated attacks dissolve these surfaces and create holes in the tooth (cavities). If left untreated, the acids destroy the other layers of the tooth.  °RISK FACTORS °· Frequent sipping of sugary beverages.   °· Frequent snacking on sugary and starchy foods, especially those that easily get stuck in the teeth.   °· Poor oral hygiene.   °· Dry mouth.   °· Substance abuse such as methamphetamine abuse.   °· Broken or poor-fitting dental restorations.   °· Eating disorders.   °· Gastroesophageal reflux disease (GERD).   °· Certain radiation treatments to the head and neck. °SYMPTOMS °In the early stages of dental caries, symptoms are seldom present. Sometimes white, chalky areas may be seen on the enamel or other tooth layers. In later stages, symptoms may include: °· Pits and holes on the enamel. °· Toothache after sweet, hot, or cold foods or drinks are consumed. °· Pain around the tooth. °· Swelling around the tooth. °DIAGNOSIS  °Most of the time, dental caries is detected during a regular dental checkup. A diagnosis is made after a thorough medical and dental history is taken and the surfaces of your teeth are checked for signs of dental caries. Sometimes special instruments, such as lasers, are used to check for dental caries. Dental X-ray exams may be taken so that areas not visible to the eye (such as between the contact areas of the teeth) can be checked for cavities.    °TREATMENT  °If dental caries is in its early stages, it may be reversed with a fluoride treatment or an application of a remineralizing agent at the dental office. Thorough brushing and flossing at home is needed to aid these treatments. If it is in its later stages, treatment depends on the location and extent of tooth destruction:  °· If a small area of the tooth has been destroyed, the destroyed area will be removed and cavities will be filled with a material such as gold, silver amalgam, or composite resin.   °· If a large area of the tooth has been destroyed, the destroyed area will be removed and a cap (crown) will be fitted over the remaining tooth structure.   °· If the center part of the tooth (pulp) is affected, a procedure called a root canal will be needed before a filling or crown can be placed.   °· If most of the tooth has been destroyed, the tooth may need to be pulled (extracted). °HOME CARE INSTRUCTIONS °You can prevent, stop, or reverse dental caries at home by practicing good oral hygiene. Good oral hygiene includes: °· Thoroughly cleaning your teeth at least twice a day with a toothbrush and dental floss.   °· Using a fluoride toothpaste. A fluoride mouth rinse may also be used if recommended by your dentist or health care provider.   °· Restricting the amount of sugary and starchy foods and sugary liquids you consume.   °· Avoiding frequent snacking on these foods and sipping of these liquids.   °· Keeping regular visits with   a dentist for checkups and cleanings. °PREVENTION  °· Practice good oral hygiene. °· Consider a dental sealant. A dental sealant is a coating material that is applied by your dentist to the pits and grooves of teeth. The sealant prevents food from being trapped in them. It may protect the teeth for several years. °· Ask about fluoride supplements if you live in a community without fluorinated water or with water that has a low fluoride content. Use fluoride supplements  as directed by your dentist or health care provider. °· Allow fluoride varnish applications to teeth if directed by your dentist or health care provider. °  °This information is not intended to replace advice given to you by your health care provider. Make sure you discuss any questions you have with your health care provider. °  °Document Released: 02/14/2002 Document Revised: 06/15/2014 Document Reviewed: 05/27/2012 °Elsevier Interactive Patient Education ©2016 [employer]vier Inc[employer] ° °Emergency Department Resource Guide °1) Find a Doctor and Pay Out of Pocket °Although you won't have to find out who is covered by your insurance plan, it is a good idea to ask around and get recommendations. You will then need to call the office and see if the doctor you have chosen will accept you as a new patient and what types of options they offer for patients who are self-pay. Some doctors offer discounts or will set up payment plans for their patients who do not have insurance, but you will need to ask so you aren't surprised when you get to your appointment. ° °2) Contact Your Local Health Department °Not all health departments have doctors that can see patients for sick visits, but many do, so it is worth a call to see if yours does. If you don't know where your local health department is, you can check in your phone book. The CDC also has a tool to help you locate your state's health department, and many state websites also have listings of all of their local health departments. ° °3) Find a Walk-in Clinic °If your illness is not likely to be very severe or complicated, you may want to try a walk in clinic. These are popping up all over the country in pharmacies, drugstores, and shopping centers. They're usually staffed by nurse practitioners or physician assistants that have been trained to treat common illnesses and complaints. They're usually fairly quick and inexpensive. However, if you have serious medical issues or chronic  medical problems, these are probably not your best option. ° °No Primary Care Doctor: °- Call [facility] at  832-8000 - they can help you locate a primary care doctor that  accepts your insurance, provides certain services, etc. °- Physician Referral Service- 1-800-533-3463 ° °Chronic Pain Problems: °Organization         Address  Phone   Notes  °[facility]  (336) 297-2271 Patients need to be referred by their primary care doctor.  ° °Medication Assistance: °Organization         Address  Phone   Notes  °[place] [facility] [address] °[place], [place] [address] (336) 641-8030 --Must be a resident of [place] °-- Must have NO insurance coverage whatsoever (no Medicaid/ Medicare, etc.) °-- The pt. MUST have a primary care doctor that directs their care regularly and follows them in the community °  °[facility]  (866) 331-1348   °[employer]  (888) 892-1162   ° °Agencies that provide inexpensive medical care: °Organization           Address  Phone   Notes  °[facility]  (336) 832-8035   °[facility]    (336) 832-7272   °[facility] [address] °[place], [place] [address] (336) 832-4777   °[facility] [address], °[place] (336) 271-4999   °[facility]    (336) 373-0678   °[facility]    (336) 272-1050   °[facility] ° [address], [place] Phone:  (336) 832-4444, Fax:  (336) 832-4440 Hours of Operation:  9 am - 6 pm, M-F.  Also accepts Medicaid/Medicare and self-pay.  °[facility] ° [address], [place] Phone: (336) 832-3150, Fax: (336) 832-3151. Hours of Operation:  8:30 am - 5:30 pm, M-F.  Also accepts Medicaid and self-pay.  °[facility] [place] [address], [place] Phone: (336) 878-6027   °[facility] [address], [place], [place] (336)723-1848,  Ext. 123 Mondays & Thursdays: 7-9 AM.  First 15 patients are seen on a first come, first serve basis. °  ° °Medicaid-accepting [place] Providers: ° °Organization         Address  Phone   Notes  °[facility] [address], [place] (336) 641-2100 Also accepts self-pay patients.  °[facility] [address], [place] ° (336) 856-9996   °[facility] [address], [place] (336) 288-8857   °[facility] [address] [place] [address], [place] (336) 299-7000   °Veita Bland [address], [place]  ° (336) 373-1557 Only accepts [place] Access Medicaid patients after they have their name applied to their card.  ° °Self-Pay (no insurance) in [place]: ° °Organization         Address  Phone   Notes  °Sickle Cell Patients, [facility] [address], [place] (336) 832-1970   °[facility] [address],  (336) 832-4400   °[facility] ° [address], [place] (336) 992-4800   °[facility]/Dr. Osei-Bonsu ° [address] [place] [address], [place] or [address], [place] (336) 841-8500 Phone number for both [place] and [place] locations is the same.  °[facility] [address], [place] (336) 299-0000   °[facility] [address] [place] [address], [place] or [address] (336) 852-7530 °(336) 878-2260   °[facility] [address], [place] (336) 350-1642, phone; (336) 294-5005, fax Sees patients 1st and 3rd Saturday of every month.  Must not qualify for public or private insurance (i.e. Medicaid, Medicare, [place] Health Choice, Veterans' Benefits) • Household income should be no more than 200% of the poverty level •The clinic cannot treat you if you are pregnant or think you are pregnant • Sexually transmitted diseases are not  treated at the clinic.  ° ° °Dental Care: °Organization         Address  Phone  Notes  °[facility] [address], [place] (336) 641-6152 Accepts children up to age 21 who are enrolled in Medicaid or [place] Health Choice; pregnant women with a Medicaid card; and children who have applied for Medicaid or [place] Health Choice, but were declined, whose parents can pay a reduced fee at time   of service.  °[facility] [place]  [address], [place] (336) 641-7733 Accepts children up to age 21 who are enrolled in Medicaid or [place] Health Choice; pregnant women with a Medicaid card; and children who have applied for Medicaid or [place] Health Choice, but were declined, whose parents can pay a reduced fee at time of service.  °[facility] ° [address], [place] (336) 641-4533 Patients are seen by appointment only. Walk-ins are not accepted. [facility] will see patients 18 years of age and older. °Monday - Tuesday (8am-5pm) °Most Wednesdays (8:30-5pm) °$30 per visit, cash only  °[facility] ° [address], [place] (336) 641-4533 Patients are seen by appointment only. Walk-ins are not accepted. [facility] will see patients 18 years of age and older. °One Wednesday Evening (Monthly: Volunteer Based).  $30 per visit, cash only  °[facility]  (919) 537-3737 for adults; Children under age 4, call Graduate Pediatric Dentistry at ([phone]) [phone]. Children aged 4-14, please call ([phone]) [phone] to request a pediatric application. ° Dental services are provided in all areas of dental care including fillings, crowns and bridges, complete and partial dentures, implants, gum treatment, root canals, and extractions. Preventive care is also provided. Treatment is provided to both adults and children. °Patients are selected via a lottery and there is  often a waiting list. °  °[facility] [address], °[place] ° (336) 763-8833 [email or web address] °  °[facility] [address], [place], [place] (336)723-1848, Ext. 123 Second and Fourth Thursday of each month, opens at 6:30 AM; Clinic ends at 9 AM.  Patients are seen on a first-come first-served basis, and a limited number are seen during each clinic.  ° °[facility] ° [address], [place], [place] (336) 723-7904   Eligibility Requirements °You must have lived in [place], [place], or [place] counties for at least the last three months. °  You cannot be eligible for state or federal sponsored healthcare insurance, including Veterans Administration, Medicaid, or Medicare. °  You generally cannot be eligible for healthcare insurance through your employer.  °  How to apply: °Eligibility screenings are held every Tuesday and Wednesday afternoon from 1:00 pm until 4:00 pm. You do not need an appointment for the interview!  °[facility] [address], [place], [place] 336-631-2330   °[facility]  336-342-8273   °[facility]  336-703-3100   °[facility]  336-570-6415   ° °Behavioral Health Resources in the Community: °Intensive Outpatient Programs °Organization         Address  Phone  Notes  °[facility] [address], [place], [place] 336-878-6098   °[facility] [address], [place], [place] 336-832-9800   °[facility] [address], , [place] ° 336-882-2125   °[facility] [address],  °,  1-800-853-5163 or 336-641-4981   °Substance Abuse Resources °Organization         Address  Phone  Notes  °[facility]  336-882-2125   °[facility]  336-784-9470   °[facility]  336-285-9073   °[facility]  336-845-3988   °Residential & Outpatient Substance  Abuse Program  1-800-659-3381   °Psychological Services °Organization         Address    Phone  Notes  °[facility]  336- 832-9600   °[facility]  336- 378-7881   °[facility] [address], [place] 1-800-853-5163 or 336-641-4981   ° °Mobile Crisis Teams °Organization         Address  Phone  Notes  °[facility], Mobile Crisis Care Unit  1-877-626-1772   °Assertive °[facility] ° [address] [place], [place] 336-834-9664   °Sharon DeEsch [address] °[place] 336-554-5454   ° °Self-Help/Support Groups °Organization         Address  Phone             Notes  °[facility] - variety of support groups  336- 373-1402 Call for more information  °Narcotics Anonymous (NA), [facility] [address], °[place]  2 meetings at this location  ° °Residential Treatment Programs °Organization         Address  Phone  Notes  °[facility] [address],    °[place]  1-866-801-8205   °[facility] ° [address], [place], [place] 704-293-8524   °[facility] [address], [place] 336-845-3988 Admissions: 8am-3pm M-F  °[facility] [address]    °[place], [place] 336-841-1104   °[facility] [address], [place], [place] 336-379-7146   °[facility] [address]  °[place], [place] 336-285-9073   °[facility] - Intensive Outpatient [address], [place], [place] 336-852-3033   °[facility] [address]  °[place], [place] 1-877-615-2722 or 336-784-9470   °[facility] [address] [place], [place] 336-227-7417 Accepts Medicaid  °[facility] [address]  ° [place] 1-800-659-3381 Substance Abuse/Addiction Treatment  ° °[place] Behavioral Health Resources °Organization          Address  Phone  Notes  °[facility]  (888) 581-9988   °Julie Brannon, PhD [address] [place], [place]   (336) 349-5553 or (336) 951-0000   °[facility]   [address] °[place], [place] (336) 349-4454   °[facility] [address], [place], [place] (336) 342-8316 Insurance/Medicaid/sponsorship through [facility]  °[facility] [address]                                    [place], [place] (336) 342-8316 Therapy/tele-psych/case  °[facility] [address]  ° [place], [place] (336) 349-2233    °Dr. Arfeen  (336) 349-4544   °[facility] [place]  [employer] [place] [facility] 1) [address], [place] °2) [address], [place] °3)  [address], [place] (336) 349-3220 °(336) 342-7768 ° °(336) 342-8140   °[place] [facility] (336) 342-1394 or (336) 342-3537 (After Hours)    ° ° °
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
## 2015-04-19 NOTE — Progress Notes (Signed)
Nursing Progress Note:  NP Agustin in to see patient, who is very despondent and slow to respond to any questions asked by RN, TTS, and NP. Patient is not interested in his lunch and will not answer why. NP to write orders for patient including for a transfer to Adult Inpatient Unit.
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
## 2015-04-19 NOTE — Progress Notes (Signed)
[facility] INPATIENT:  Family/Significant Other Suicide Prevention Education  Suicide Prevention Education:  Patient Discharged to Other Healthcare Facility:  Suicide Prevention Education Not Provided: {PT. DISCHARGED TO OTHER HEALTHCARE FACILITY:SUICIDE PREVENTION EDUCATION NOT PROVIDED ([facility]):  The patient is discharging to another healthcare facility for continuation of treatment.  The patient's medical information, including suicide ideations and risk factors, are a part of the medical information shared with the receiving healthcare facility.  Fred Reynolds 04/19/2015, 2:38 AM

## 2015-04-19 NOTE — ED Notes (Signed)
[facility].

## 2015-04-19 NOTE — Tx Team (Signed)
Initial Interdisciplinary Treatment Plan   PATIENT STRESSORS: Legal issue Marital or family conflict Medication change or noncompliance Substance abuse   PATIENT STRENGTHS: Average or above average intelligence General fund of knowledge Motivation for treatment/growth Physical Health   PROBLEM LIST: Problem List/Patient Goals Date to be addressed Date deferred Reason deferred Estimated date of resolution  Substance Abuse Detox - Pt states "I was doing a bunch of drugs" 04-19-2015     Cessation of homicidal ideation - pt states having "homicidal thoughts" 04-19-2015     Medication noncompliance 04-19-2015                                          DISCHARGE CRITERIA:  Ability to meet basic life and health needs Adequate post-discharge living arrangements Improved stabilization in mood, thinking, and/or behavior Medical problems require only outpatient monitoring Motivation to continue treatment in a less acute level of care Need for constant or close observation no longer present Verbal commitment to aftercare and medication compliance Withdrawal symptoms are absent or subacute and managed without 24-hour nursing intervention  PRELIMINARY DISCHARGE PLAN: Attend 12-step recovery group Outpatient therapy Participate in family therapy  PATIENT/FAMIILY INVOLVEMENT: This treatment plan has been presented to and reviewed with the patient, Fred MossesJohn Kelleher Jr..  The patient and family have been given the opportunity to ask questions and make suggestions.  [name], Fred Reynolds 04/19/2015, 3:10 PM
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
## 2015-04-20 ENCOUNTER — Encounter (HOSPITAL_COMMUNITY): Payer: Self-pay | Admitting: Psychiatry

## 2015-04-20 DIAGNOSIS — F3163 Bipolar disorder, current episode mixed, severe, without psychotic features: Principal | ICD-10-CM

## 2015-04-20 DIAGNOSIS — F1120 Opioid dependence, uncomplicated: Secondary | ICD-10-CM | POA: Diagnosis present

## 2015-04-20 DIAGNOSIS — F17200 Nicotine dependence, unspecified, uncomplicated: Secondary | ICD-10-CM

## 2015-04-20 DIAGNOSIS — F1420 Cocaine dependence, uncomplicated: Secondary | ICD-10-CM

## 2015-04-20 DIAGNOSIS — F1123 Opioid dependence with withdrawal: Secondary | ICD-10-CM | POA: Diagnosis present

## 2015-04-20 DIAGNOSIS — F1193 Opioid use, unspecified with withdrawal: Secondary | ICD-10-CM | POA: Diagnosis present

## 2015-04-20 DIAGNOSIS — F1220 Cannabis dependence, uncomplicated: Secondary | ICD-10-CM | POA: Diagnosis present

## 2015-04-20 DIAGNOSIS — F4310 Post-traumatic stress disorder, unspecified: Secondary | ICD-10-CM

## 2015-04-20 MED ORDER — CARBAMAZEPINE ER 100 MG PO TB12
100.0000 mg | ORAL_TABLET | Freq: Two times a day (BID) | ORAL | Status: DC
  Administered 2015-04-20 – 2015-04-22 (×4): 100 mg via ORAL
  Filled 2015-04-20 (×6): qty 1

## 2015-04-20 MED ORDER — CITALOPRAM HYDROBROMIDE 10 MG PO TABS
10.0000 mg | ORAL_TABLET | Freq: Every day | ORAL | Status: DC
  Administered 2015-04-20 – 2015-04-24 (×5): 10 mg via ORAL
  Filled 2015-04-20 (×8): qty 1

## 2015-04-20 MED ORDER — QUETIAPINE FUMARATE 25 MG PO TABS
ORAL_TABLET | ORAL | Status: AC
  Administered 2015-04-20: 15:00:00
  Filled 2015-04-20: qty 1

## 2015-04-20 MED ORDER — QUETIAPINE FUMARATE 200 MG PO TABS
200.0000 mg | ORAL_TABLET | Freq: Every day | ORAL | Status: DC
  Administered 2015-04-20 – 2015-04-25 (×6): 200 mg via ORAL
  Filled 2015-04-20: qty 7
  Filled 2015-04-20 (×8): qty 1

## 2015-04-20 MED ORDER — QUETIAPINE FUMARATE 50 MG PO TABS
50.0000 mg | ORAL_TABLET | Freq: Three times a day (TID) | ORAL | Status: DC | PRN
  Administered 2015-04-20 – 2015-04-22 (×4): 50 mg via ORAL
  Filled 2015-04-20 (×4): qty 1

## 2015-04-20 NOTE — [facility] Suicide Risk Assessment (Signed)
[facility]   Nursing information obtained from:    Demographic factors:    Current Mental Status:    Loss Factors:    Historical Factors:    Risk Reduction Factors:    Total Time spent with patient: 30 minutes Principal Problem: Bipolar disorder, curr episode mixed, severe, w/o psychotic features (HCC) Diagnosis:   Patient Active Problem List   Diagnosis Date Noted  . Bipolar disorder, curr episode mixed, severe, w/o psychotic features (HCC) [F31.63] 04/20/2015  . PTSD (post-traumatic stress disorder) [F43.10] 04/20/2015  . Opioid use disorder, severe, dependence (HCC) [F11.20] 04/20/2015  . Opioid withdrawal (HCC) [F11.23] 04/20/2015  . Cannabis use disorder, severe, dependence (HCC) [F12.20] 04/20/2015  . Cocaine use disorder, moderate, dependence (HCC) [F14.20] 04/20/2015  . Tobacco use disorder [F17.200] 04/20/2015     Continued Clinical Symptoms:  Alcohol Use Disorder Identification Test Final Score (AUDIT): 2 The "Alcohol Use Disorders Identification Test", Guidelines for Use in Primary Care, Second Edition.  World Science writerHealth Organization [facility](WHO). Score between 0-7:  no or low risk or alcohol related problems. Score between 8-15:  moderate risk of alcohol related problems. Score between 16-19:  high risk of alcohol related problems. Score 20 or above:  warrants further diagnostic evaluation for alcohol dependence and treatment.   CLINICAL FACTORS:   Alcohol/Substance Abuse/Dependencies Unstable or Poor Therapeutic Relationship Previous Psychiatric Diagnoses and Treatments   Musculoskeletal: Strength & Muscle Tone: within normal limits Gait & Station: normal Patient leans: N/A  Psychiatric Specialty Exam: Physical Exam  ROS  Blood pressure 134/76, pulse 53, temperature 98 F (36.7 C), temperature source Oral, resp. rate 18, height 5\' 3"  (1.6 m), weight 90.719 kg (200 lb), SpO2 98 %.Body mass index is 35.44 kg/(m^2).                       Please see H&P.                                    COGNITIVE FEATURES THAT CONTRIBUTE TO RISK:  Closed-mindedness, Polarized thinking and Thought constriction (tunnel vision)    SUICIDE RISK:   Moderate:  Frequent suicidal ideation with limited intensity, and duration, some specificity in terms of plans, no associated intent, good self-control, limited dysphoria/symptomatology, some risk factors present, and identifiable protective factors, including available and accessible social support.  PLAN OF CARE: Please see H&P.   Medical Decision Making:  Review of Psycho-Social Stressors (1), Review or order clinical lab tests (1), Review and summation of old records (2), Established Problem, Worsening (2), New Problem, with no additional work-up planned (3), Review of Last Therapy Session (1), Review of Medication Regimen & Side Effects (2) and Review of New Medication or Change in Dosage (2)  I certify that inpatient services furnished can reasonably be expected to improve the patient's condition.   Raja Liska MD 04/20/2015, 3:37 PM

## 2015-04-20 NOTE — Progress Notes (Signed)
Fred Reynolds is doing fair this morning.[name]. He is out in the milieu on the [address] ...interacting with his peers. He reports his stomach is " still bothering me" and says the bentyl, immodium and vistaril he received at 0550 " did not help a lot".  He is educated about heroin withdrawal and the purpose of the prn medications as being to decrease the LEVEL of discomfort, as opposed to totally eradicating the symptoms all of the way.  He completed his daily assessment and on it he wrote he deneid SI and he rated his depression, hopelessness and anxiety " 10/10/ 10" respectively.   A He is given prn robaxin and immodium for c/o body aches and diarrhea.   R Safety is in place.

## 2015-04-20 NOTE — Progress Notes (Signed)
[facility] Group Notes:  (Nursing/MHT/Case Management/Adjunct)  Date:  04/20/2015  Time:  11:57 PM  Type of Therapy:  Psychoeducational Skills  Participation Level:  Active  Participation Quality:  Attentive  Affect:  Depressed  Cognitive:  Appropriate  Insight:  Improving  Engagement in Group:  Improving  Modes of Intervention:  Education  Summary of Progress/Problems: The patient described his day as having been like a "roller coaster". The patient is complaining of having difficulty sleeping for the past four days and that he is struggling with his withdrawal from drugs. He states that he feels very nervous and anxious at times and that the Seroquel has not been effective. In terms of the theme for the day, his coping skill will be to go and ride his motorcycle once he is discharged.   Hazle CocaGOODMAN, Darriona Dehaas S 04/20/2015, 11:57 PM

## 2015-04-20 NOTE — Progress Notes (Signed)
D RN spoke with MD and prn order for Seroquel 50 mg tid  po prn obtained. Pt c/o'd severe anxiety  And requested prn and given seroquel. EKG obtained  maintaiend.

## 2015-04-20 NOTE — [facility] Group Notes (Signed)
[facility] Group Notes:  (Clinical Social Work)   04/06/2015     1:15-2:15PM  Summary of Progress/Problems:   In today's process group a decisional balance exercise was used to explore in depth the perceived benefits and costs of alcohol and drugs, as well as the  benefits and costs of replacing these with healthy coping skills.  Patients listed healthy and unhealthy coping techniques, determining with CSW guidance that unhealthy coping techniques work initially, but eventually become harmful.  Motivational Interviewing and the whiteboard were utilized for the exercises.  The patient expressed that the unhealthy coping he often uses is beating himself up and suicidal ideation.  He did not feel well and did not interact much.  Type of Therapy:  Group Therapy - Process   Participation Level:  Active  Participation Quality:  Attentive  Affect:  Depressed, Irritable and sick  Cognitive:  Oriented  Insight:  Developing/Improving  Engagement in Therapy:  Engaged  Modes of Intervention:  Education, Motivational Interviewing  Fred MantleMareida Grossman-Orr, LCSW 04/20/2015, 3:48 PM

## 2015-04-20 NOTE — Progress Notes (Signed)
D. Pt was up intermittently during the evening, minimal interaction or participation in milieu. Pt did complain of various withdrawal symptoms including nausea, diarrhea and anxiety. Pt spoke about how he had been using heroin daily for 9 months and spoke about how he may want to talk to the doctor about suboxone or methadone. Pt has received various medications this evening to help withdrawal symptoms. A. Support provided, medication education given. R. Pt verbalized understanding, safety maintained.
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
## 2015-04-21 LAB — LIPID PANEL
CHOLESTEROL: 183 mg/dL (ref 0–200)
HDL: 62 mg/dL (ref >40)
LDL Cholesterol: 109 mg/dL — ABNORMAL HIGH (ref 0–99)
TRIGLYCERIDES: 61 mg/dL (ref <150)
Total CHOL/HDL Ratio: 3 RATIO
VLDL: 12 mg/dL (ref 0–40)

## 2015-04-21 LAB — TSH: TSH: 2.84 u[IU]/mL (ref 0.350–4.500)

## 2015-04-21 MED ORDER — LORAZEPAM 1 MG PO TABS
1.0000 mg | ORAL_TABLET | Freq: Three times a day (TID) | ORAL | Status: DC | PRN
  Administered 2015-04-21 – 2015-04-22 (×2): 1 mg via ORAL
  Filled 2015-04-21 (×2): qty 1

## 2015-04-21 MED ORDER — LORAZEPAM 2 MG/ML IJ SOLN
INTRAMUSCULAR | Status: AC
  Filled 2015-04-21: qty 1

## 2015-04-21 MED ORDER — LORAZEPAM 2 MG/ML IJ SOLN
2.0000 mg | Freq: Once | INTRAMUSCULAR | Status: AC
  Administered 2015-04-21: 2 mg via INTRAMUSCULAR

## 2015-04-21 MED ORDER — DIPHENHYDRAMINE HCL 50 MG/ML IJ SOLN
INTRAMUSCULAR | Status: AC
  Filled 2015-04-21: qty 1

## 2015-04-21 MED ORDER — DIPHENHYDRAMINE HCL 50 MG PO CAPS
50.0000 mg | ORAL_CAPSULE | Freq: Once | ORAL | Status: AC
  Administered 2015-04-21: 50 mg via ORAL
  Filled 2015-04-21: qty 1
  Filled 2015-04-21: qty 2

## 2015-04-21 MED ORDER — LORAZEPAM 2 MG/ML IJ SOLN: 1.0000 mg | Freq: Three times a day (TID) | INTRAMUSCULAR | Status: DC | PRN

## 2015-04-21 MED ORDER — DIPHENHYDRAMINE HCL 50 MG/ML IJ SOLN
25.0000 mg | Freq: Once | INTRAMUSCULAR | Status: AC
  Administered 2015-04-21: 25 mg via INTRAMUSCULAR
  Filled 2015-04-21: qty 0.5

## 2015-04-21 MED ORDER — HYDROXYZINE HCL 50 MG PO TABS
50.0000 mg | ORAL_TABLET | Freq: Four times a day (QID) | ORAL | Status: DC | PRN
  Administered 2015-04-21 – 2015-04-22 (×2): 50 mg via ORAL
  Filled 2015-04-21 (×2): qty 1

## 2015-04-21 MED ORDER — LORAZEPAM 1 MG PO TABS
2.0000 mg | ORAL_TABLET | Freq: Once | ORAL | Status: AC
  Administered 2015-04-21: 2 mg via ORAL
  Filled 2015-04-21: qty 2

## 2015-04-21 NOTE — Progress Notes (Signed)
Nutrition Brief Note  Patient identified on the Malnutrition Screening Tool (MST) Report  Wt Readings from Last 15 Encounters:  04/19/15 200 lb (90.719 kg)  11/29/14 230 lb (104.327 kg)  03/23/14 265 lb (120.203 kg)  12/16/13 280 lb (127.007 kg)  09/20/13 281 lb (127.461 kg)  06/27/13 281 lb (127.461 kg)  10/23/12 300 lb (136.079 kg)  08/13/12 298 lb (135.172 kg)  01/23/12 275 lb (124.739 kg)    Body mass index is 35.44 kg/(m^2). Patient meets criteria for obese based on current BMI.   Pt was 2.0 in MST, per chart, 13.5%/30# severe wt loss in 4.5 months. Pt reports no appetite issues, but that wt loss was related to drug use.   Current diet order is regular , patient is consuming approximately unknown% of meals at this time. Labs and medications reviewed.   No nutrition interventions warranted at this time. If nutrition issues arise, please consult RD.   Fred AnoWilliam M. Read Bonelli, MS, RD LDN After Hours/Weekend Pager [phone]
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
## 2015-04-21 NOTE — Progress Notes (Signed)
Writer has spoke to patient earlier this evening and he c/o not being able to sleep for the past few nights. He did attend group this evening and has been up in the dayroom watching tv with peers. He reports that the seroquel does not work for him when taking his scheduled medications. He has been back up later and requested more medication for sleep. NP was called and was going to give an order for trazadone but patient reports that he can't take it because it makes his stomach upset. NP requested that his prn seroquel be given. He had reported to [facility] that he wanted a shot and feels that he has to act out in order to get this. Encouraged patient to speak with his doctor on tomorrow concerning sleep medication and how the seroquel doesn't work for him. Safety maintained on unit with 15 min checks.

## 2015-04-21 NOTE — [facility] Group Notes (Signed)
[facility] Group Notes:  (Clinical Social Work)  04/21/2015  1:15-2:15pm  Summary of Progress/Problems:   The main focus of today's process group was to   1)  discuss the importance of adding supports  2)  define health supports versus unhealthy supports  3)  identify the patient's current unhealthy supports and plan how to handle them  4)  Identify the patient's current healthy supports and plan what to add.  An emphasis was placed on using counselor, doctor, therapy groups, 12-step groups, and problem-specific support groups to expand supports.    The patient expressed full comprehension of the concepts presented, and agreed that there is a need to add more supports.  The patient asked in the middle of group why he keeps experiencing an inability to breathe during groups, and was helped to look at the possibility his anxiety was increasing as we touched on things important to him.  He was encouraged to do some breathing and to leave the room if he needed to, which he did for awhile.  When he returned, some music was played that he requested and he felt that helped a lot.  Type of Therapy:  Process Group with Motivational Interviewing  Participation Level:  Active  Participation Quality:  Attentive and Sharing  Affect:  Anxious, Blunted and Depressed  Cognitive:  Appropriate and Oriented  Insight:  Developing/Improving  Engagement in Therapy:  Engaged  Modes of Intervention:   Education, Support and Processing, Activity  Ambrose MantleMareida Grossman-Orr, LCSW 04/21/2015

## 2015-04-21 NOTE — [facility] Group Notes (Signed)
[facility] Group Notes:  (Nursing/MHT/Case Management/Adjunct)  Date:  04/21/2015  Time:  12:30 PM  Type of Therapy:  Psychoeducational Skills  Participation Level:  Did Not Attend  Participation Quality:  Did Not Attend  Affect:  Did Not Attend  Cognitive:  Did Not Attend  Insight:  None  Engagement in Group:  Did Not Attend  Modes of Intervention:  Did Not Attend  Summary of Progress/Problems: Pt did not attend patient self inventory group.   Brycin Kille Shanta 04/21/2015, 12:30 PM 

## 2015-04-21 NOTE — Progress Notes (Signed)
Patient ID: Fred MossesJohn Schadt Jr., male   DOB: Apr 18, 1986, 29 y.o.   MRN: [id number]  DAR: Pt. Denies SI/HI and A/V Hallucinations. He reports his sleep was poor, appetite is good, energy level is low, and concentration is poor. He rates depression and anxiety 10/10, and 8/10 for hopelessness. Patient continues to report pain and is receiving prn medication. Support and encouragement provided to the patient. Writer spoke 1:1 to patient about coping skills however patient states that he has none. "And yall won't drive my bike." Writer was encouraged to try deep breathing however patient stated that he isn't able to breathe deeply however could not provide a reason why. Patient is ruminating about medications and asks for anti anxiety medications frequently. Patient was observed in the dayroom watching football with his peers laughing and enjoying the program. However, when writer's presence was known by patient he asked for medication for agitation. Q15 minute checks are maintained for safety.

## 2015-04-22 LAB — PROLACTIN: PROLACTIN: 25.8 ng/mL — AB (ref 4.0–15.2)

## 2015-04-22 LAB — HEMOGLOBIN A1C
Hgb A1c MFr Bld: 5.7 % — ABNORMAL HIGH (ref 4.8–5.6)
MEAN PLASMA GLUCOSE: 117 mg/dL

## 2015-04-22 MED ORDER — CARBAMAZEPINE ER 200 MG PO TB12
200.0000 mg | ORAL_TABLET | Freq: Two times a day (BID) | ORAL | Status: DC
  Administered 2015-04-22 – 2015-04-24 (×4): 200 mg via ORAL
  Filled 2015-04-22 (×8): qty 1

## 2015-04-22 MED ORDER — CLONAZEPAM 1 MG PO TABS
1.0000 mg | ORAL_TABLET | Freq: Three times a day (TID) | ORAL | Status: DC | PRN
  Administered 2015-04-22 – 2015-04-26 (×12): 1 mg via ORAL
  Filled 2015-04-22 (×13): qty 1

## 2015-04-22 MED ORDER — QUETIAPINE FUMARATE 25 MG PO TABS
25.0000 mg | ORAL_TABLET | Freq: Three times a day (TID) | ORAL | Status: DC | PRN
  Administered 2015-04-23 – 2015-04-24 (×3): 25 mg via ORAL
  Filled 2015-04-22 (×3): qty 1

## 2015-04-22 MED ORDER — CLONAZEPAM 0.5 MG PO TABS
0.5000 mg | ORAL_TABLET | Freq: Once | ORAL | Status: AC
  Administered 2015-04-22: 0.5 mg via ORAL
  Filled 2015-04-22: qty 1

## 2015-04-22 MED ORDER — CLONAZEPAM 0.5 MG PO TABS
0.5000 mg | ORAL_TABLET | Freq: Four times a day (QID) | ORAL | Status: DC | PRN
  Administered 2015-04-22: 0.5 mg via ORAL
  Filled 2015-04-22: qty 1

## 2015-04-22 MED ORDER — DIPHENHYDRAMINE HCL 25 MG PO CAPS
25.0000 mg | ORAL_CAPSULE | Freq: Four times a day (QID) | ORAL | Status: DC | PRN
  Administered 2015-04-22 – 2015-04-24 (×3): 25 mg via ORAL
  Filled 2015-04-22 (×3): qty 1

## 2015-04-22 NOTE — [facility] Suicide Risk Assessment (Signed)
[facility] INPATIENT:  Family/Significant Other Suicide Prevention Education  Suicide Prevention Education:  Education Completed; Fred GriffinsDeborah Reynolds, Pt's mother [phone],  has been identified by the patient as the family member/significant other with whom the patient will be residing, and identified as the person(s) who will aid the patient in the event of a mental health crisis (suicidal ideations/suicide attempt).  With written consent from the patient, the family member/significant other has been provided the following suicide prevention education, prior to the and/or following the discharge of the patient.  The suicide prevention education provided includes the following:  Suicide risk factors  Suicide prevention and interventions  National Suicide Hotline telephone number  [facility] assessment telephone number  [facility] Emergency Assistance 911  [place] and/or Residential Mobile Crisis Unit telephone number  Request made of family/significant other to:  Remove weapons (e.g., guns, rifles, knives), all items previously/currently identified as safety concern.    Remove drugs/medications (over-the-counter, prescriptions, illicit drugs), all items previously/currently identified as a safety concern.  The family member/significant other verbalizes understanding of the suicide prevention education information provided.  The family member/significant other agrees to remove the items of safety concern listed above.  Fred Reynolds, Fred Reynolds 04/22/2015, 11:39 AM

## 2015-04-22 NOTE — Progress Notes (Addendum)
Patient has continually been asking to have his medications increased,talked very loud, walking fast in hallway, saying he doesn't want to get upset and we don't want to see him get upset.    MD wrote order for Klonipin 0.5 mg and patient complained that was not enough medication.  MD increased order to klonipin 1 mg.  Nurse gave patient klonipin 0.5 mg at 1511 and then another 0.5 mg Klonipin a few minutes later to equal 1 mg klonipin.  Patient upset and stated he is going to show his $$$$$$$.  Pharmacist aware of patient's demands throughout afternoon.
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
## 2015-04-22 NOTE — Plan of Care (Signed)
Problem: Ineffective individual coping Goal: STG:Pt. will utilize relaxation techniques to reduce stress STG: Patient will utilize relaxation techniques to reduce stress levels  Outcome: Not Progressing Patient request medications for his anxiety instead of using relaxation techniques first.

## 2015-04-22 NOTE — Progress Notes (Signed)
Patient has been rude and argumentative with Clinical research associatewriter most of the evening concerning his medications. He c/o having a panic attack and was having difficulty breathing. His vitals were taken at 1930 (120/59 bp, pulse 76, 99% o2) Patient was given a prn seroquel and encouraged to rest. Patient was up shortly after and attended evening group. He later came to nursing station requesting to speak to writer about his sleep medication and if there were any changes made. Writer informed him of prn's added but patient had taken both his vistaril and ativan before Clinical research associatewriter came on shift. Patient became upset and cursing and asking to leave because he reported that the seroquel did not work for him last night and Clinical research associatewriter encouraged him to speak with provider today about this and he was inquiring about xanax instead from providers note. Patient became rude with Clinical research associatewriter stating that I was not doing anything to help him and to call for something for sleep. Writer spoke with provider and a 1 time order was given for ativan 2 mg and benadryl 50 mg PO.  Writer informed patient with charge nurse present to witness our conversation.Patient returned to the dayroom and was observed playing cards with another peer. Writer suggested that he take his scheduled medications first and keep his ativan and benadryl for later before lying down and he was agreeable.  Safety maintained on unit with 15 min checks

## 2015-04-22 NOTE — [facility] Group Notes (Signed)
[facility]  04/22/2015 8:45 AM  Participation Quality: Alert, Appropriate and Oriented  Mood/Affect: Appropriate  Depression Rating: 6  Anxiety Rating: 10  Thoughts of Suicide: Pt denies SI/HI  Will you contract for safety? Yes  Current AVH: Pt denies  Plan for Discharge/Comments: Pt attended discharge planning group and actively participated in group. CSW discussed suicide prevention education with the group and encouraged them to discuss discharge planning and any relevant barriers. Pt expressed concern about night time medications and explained how he had "an episode" last night due to staff "not helping him."  Transportation Means: Pt reports access to transportation  Supports: No supports mentioned at this time  Chad CordialLauren Carter, LCSWA 04/22/2015 9:59 AM

## 2015-04-22 NOTE — Progress Notes (Signed)
D:  Patient's self inventory sheet, patient slept "awesome" last night, sleep medication is helpful.  Good appetite, normal energy level, poor concentration.  Rated depression and anxiety 10, hopeless 5.  Withdrawals, tremors, chilling, nausea, runny nose.  Denied SI.  Denied physical problems.  Physical pain, worst pain #8, back, legs, teeth.  No pain medication.  Goal is to work on Pharmacologistcoping skills.  Plans to pray about it.  "Thans for purring up with me."  No discharge plans.  No problems anticipated after discharge. A:  Medications administered per MD orders.  Emotional support and encouragement given patient. R:  Denied SI and HI, contracts for safety.  Denied A/V hallucinations.  Safety maintained with 15 minute checksl. Patient stated he was HI last night to nurse.  Patient stated he was angry about his sleep medication.  "I don't know, I yelled alittle.  Happy now that I slept, medicine worked, immune to seroquel."

## 2015-04-22 NOTE — Progress Notes (Signed)
D   Pt is pleasant on approach and cooperative   He is much calmer since getting clonopin and is more social and interactive on the milieu    He has attended groups and was appropriate and supportive of his peers A   Verbal support given   Medications administered and effectiveness monitored    Q 15 min checks R   Pt safe at present and receptive to verbal support

## 2015-04-22 NOTE — [facility] Group Notes (Signed)
[facility] LCSW Group Therapy  04/22/2015 1:15pm  Type of Therapy:  Group Therapy vercoming Obstacles  Pt did not attend, declined invitation.   Harjit Leider Carter, LCSWA 04/22/2015 4:02 PM  

## 2015-04-22 NOTE — Plan of Care (Signed)
Problem: Consults Goal: Depression Patient Education See Patient Education Module for education specifics.  Outcome: Progressing Nurse discussed depression/coping skills with patient.        

## 2015-04-22 NOTE — Progress Notes (Signed)
Adult Psychoeducational Group Note  Date:  04/22/2015 Time:  9:28 PM  Group Topic/Focus:  Wrap-Up Group:   The focus of this group is to help patients review their daily goal of treatment and discuss progress on daily workbooks.  Participation Level:  Active  Participation Quality:  Monopolizing and Attentive  Affect:  Appropriate  Cognitive:  Appropriate  Insight: Appropriate  Engagement in Group:  Engaged  Modes of Intervention:  Discussion  Additional Comments:  Pt rated his day a 6/10. Pt stated he had an issue this morning that got him down, but he was able to bring himself back up. Pt stated his goal for today was to do 100 push ups of which he had completed 60 at the time. Pt stated one positive thing was that he did not throw any chairs.  Caswell CorwinOwen, Chanique Duca C 04/22/2015, 9:28 PM
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
## 2015-04-22 NOTE — Progress Notes (Signed)
[facility] Group Notes:  (Nursing/MHT/Case Management/Adjunct)  Date:  04/22/2015  Time:  12:52 AM  Type of Therapy:  Psychoeducational Skills  Participation Level:  Active  Participation Quality:  Monopolizing  Affect:  Excited  Cognitive:  Disorganized  Insight:  Lacking  Engagement in Group:  Monopolizing  Modes of Intervention:  Education  Summary of Progress/Problems: The patient shared in group that he had a good day overall and that he received some rest during the daytime. He did complain that he was upset over the medication that had been prescribed for him. As a theme for the day, he indicated that his support system will be comprised of his mother  Westly PamGOODMAN, Trica Usery S 04/22/2015, 12:52 AM

## 2015-04-23 NOTE — Progress Notes (Signed)
Pt was observed to be upset on the unit, requested to speak with CSW. CSW approached Pt in his room and he was laying in the bed. Pt expressed that we (MD and CSW) had ruined his day because we were "kicking him out tomorrow." Pt said multiple times that he wished he was dead, that he would go out and kill himself if he was discharged, and that he would go straight to get high if he is discharged. Pt continues to place blame on others and avoids taking accountability for his behaviors.  MD and RN notified.   Chad CordialLauren Carter, LCSWA Clinical Social Work [phone]

## 2015-04-23 NOTE — Tx Team (Signed)
Interdisciplinary Treatment Plan Update (Adult) Date: 04/23/2015   Date: 04/23/2015 9:22 AM  Progress in Treatment:  Attending groups: Intermittently Participating in groups: Yes  Taking medication as prescribed: Yes  Tolerating medication: Yes  Family/Significant othe contact made: Yes, with mother Patient understands diagnosis: Yes Discussing patient identified problems/goals with staff: Yes  Medical problems stabilized or resolved: Yes  Denies suicidal/homicidal ideation: Yes Patient has not harmed self or Others: Yes   New problem(s) identified: None identified at this time.   Discharge Plan or Barriers: Pt will return home and follow-up with ADS  Additional comments: n/a   Reason for Continuation of Hospitalization:  Anxiety Depression Medication stabilization Suicidal ideation Withdrawal symptoms  Estimated length of stay: 3-5 days  Review of initial/current patient goals per problem list:   1.  Goal(s): Patient will participate in aftercare plan  Met:  Yes  Target date: 3-5 days from date of admission   As evidenced by: Patient will participate within aftercare plan AEB aftercare provider and housing plan at discharge being identified.   11/115/16: Pt will discharge home and follow-up with ADS  2.  Goal (s): Patient will exhibit decreased depressive symptoms and suicidal ideations.  Met:  No  Target date: 3-5 days from date of admission   As evidenced by: Patient will utilize self rating of depression at 3 or below and demonstrate decreased signs of depression or be deemed stable for discharge by MD.  04/23/15: Pt rates depression at 6/10; denies SI.  3.  Goal(s): Patient will demonstrate decreased signs and symptoms of anxiety.  Met:  No  Target date: 3-5 days from date of admission   As evidenced by: Patient will utilize self rating of anxiety at 3 or below and demonstrated decreased signs of anxiety, or be deemed stable for discharge by  MD  04/23/15: Pt rates anxiety at 10/10, demanding of medications 4.  Goal(s): Patient will demonstrate decreased signs of withdrawal due to substance abuse  Met:  No  Target date: 3-5 days from date of admission   As evidenced by: Patient will produce a CIWA/COWS score of 0, have stable vitals signs, and no symptoms of withdrawal - 04/23/15: Pt COWS score of 3, endorsing restlessness and anxiety as symptoms of withdrawal.  Attendees:  Patient:    Family:    Physician: Dr. Parke Poisson, MD  04/23/2015 9:22 AM  Nursing: Lars Pinks, RN Case manager  04/23/2015 9:22 AM  Clinical Social Worker Norman Clay, MSW 04/23/2015 9:22 AM  Other: Lucinda Dell, Beverly Sessions Liasion 04/23/2015 9:22 AM  Clinical: Grayland Ormond, RN 04/23/2015 9:22 AM  Other: , RN Charge Nurse 04/23/2015 9:22 AM  Other:     Peri Maris, [place] MSW

## 2015-04-23 NOTE — Progress Notes (Addendum)
D:  Patient's self inventory sheet, patient slept good last night "great", sleep medication was good.  Good appetite, normal energy level, good concentration.  Rated depression 5, hopeless and anxiety 7.  Withdrawals, chilling, cravings, runny nose, irritability.  Denied physical problems.  Physical pain, back/neck, worst pain #3 in past 24 hours.  Goal is to try to control anger and outburst, try to take time and be considerate to others.  Plans to stay focus and keep moving forward.  "I'm sorry for my outburst and I am trying to work on myl anger and frustrations."  No discharge plans. A:  Medications administered per MD orders.  Emotional support and encouragement given patient. R:  Patient denied SI this morning while talking to nurse.  Then after talking to MD about Wed discharge, patient stated he is suicidal, does not know where he will be going, stated his parents believe he needs to go somewhere for help before coming home .  Contracts for safety.  Denied HI.  Denied A/V hallucinations.  Safety maintained with 15 minute checks.  Patient will talk with MD again about discharge.

## 2015-04-23 NOTE — [facility] Group Notes (Signed)
[place] LCSW Group Therapy 04/23/2015 1:15 PM  Type of Therapy: Group Therapy- Feelings about Diagnosis  Participation Level: Intrusive  Participation Quality:  Demanding, Critical  Affect:  Appropriate  Cognitive: Alert and Oriented   Insight:  Limited  Engagement in Therapy: Developing/Improving and Engaged   Modes of Intervention: Clarification, Confrontation, Discussion, Education, Exploration, Limit-setting, Orientation, Problem-solving, Rapport Building, Art therapist, Socialization and Support  Description of Group:   This group will allow patients to explore their thoughts and feelings about diagnoses they have received. Patients will be guided to explore their level of understanding and acceptance of these diagnoses. Facilitator will encourage patients to process their thoughts and feelings about the reactions of others to their diagnosis, and will guide patients in identifying ways to discuss their diagnosis with significant others in their lives. This group will be process-oriented, with patients participating in exploration of their own experiences as well as giving and receiving support and challenge from other group members.  Summary of Progress/Problems:  Pt was disruptive in group, blaming CSW for ruining his day. Pt continued to criticize CSW and others in the system for "not helping the people that need help." Pt also fed into complaints of another peer who also criticized CSW and mental health system. Pt interrupted others to change the subject to things that were bothering him. Pt reported that he has met many people (currently in the hospital) who are supportive of him.   Therapeutic Modalities:   Cognitive Behavioral Therapy Solution Focused Therapy Motivational Interviewing Relapse Prevention Therapy  Peri Maris, LCSWA 04/23/2015 3:29 PM

## 2015-04-23 NOTE — [facility] Group Notes (Signed)
Adult Psychoeducational Group Note  Date:  04/23/2015 Time:  11:55 PM  Group Topic/Focus:  Wrap-Up Group:   The focus of this group is to help patients review their daily goal of treatment and discuss progress on daily workbooks.  Participation Level:  Active  Participation Quality:  Appropriate  Affect:  Appropriate  Cognitive:  Appropriate  Insight: Appropriate  Engagement in Group:  Engaged  Modes of Intervention:  Discussion  Additional Comments:  Patient stated his day started out very good day and evening.  His goal was to workout some of his anger and that he didn't complete his goal.  He stated he did some exercises.  He expressed he is also working on a discharge plan.  He also stated he was scheduled to leave tomorrow but that has been changed.  Caroll RancherLindsay, Zelphia Glover A 04/23/2015, 11:55 PM

## 2015-04-23 NOTE — Plan of Care (Signed)
Problem: Consults Goal: Substance Abuse Patient Education See Patient Education Module for education specifics.  Outcome: Progressing Nurse discussed substance abuse/depression/coping skills with patient.

## 2015-04-23 NOTE — Progress Notes (Signed)
The focus of this group is to educate the patient on the purpose and policies of crisis stabilization and provide a format to answer questions about their admission.  The group details unit policies and expectations of patients while admitted.  Patient attended 0900 nurse education orientation group this morning.  Patient actively participated and had appropriate affect.  Patient was alert.  Patient had appropriate insight and appropriate engagement.  Today patient will work on 3 goals for discharge.  Patient plans to exercise, physical activity helps him deal with stress.  Patient realizes that he cannot please everybody.

## 2015-04-23 NOTE — Progress Notes (Signed)
Recreation Therapy Notes  Animal-Assisted Activity (AAA) Program Checklist/Progress Notes Patient Eligibility Criteria Checklist & Daily Group note for Rec Tx Intervention  Date: 11.15.2016 Time: 2:45pm Location: [address] Dayroom    AAA/T Program Assumption of Risk Form signed by Patient/ or Parent Legal Guardian yes  Patient is free of allergies or sever asthma yes  Patient reports no fear of animals yes  Patient reports no history of cruelty to animals yes  Patient understands his/her participation is voluntary yes  Patient washes hands before animal contact yes  Patient washes hands after animal contact yes  Behavioral Response: Appropriate   Education: Hand Washing, Appropriate Animal Interaction   Education Outcome: Acknowledges education.   Clinical Observations/Feedback: Patient appropriately interacted with therapy dog and peers during session.   Fred Reynolds L Amarachi Kotz, LRT/CTRS        Sorcha Rotunno L 04/23/2015 4:00 PM 
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
## 2015-04-24 MED ORDER — CITALOPRAM HYDROBROMIDE 20 MG PO TABS
20.0000 mg | ORAL_TABLET | Freq: Every day | ORAL | Status: DC
  Administered 2015-04-25 – 2015-04-26 (×2): 20 mg via ORAL
  Filled 2015-04-24 (×3): qty 1
  Filled 2015-04-24: qty 7

## 2015-04-24 MED ORDER — QUETIAPINE FUMARATE 25 MG PO TABS
25.0000 mg | ORAL_TABLET | Freq: Two times a day (BID) | ORAL | Status: DC | PRN
  Administered 2015-04-25 (×2): 25 mg via ORAL

## 2015-04-24 MED ORDER — DIVALPROEX SODIUM ER 500 MG PO TB24
500.0000 mg | ORAL_TABLET | Freq: Every day | ORAL | Status: DC
  Administered 2015-04-24: 500 mg via ORAL
  Filled 2015-04-24 (×2): qty 1

## 2015-04-24 MED ORDER — QUETIAPINE FUMARATE 25 MG PO TABS
25.0000 mg | ORAL_TABLET | Freq: Three times a day (TID) | ORAL | Status: DC
  Administered 2015-04-24 – 2015-04-26 (×6): 25 mg via ORAL
  Filled 2015-04-24 (×3): qty 1
  Filled 2015-04-24: qty 21
  Filled 2015-04-24 (×9): qty 1
  Filled 2015-04-24: qty 21
  Filled 2015-04-24: qty 1
  Filled 2015-04-24: qty 21
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
## 2015-04-24 NOTE — Progress Notes (Signed)
D   Pt is calm and cooperative   He can be intrusive at times   He interacts appropriately with others and attends groups and participates well   He said his medications were finally right and he is happy about that A   Verbal support given   Medications administered and effectiveness monitored   Q 15 min checks R   Pt safe at present and receptive to verbal support

## 2015-04-24 NOTE — Progress Notes (Signed)
Adult Psychoeducational Group Note  Date:  04/24/2015 Time:  9:20 PM  Group Topic/Focus:  Wrap-Up Group:   The focus of this group is to help patients review their daily goal of treatment and discuss progress on daily workbooks.  Participation Level:  Active  Participation Quality:  Appropriate  Affect:  Appropriate  Cognitive:  Alert  Insight: Appropriate  Engagement in Group:  Engaged  Modes of Intervention:  Discussion  Additional Comments:  Patient stated his goal for today was to treat everybody right and not to spaz on anyone. Patient stated he did not meet his goal. Patient stated something positve that happened today was his mom and bro came to visit.  Edu On L Nanami Whitelaw 04/24/2015, 9:20 PM

## 2015-04-24 NOTE — [facility] Group Notes (Signed)
[facility] LCSW Group Therapy 04/24/2015 1:15 PM  Type of Therapy: Group Therapy- Emotion Regulation  Pt came into group briefly, shutting the door loudly, apologizing loudly. Pt was distracting, holding side conversations. Pt left early, disrupting another person's comment and did not return.  Chad CordialLauren Carter, LCSWA 04/24/2015 3:16 PM

## 2015-04-24 NOTE — Progress Notes (Signed)
D   Pt is pleasant on approach and cooperative   He is much calmer since getting clonopin and is more social and interactive on the milieu    He has attended groups and was appropriate and supportive of his peers  He requested to get his seroquel early because he sadi the other pts say he is getting too loud A   Verbal support given   Medications administered and effectiveness monitored    Q 15 min checks R   Pt safe at present and receptive to verbal support

## 2015-04-24 NOTE — Progress Notes (Signed)
D: . Pt labile and threatening. Pt med seeking and make threats in order to seek meds. Pt intrusive and attention seeking. Pt reports that he feels like hurting someone or destroying things. Writer explained to pt the importance of keeping the milieu safe and if needed, law enforcement will be notified. Pt agreed to rest in bed and allow time for prn med to become effective. Writer has spoken to pt one on one several times this morning d/t aggressive and manipulating behaviors. Pt rates depression 8/10. Anxiety 10/10. Pt denies suicidal thoughts. A: Medications administered as ordered per MD. Verbal support given. Pt encouraged to attend groups. 15 minute checks performed for safety. Consulting civil engineerCharge RN., made aware of pt threats. Dr. Jama Flavorsobos and CSW made aware of pt threats and inappropriate behaviors. R: Pt safety  maintained.

## 2015-04-24 NOTE — [facility] Group Notes (Signed)
[facility]  04/24/2015 8:45 AM  Participation Quality: Alert, Appropriate and Oriented  Mood/Affect: Appropriate  Depression Rating: 5  Anxiety Rating: 10  Thoughts of Suicide: Pt denies SI/HI  Will you contract for safety? Yes  Current AVH: Pt denies  Plan for Discharge/Comments: Pt attended discharge planning group and actively participated in group. CSW discussed suicide prevention education with the group and encouraged them to discuss discharge planning and any relevant barriers. Pt is observed to be laughing with peers, continues to request rehab "far away" despite being told that it will not be possible.   Transportation Means: Pt reports access to transportation  Supports: No supports mentioned at this time  Chad CordialLauren Carter, LCSWA 04/24/2015 9:40 AM

## 2015-04-25 MED ORDER — DIVALPROEX SODIUM ER 250 MG PO TB24
750.0000 mg | ORAL_TABLET | Freq: Every day | ORAL | Status: DC
  Administered 2015-04-25: 750 mg via ORAL
  Filled 2015-04-25: qty 3
  Filled 2015-04-25: qty 21
  Filled 2015-04-25: qty 3

## 2015-04-25 MED ORDER — HYDROXYZINE HCL 25 MG PO TABS
25.0000 mg | ORAL_TABLET | Freq: Three times a day (TID) | ORAL | Status: DC | PRN
  Administered 2015-04-25: 25 mg via ORAL
  Filled 2015-04-25: qty 1

## 2015-04-25 MED ORDER — DIPHENHYDRAMINE HCL 25 MG PO CAPS: 50.0000 mg | ORAL_CAPSULE | Freq: Four times a day (QID) | ORAL | Status: DC | PRN

## 2015-04-25 MED ORDER — DIPHENHYDRAMINE HCL 25 MG PO CAPS
25.0000 mg | ORAL_CAPSULE | Freq: Four times a day (QID) | ORAL | Status: DC | PRN
  Administered 2015-04-25: 25 mg via ORAL
  Filled 2015-04-25: qty 1

## 2015-04-25 NOTE — [facility] Group Notes (Signed)
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
## 2015-04-25 NOTE — [facility] Group Notes (Signed)
Adult Psychoeducational Group Note  Date:  04/25/2015 Time:  9:34 PM  Group Topic/Focus:  Wrap-Up Group:   The focus of this group is to help patients review their daily goal of treatment and discuss progress on daily workbooks.  Participation Level:  Active  Participation Quality:  Appropriate  Affect:  Appropriate  Cognitive:  Appropriate  Insight: Good  Engagement in Group:  Engaged  Modes of Intervention:  Discussion  Additional Comments:  Patient stated his goal was not to curse.  He stated his goal was pretty good.  His dad came to visit and they worked some things out.  He stated he may leave tomorrow, Saturday or Monday going [place] treatment [place].  Lillia AbedLindsay, Jarvis Knodel A 04/25/2015, 9:34 PM

## 2015-04-25 NOTE — Tx Team (Signed)
Interdisciplinary Treatment Plan Update (Adult) Date: 04/25/2015   Date: 04/25/2015 1:43 PM  Progress in Treatment:  Attending groups: Intermittently Participating in groups: Yes, however is disruptive  Taking medication as prescribed: Yes  Tolerating medication: Yes  Family/Significant othe contact made: Yes, with mother Patient understands diagnosis: Yes Discussing patient identified problems/goals with staff: Yes  Medical problems stabilized or resolved: Yes  Denies suicidal/homicidal ideation: Yes Patient has not harmed self or Others: Yes   New problem(s) identified: None identified at this time.   Discharge Plan or Barriers: Pt will return home and follow-up with [facility]  04/25/15: Pt is planning to go to the [facility]. CSW will assess for follow-up options in [place].  Additional comments: n/a   Reason for Continuation of Hospitalization:  Anxiety Depression Medication stabilization Suicidal ideation Withdrawal symptoms  Estimated length of stay: 1-2 days  Review of initial/current patient goals per problem list:   1.  Goal(s): Patient will participate in aftercare plan  Met:  Yes  Target date: 3-5 days from date of admission   As evidenced by: Patient will participate within aftercare plan AEB aftercare provider and housing plan at discharge being identified.   11/115/16: Pt will discharge home and follow-up with [facility]  04/25/15: Pt will discharge to [facility]; CSW will schedule follow-up.  2.  Goal (s): Patient will exhibit decreased depressive symptoms and suicidal ideations.  Met:  No  Target date: 3-5 days from date of admission   As evidenced by: Patient will utilize self rating of depression at 3 or below and demonstrate decreased signs of depression or be deemed stable for discharge by MD.  04/23/15: Pt rates depression at 6/10; denies SI.  04/25/15: Pt rates depression at 5/10; denies SI  3.  Goal(s): Patient will  demonstrate decreased signs and symptoms of anxiety.  Met:  No  Target date: 3-5 days from date of admission   As evidenced by: Patient will utilize self rating of anxiety at 3 or below and demonstrated decreased signs of anxiety, or be deemed stable for discharge by MD  04/23/15: Pt rates anxiety at 10/10, demanding of medications  04/25/15: Pt continues to rate anxiety at 10/10. 4.  Goal(s): Patient will demonstrate decreased signs of withdrawal due to substance abuse  Met:  No  Target date: 3-5 days from date of admission   As evidenced by: Patient will produce a CIWA/COWS score of 0, have stable vitals signs, and no symptoms of withdrawal - 04/23/15: Pt COWS score of 3, endorsing restlessness and anxiety as symptoms of withdrawal.    -04/25/15: Pt has COWS score of 1; endorses anxiety.  Attendees:  Patient:    Family:    Physician: Dr. Parke Poisson, MD  04/25/2015 1:43 PM  Nursing: Lars Pinks, RN Case manager  04/25/2015 1:43 PM  Clinical Social Worker Norman Clay, MSW 04/25/2015 1:43 PM  Other: Lucinda Dell, Beverly Sessions Liasion 04/25/2015 1:43 PM  Clinical: Grayland Ormond, RN 04/25/2015 1:43 PM  Other: , RN Charge Nurse 04/25/2015 1:43 PM  Other:     Peri Maris, [place] MSW

## 2015-04-25 NOTE — [facility] Group Notes (Signed)
[facility] 04/25/2015 1:15pm  Type of Therapy: [facility] Presentation  Pt did not attend, declined invitation.    Chad CordialLauren Carter, LCSWA 04/25/2015 1:42 PM

## 2015-04-25 NOTE — Plan of Care (Signed)
Problem: Consults Goal: Depression Patient Education See Patient Education Module for education specifics.  Outcome: Progressing Nurse discussed anxiety/depression/coping skills with patient.

## 2015-04-25 NOTE — Progress Notes (Signed)
D:  Patient's self inventory sheet, patient sleeps good, sleep medication is helpful.  Good appetite, normal energy level, poor concentration.  Rated depression 8, hopeless 7, anxiety 10.  Withdrawals, cravings, agitation, irritability.  Denied SI.  Physical problems are pain, sore legs/back, headaches.  Plans to not curse, watch his language.  Plans to watch his mouth, think before he speaks.  No discharge plans.  "I don't have any clue what to do."     A:  Medications administered per MD orders.  Emotional support and encouragement given patient. R:  Denied SI and HI, contracts for safety.  Denied A/V hallucinations.  Safety maintaned with 15 minute checks.

## 2015-04-26 MED ORDER — QUETIAPINE FUMARATE 200 MG PO TABS: 200.0000 mg | ORAL_TABLET | Freq: Every day | ORAL | Status: DC

## 2015-04-26 MED ORDER — CLONAZEPAM 1 MG PO TABS: 1.0000 mg | ORAL_TABLET | Freq: Three times a day (TID) | ORAL | Status: DC | PRN

## 2015-04-26 MED ORDER — NICOTINE 21 MG/24HR TD PT24: 21.0000 mg | MEDICATED_PATCH | Freq: Every day | TRANSDERMAL | Status: DC

## 2015-04-26 MED ORDER — DIVALPROEX SODIUM ER 250 MG PO TB24: 750.0000 mg | ORAL_TABLET | Freq: Every day | ORAL | Status: DC

## 2015-04-26 MED ORDER — CITALOPRAM HYDROBROMIDE 20 MG PO TABS: 20.0000 mg | ORAL_TABLET | Freq: Every day | ORAL | Status: DC

## 2015-04-26 MED ORDER — QUETIAPINE FUMARATE 25 MG PO TABS: 25.0000 mg | ORAL_TABLET | Freq: Three times a day (TID) | ORAL | Status: DC

## 2015-04-26 NOTE — Progress Notes (Signed)
Patient ID: Fred Cheema., male   DOB: Dec 26, 1985, 29 y.o.   MRN: [id number] D: Patient stated feeling anxious after talking to his father about his addiction. Pt stated he "got stuff off his chest". Pt rated anxiety as 10 on a 0-10 scale. Pt reports he is sleeping well on current medication regime. Pt denies SI/HI/AVH and pain. Pt attended and participated in evening wrap up group. Cooperative with assessment.   A: Met with pt 1:1. Medications administered as prescribed. Support and encouragement provided.   R: Patient remains safe and complaint with medications.

## 2015-04-26 NOTE — Plan of Care (Signed)
Problem: Alteration in mood & ability to function due to Goal: STG-Patient will attend groups Outcome: Progressing Pt attended evening wrap up group     
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
## 2015-04-26 NOTE — Progress Notes (Signed)
  [facility] :  Will you be returning to the same living situation after discharge:  No, [facility] At discharge, do you have transportation home?: Yes,  Pt friend provided transportation Do you have the ability to pay for your medications: Yes,  Pt provided with supply and prescriptions  Release of information consent forms completed and in the chart;  Patient's signature needed at discharge.  Patient to Follow up at: Follow-up Information    Follow up with [facility] On 04/29/2015.   Why:  at 9:00am. Please bring photo ID, discharge papers, proof of living situation, and medications. This is an initial assessment for medication management and therapy.   Contact information:   [address] [place] [id number] [phone] Fax: [phone]      Next level of care provider has access to [facility] Link:no  Patient denies SI/HI: Yes,  Pt denies    Safety Planning and Suicide Prevention discussed: Yes,  with mother; see SPE note for further details  Have you used any form of tobacco in the last 30 days? (Cigarettes, Smokeless Tobacco, Cigars, and/or Pipes): Yes  Has patient been referred to the Quitline?: Patient refused referral  Elaina HoopsCarter, Paighton Godette M 04/26/2015, 3:50 PM

## 2015-04-26 NOTE — Progress Notes (Addendum)
D: Pt D/C home as ordered. Denied SI, HI, AVH and pain at the time. Pt caught a ride from another peer who was d/c going to his destination.  A: D/C instructions reviewed with patient including medications (dosage & time) and follow up appointment as well. All belongings in locker 16 returned to patient prior to departure. Vitals WNL. Q 15 minutes checks remained effective for safety till time of d/c without gestures or event of self injurious behavior to report.  R: Pt verbalized understanding of d/c instructions when reviewed. Signed belonging sheet in agreement with items received. Denied adverse drug reactions when assessed. No physical distress evident at time of departure from facility. Safety maintained.

## 2015-04-26 NOTE — BHH Suicide Risk Assessment (Signed)
[facility]   Demographic Factors:  29 year old male, single, was living with parents  Total Time spent with patient: 30 minutes  Musculoskeletal: Strength & Muscle Tone: within normal limits Gait & Station: normal Patient leans: N/A  Psychiatric Specialty Exam: Physical Exam  ROS  Blood pressure 126/84, pulse 69, temperature 97.4 F (36.3 C), temperature source Oral, resp. rate 18, height 5\' 3"  (1.6 m), weight 200 lb (90.719 kg), SpO2 100 %.Body mass index is 35.44 kg/(m^2).  General Appearance: Fairly Groomed  Patent attorneyye Contact::  Good  Speech:  Normal Rate   Volume:  Normal  Mood:  still labile, but improved compared to admission- less irritable   Affect:  Appropriate, less irritable , not currently angry or explosive   Thought Process:  Linear  Orientation:  Full (Time, Place, and Person)  Thought Content:  no hallucinations, no delusions  Suicidal Thoughts:  No- denies any suicidal ideations, denies any self injurious ideations  Homicidal Thoughts:  No denies any thoughts of harming anyone or any violent ideations at this time  Memory:  recent and remote grossly intake   Judgement:  Fair  Insight:  Fair  Psychomotor Activity:  Normal- no current psychomotor agitation   Concentration:  Good  Recall:  Good  Fund of Knowledge:Good  Language: Good  Akathisia:  No  Handed:  Right  AIMS (if indicated):     Assets:  Communication Skills Desire for Improvement Resilience  Sleep:  Number of Hours: 6.5  Cognition: WNL  ADL's:  Intact   Have you used any form of tobacco in the last 30 days? (Cigarettes, Smokeless Tobacco, Cigars, and/or Pipes): Yes  Has this patient used any form of tobacco in the last 30 days? (Cigarettes, Smokeless Tobacco, Cigars, and/or Pipes) Yes, A prescription for an FDA-approved tobacco cessation medication was offered at discharge and the patient refused  Mental Status Per Nursing Assessment::   On Admission:     Current  Mental Status by Physician: At this time patient is improved compared to his admission presentation. Improved grooming, improved eye contact. Mood is improved-  In particular, he is less irritable, less angry, and today affect is still somewhat labile, but generally significantly improved,no psychomotor agitation or restlessness, no thought disorder, denies suicidal or homicidal ideations, remains future oriented . Of note, staff reports that patient's mood has been improved, and that he has been noted to present with full range of affect in group interactions, when interacting peers . He has needed redirection from staff due to behaviors such as flirtation, inappropriate comments   Loss Factors: Unemployment, limited support network outside family, history of substance depedendence  Historical Factors: Patient has history of  Opiate dependence, history of Bipolar Spectrum Disorder / PTSD . Describes long history of Impulsivity.   Risk Reduction Factors:   Sense of responsibility to family, Positive social support and Positive coping skills or problem solving skills  Continued Clinical Symptoms:  At this time patient is partially improved compared to admission.  As noted, at this time no SI, no HI, no psychotic symptoms, and states medications are well tolerated .   Cognitive Features That Contribute To Risk:  Recent and remote grossly intact   Suicide Risk:  Mild:  Suicidal ideation of limited frequency, intensity, duration, and specificity.  There are no identifiable plans, no associated intent, mild dysphoria and related symptoms, good self-control (both objective and subjective assessment), few other risk factors, and identifiable protective factors, including available and accessible  social support.  Principal Problem: Bipolar disorder, curr episode mixed, severe, w/o psychotic features [facility]) Discharge Diagnoses:  Patient Active Problem List   Diagnosis Date Noted  . Bipolar disorder,  curr episode mixed, severe, w/o psychotic features (HCC) [F31.63] 04/20/2015  . PTSD (post-traumatic stress disorder) [F43.10] 04/20/2015  . Opioid use disorder, severe, dependence (HCC) [F11.20] 04/20/2015  . Opioid withdrawal (HCC) [F11.23] 04/20/2015  . Cannabis use disorder, severe, dependence (HCC) [F12.20] 04/20/2015  . Cocaine use disorder, moderate, dependence (HCC) [F14.20] 04/20/2015  . Tobacco use disorder [F17.200] 04/20/2015    Follow-up Information    Follow up with [facility] On 04/30/2015.   Why:  at 8:00am for your initial screening.    Contact information:   [address] [place], [place] [address] Phone: [phone]      Plan Of Care/Follow-up recommendations:  Activity:  as tolerated  Diet:  Regular Tests:  NA Other:  See below   Is patient on multiple antipsychotic therapies at discharge:  No   Has Patient had three or more failed trials of antipsychotic monotherapy by history:  No  Recommended Plan for Multiple Antipsychotic Therapies: NA  Patient is requesting discharge and there are currently no grounds for involuntary commitment . He plans to go to [facility] or [facility],  but states it may be early next week, and at this time is considering returning to parents' home . We encouraged ongoing abstinence and relapse prevention efforts, maintaining distance from people, places and things he might associate with drug use, in order to decrease cravings/ triggers Encouraged to attend 12 step programs     Jarelyn Bambach 04/26/2015, 11:36 AM

## 2015-04-26 NOTE — Tx Team (Signed)
Interdisciplinary Treatment Plan Update (Adult) Date: 04/26/2015   Date: 04/26/2015 3:46 PM  Progress in Treatment:  Attending groups: Intermittently Participating in groups: Yes, however is disruptive  Taking medication as prescribed: Yes  Tolerating medication: Yes  Family/Significant othe contact made: Yes, with mother Patient understands diagnosis: Yes Discussing patient identified problems/goals with staff: Yes  Medical problems stabilized or resolved: Yes  Denies suicidal/homicidal ideation: Yes Patient has not harmed self or Others: Yes   New problem(s) identified: None identified at this time.   Discharge Plan or Barriers: Pt will return home and follow-up with [facility]  04/25/15: Pt is planning to go to the [facility]. CSW will assess for follow-up options in [place].  Additional comments: n/a   Reason for Continuation of Hospitalization:  Anxiety Depression Medication stabilization Suicidal ideation Withdrawal symptoms  Estimated length of stay: 0 days; Pt stable for DC  Review of initial/current patient goals per problem list:   1.  Goal(s): Patient will participate in aftercare plan  Met:  Yes  Target date: 3-5 days from date of admission   As evidenced by: Patient will participate within aftercare plan AEB aftercare provider and housing plan at discharge being identified.   11/115/16: Pt will discharge home and follow-up with [facility]  04/25/15: Pt will discharge to [facility]; CSW will schedule follow-up.  2.  Goal (s): Patient will exhibit decreased depressive symptoms and suicidal ideations.  Met:  Adequate for DC  Target date: 3-5 days from date of admission   As evidenced by: Patient will utilize self rating of depression at 3 or below and demonstrate decreased signs of depression or be deemed stable for discharge by MD.  04/23/15: Pt rates depression at 6/10; denies SI.  04/25/15: Pt rates depression at 5/10; denies  SI 04/26/2015: MD feels that Pt's symptoms have decreased to the point that they can be managed in an outpatient setting.   3.  Goal(s): Patient will demonstrate decreased signs and symptoms of anxiety.  Met:  Adequate for DC  Target date: 3-5 days from date of admission   As evidenced by: Patient will utilize self rating of anxiety at 3 or below and demonstrated decreased signs of anxiety, or be deemed stable for discharge by MD  04/23/15: Pt rates anxiety at 10/10, demanding of medications  04/25/15: Pt continues to rate anxiety at 10/10. 04/26/2015: MD feels that Pt's symptoms have decreased to the point that they can be managed in an outpatient setting.  4.  Goal(s): Patient will demonstrate decreased signs of withdrawal due to substance abuse  Met:  Adequate for DC  Target date: 3-5 days from date of admission   As evidenced by: Patient will produce a CIWA/COWS score of 0, have stable vitals signs, and no symptoms of withdrawal - 04/23/15: Pt COWS score of 3, endorsing restlessness and anxiety as symptoms of withdrawal.    -04/25/15: Pt has COWS score of 1; endorses anxiety.    -04/26/2015: MD feels that Pt's symptoms have decreased to the point that they can be managed in an    outpatient setting.   Attendees:  Patient:    Family:    Physician: Dr. Parke Poisson, MD  04/26/2015 3:46 PM  Nursing: Lars Pinks, RN Case manager  04/26/2015 3:46 PM  Clinical Social Worker Norman Clay, MSW 04/26/2015 3:46 PM  Other: Jake Bathe Liasion 04/26/2015 3:46 PM  Clinical: Keane Police,  RN 04/26/2015 3:46 PM  Other: , RN Charge Nurse 04/26/2015 3:46 PM  Other:     Peri Maris, Latanya Presser MSW

## 2015-06-21 ENCOUNTER — Encounter (HOSPITAL_COMMUNITY): Payer: Self-pay | Admitting: Emergency Medicine

## 2015-06-21 ENCOUNTER — Emergency Department (HOSPITAL_COMMUNITY)
Admission: EM | Admit: 2015-06-21 | Discharge: 2015-06-22 | Disposition: A | Discharge status: Other Acute Inpatient Hospital | Payer: Federal, State, Local not specified - Other | Attending: Emergency Medicine | Admitting: Emergency Medicine

## 2015-06-21 DIAGNOSIS — J45909 Unspecified asthma, uncomplicated: Secondary | ICD-10-CM | POA: Insufficient documentation

## 2015-06-21 DIAGNOSIS — F17210 Nicotine dependence, cigarettes, uncomplicated: Secondary | ICD-10-CM | POA: Insufficient documentation

## 2015-06-21 DIAGNOSIS — F1210 Cannabis abuse, uncomplicated: Secondary | ICD-10-CM | POA: Insufficient documentation

## 2015-06-21 DIAGNOSIS — Z8781 Personal history of (healed) traumatic fracture: Secondary | ICD-10-CM | POA: Insufficient documentation

## 2015-06-21 DIAGNOSIS — F419 Anxiety disorder, unspecified: Secondary | ICD-10-CM | POA: Insufficient documentation

## 2015-06-21 DIAGNOSIS — Z8614 Personal history of Methicillin resistant Staphylococcus aureus infection: Secondary | ICD-10-CM | POA: Insufficient documentation

## 2015-06-21 DIAGNOSIS — F1310 Sedative, hypnotic or anxiolytic abuse, uncomplicated: Secondary | ICD-10-CM

## 2015-06-21 DIAGNOSIS — M109 Gout, unspecified: Secondary | ICD-10-CM | POA: Insufficient documentation

## 2015-06-21 DIAGNOSIS — Z87828 Personal history of other (healed) physical injury and trauma: Secondary | ICD-10-CM | POA: Insufficient documentation

## 2015-06-21 DIAGNOSIS — Z79899 Other long term (current) drug therapy: Secondary | ICD-10-CM | POA: Insufficient documentation

## 2015-06-21 DIAGNOSIS — F3163 Bipolar disorder, current episode mixed, severe, without psychotic features: Secondary | ICD-10-CM

## 2015-06-21 LAB — COMPREHENSIVE METABOLIC PANEL
ALK PHOS: 63 U/L (ref 38–126)
ALT: 28 U/L (ref 17–63)
ANION GAP: 8 (ref 5–15)
AST: 35 U/L (ref 15–41)
Albumin: 4.1 g/dL (ref 3.5–5.0)
BILIRUBIN TOTAL: 0.5 mg/dL (ref 0.3–1.2)
BUN: 10 mg/dL (ref 6–20)
CALCIUM: 8.9 mg/dL (ref 8.9–10.3)
CO2: 26 mmol/L (ref 22–32)
CREATININE: 0.66 mg/dL (ref 0.61–1.24)
Chloride: 105 mmol/L (ref 101–111)
Glucose, Bld: 98 mg/dL (ref 65–99)
Potassium: 4.3 mmol/L (ref 3.5–5.1)
SODIUM: 139 mmol/L (ref 135–145)
TOTAL PROTEIN: 7.7 g/dL (ref 6.5–8.1)

## 2015-06-21 LAB — RAPID URINE DRUG SCREEN, HOSP PERFORMED
Amphetamines: NOT DETECTED
Barbiturates: NOT DETECTED
Benzodiazepines: POSITIVE — AB
COCAINE: NOT DETECTED
OPIATES: NOT DETECTED
TETRAHYDROCANNABINOL: POSITIVE — AB

## 2015-06-21 LAB — ACETAMINOPHEN LEVEL

## 2015-06-21 LAB — CBC
HCT: 43.9 % (ref 39.0–52.0)
HEMOGLOBIN: 15 g/dL (ref 13.0–17.0)
MCH: 32.1 pg (ref 26.0–34.0)
MCHC: 34.2 g/dL (ref 30.0–36.0)
MCV: 93.8 fL (ref 78.0–100.0)
Platelets: 226 10*3/uL (ref 150–400)
RBC: 4.68 MIL/uL (ref 4.22–5.81)
RDW: 15.3 % (ref 11.5–15.5)
WBC: 9.9 10*3/uL (ref 4.0–10.5)

## 2015-06-21 LAB — ETHANOL

## 2015-06-21 LAB — SALICYLATE LEVEL

## 2015-06-21 MED ORDER — IBUPROFEN 200 MG PO TABS
600.0000 mg | ORAL_TABLET | Freq: Three times a day (TID) | ORAL | Status: DC | PRN
  Administered 2015-06-21: 600 mg via ORAL
  Filled 2015-06-21: qty 3

## 2015-06-21 MED ORDER — DIVALPROEX SODIUM ER 500 MG PO TB24
750.0000 mg | ORAL_TABLET | Freq: Every day | ORAL | Status: DC
  Administered 2015-06-21: 750 mg via ORAL
  Filled 2015-06-21 (×2): qty 1

## 2015-06-21 MED ORDER — ACETAMINOPHEN 325 MG PO TABS: 650.0000 mg | ORAL_TABLET | ORAL | Status: DC | PRN

## 2015-06-21 MED ORDER — QUETIAPINE FUMARATE 25 MG PO TABS
25.0000 mg | ORAL_TABLET | Freq: Three times a day (TID) | ORAL | Status: DC
  Administered 2015-06-21: 25 mg via ORAL
  Filled 2015-06-21 (×2): qty 1

## 2015-06-21 MED ORDER — ONDANSETRON HCL 4 MG PO TABS: 4.0000 mg | ORAL_TABLET | Freq: Three times a day (TID) | ORAL | Status: DC | PRN

## 2015-06-21 MED ORDER — CLONAZEPAM 1 MG PO TABS
1.0000 mg | ORAL_TABLET | Freq: Three times a day (TID) | ORAL | Status: DC | PRN
  Administered 2015-06-21: 1 mg via ORAL
  Filled 2015-06-21: qty 1

## 2015-06-21 MED ORDER — ALUM & MAG HYDROXIDE-SIMETH 200-200-20 MG/5ML PO SUSP: 30.0000 mL | ORAL | Status: DC | PRN

## 2015-06-21 MED ORDER — QUETIAPINE FUMARATE 100 MG PO TABS
200.0000 mg | ORAL_TABLET | Freq: Every day | ORAL | Status: DC
  Administered 2015-06-21: 200 mg via ORAL
  Filled 2015-06-21: qty 2

## 2015-06-21 MED ORDER — CITALOPRAM HYDROBROMIDE 20 MG PO TABS
20.0000 mg | ORAL_TABLET | Freq: Every day | ORAL | Status: DC
  Administered 2015-06-21 – 2015-06-22 (×2): 20 mg via ORAL
  Filled 2015-06-21 (×3): qty 1
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
## 2015-06-21 NOTE — ED Notes (Signed)
Per pt, states he is depressed and was recently kicked out of program for positive for benzo's-program was in  [place]-returned home-states he is suicidal
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
## 2015-06-21 NOTE — Progress Notes (Signed)
Pt is a 30 y/[facility] related to Depression. Per report and chart pt was kicked out a drug programm Engineer, civil (consulting)([facility]) for pocketing and cheeking his Klonopin. Pt was tearful on initial contact. Expressed that he was feeling depressed and hopeless related to the incident. Pt endorsed SI with plan to jump off a bridge, verbally contracts for safety. Stated he's been cleaned for 65 days, enjoyed the programm and was looking forward to taking his GED exam next weekend. Pt reported medication noncompliant X 3 weeks, stated "I haven't been able to afford it". Pt is ambulatory with a steady gait. Support, availability and encouragement offered. Safety maintained on Q 15 minutes checks as ordered without self injurious behavior to report at this time.

## 2015-06-21 NOTE — ED Notes (Signed)
Bed: [facility]41 Expected date:  Expected time:  Means of arrival:  Comments: Hold for triage 3 

## 2015-06-21 NOTE — BH Assessment (Signed)
Assessment completed. Consulted Hulan FessIjeoma Nwaeze, NP who recommended inpatient treatment. TTS to seek placement. Informed Dr. Rubin PayorPickering of the recommendation.

## 2015-06-22 ENCOUNTER — Encounter (HOSPITAL_COMMUNITY): Payer: Self-pay | Admitting: *Deleted

## 2015-06-22 ENCOUNTER — Inpatient Hospital Stay (HOSPITAL_COMMUNITY)
Admission: AD | Admit: 2015-06-22 | Discharge: 2015-06-27 | DRG: 885 | Disposition: A | Discharge status: Home / Self Care | Payer: Federal, State, Local not specified - Other | Source: Intra-hospital | Attending: Psychiatry | Admitting: Psychiatry

## 2015-06-22 DIAGNOSIS — F3163 Bipolar disorder, current episode mixed, severe, without psychotic features: Principal | ICD-10-CM | POA: Diagnosis present

## 2015-06-22 DIAGNOSIS — E221 Hyperprolactinemia: Secondary | ICD-10-CM | POA: Diagnosis present

## 2015-06-22 DIAGNOSIS — F1121 Opioid dependence, in remission: Secondary | ICD-10-CM | POA: Clinically undetermined

## 2015-06-22 DIAGNOSIS — F1490 Cocaine use, unspecified, uncomplicated: Secondary | ICD-10-CM | POA: Diagnosis not present

## 2015-06-22 DIAGNOSIS — F1220 Cannabis dependence, uncomplicated: Secondary | ICD-10-CM | POA: Diagnosis present

## 2015-06-22 DIAGNOSIS — F1310 Sedative, hypnotic or anxiolytic abuse, uncomplicated: Secondary | ICD-10-CM | POA: Diagnosis not present

## 2015-06-22 DIAGNOSIS — R45851 Suicidal ideations: Secondary | ICD-10-CM | POA: Diagnosis not present

## 2015-06-22 DIAGNOSIS — F17210 Nicotine dependence, cigarettes, uncomplicated: Secondary | ICD-10-CM | POA: Diagnosis present

## 2015-06-22 DIAGNOSIS — F1421 Cocaine dependence, in remission: Secondary | ICD-10-CM | POA: Clinically undetermined

## 2015-06-22 DIAGNOSIS — F4310 Post-traumatic stress disorder, unspecified: Secondary | ICD-10-CM | POA: Diagnosis present

## 2015-06-22 DIAGNOSIS — F329 Major depressive disorder, single episode, unspecified: Secondary | ICD-10-CM | POA: Diagnosis present

## 2015-06-22 DIAGNOSIS — F1390 Sedative, hypnotic, or anxiolytic use, unspecified, uncomplicated: Secondary | ICD-10-CM | POA: Diagnosis not present

## 2015-06-22 DIAGNOSIS — F3131 Bipolar disorder, current episode depressed, mild: Secondary | ICD-10-CM | POA: Diagnosis present

## 2015-06-22 MED ORDER — QUETIAPINE FUMARATE 200 MG PO TABS
200.0000 mg | ORAL_TABLET | Freq: Every day | ORAL | Status: DC
  Administered 2015-06-22 – 2015-06-26 (×5): 200 mg via ORAL
  Filled 2015-06-22 (×8): qty 1

## 2015-06-22 MED ORDER — HYDROXYZINE HCL 50 MG PO TABS
50.0000 mg | ORAL_TABLET | Freq: Three times a day (TID) | ORAL | Status: DC | PRN
  Administered 2015-06-22: 50 mg via ORAL
  Filled 2015-06-22: qty 1

## 2015-06-22 MED ORDER — MAGNESIUM HYDROXIDE 400 MG/5ML PO SUSP: 30.0000 mL | Freq: Every day | ORAL | Status: DC | PRN

## 2015-06-22 MED ORDER — DIVALPROEX SODIUM ER 500 MG PO TB24
750.0000 mg | ORAL_TABLET | Freq: Every day | ORAL | Status: DC
  Administered 2015-06-22 – 2015-06-26 (×5): 750 mg via ORAL
  Filled 2015-06-22 (×8): qty 1

## 2015-06-22 MED ORDER — QUETIAPINE FUMARATE 25 MG PO TABS
25.0000 mg | ORAL_TABLET | Freq: Three times a day (TID) | ORAL | Status: DC
  Administered 2015-06-23 – 2015-06-24 (×4): 25 mg via ORAL
  Filled 2015-06-22 (×8): qty 1

## 2015-06-22 MED ORDER — ACETAMINOPHEN 325 MG PO TABS
650.0000 mg | ORAL_TABLET | Freq: Four times a day (QID) | ORAL | Status: DC | PRN
  Administered 2015-06-26: 650 mg via ORAL
  Filled 2015-06-22: qty 2

## 2015-06-22 MED ORDER — HYDROXYZINE HCL 25 MG PO TABS
50.0000 mg | ORAL_TABLET | Freq: Three times a day (TID) | ORAL | Status: DC | PRN
  Filled 2015-06-22: qty 2

## 2015-06-22 MED ORDER — ALUM & MAG HYDROXIDE-SIMETH 200-200-20 MG/5ML PO SUSP: 30.0000 mL | ORAL | Status: DC | PRN

## 2015-06-22 MED ORDER — CLONAZEPAM 0.5 MG PO TABS
0.5000 mg | ORAL_TABLET | Freq: Once | ORAL | Status: AC
  Administered 2015-06-22: 0.5 mg via ORAL
  Filled 2015-06-22: qty 1

## 2015-06-22 MED ORDER — NICOTINE 21 MG/24HR TD PT24
21.0000 mg | MEDICATED_PATCH | Freq: Every day | TRANSDERMAL | Status: DC
  Administered 2015-06-22 – 2015-06-27 (×6): 21 mg via TRANSDERMAL
  Filled 2015-06-22 (×9): qty 1

## 2015-06-22 MED ORDER — CITALOPRAM HYDROBROMIDE 20 MG PO TABS
20.0000 mg | ORAL_TABLET | Freq: Every day | ORAL | Status: DC
  Administered 2015-06-23 – 2015-06-27 (×5): 20 mg via ORAL
  Filled 2015-06-22 (×6): qty 1
  Filled 2015-06-22: qty 7
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
## 2015-06-22 NOTE — Progress Notes (Addendum)
D: Pt denies SI/HI/AVH. Pt is pleasant and cooperative. Pt stated he was feeling very anxious today. Pt stated he did not have any way to refill his medications and the Extended release Depakote was too expensive. Pt stated he was [facility] the rescue [facility], so he wants to get back to working on his GED and back to working and getting and staying on his medications. Pt stated he would like to try something for his anxiety , because the Vistaril makes him sleepy and he wanted something that would not make him sleepy during the day , which was the reason he said he was sleep all day today.   A: Pt was offered support and encouragement. Pt was given scheduled medications. Pt was encourage to attend groups. Q 15 minute checks were done for safety.   R:Pt attends groups and interacts well with peers and staff. Pt is taking medication. Pt has no complaints at this time .Pt receptive to treatment and safety maintained on unit.

## 2015-06-22 NOTE — ED Notes (Signed)
Patient has declined ordered seroquel and states "vistaril does not work".  Requesting only klonopin.  Report to NP/MD.  Fred RuizJohn approached this writer and then states he will take vistaril. Order acknowledged from MD.

## 2015-06-22 NOTE — Progress Notes (Signed)
Patient alert and oriented x 4. Patient denies HI/AVH. Patient reporting passive SI to jump off bridge stating, "I feel like I just don't need to be in the world anymore." Patient complained of pain 7/10 in his mouth from abscessed tooth.  Patient at that time also complained of increased anxiety. PRN dose of Klonopin 1 mg and 600 mg Ibuprofen given PO. On reassessment patient states Klonopin helped some but Ibuprofen did not pain was still 7/10. This Clinical research associatewriter offered patient tylenol patient refused. Will continue to monitor.

## 2015-06-22 NOTE — Plan of Care (Signed)
Problem: Alteration in mood & ability to function due to Goal: LTG-Pt reports reduction in suicidal thoughts (Patient reports reduction in suicidal thoughts and is able to verbalize a safety plan for whenever patient is feeling suicidal)  Outcome: Progressing Pt denies SI at this time     
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
## 2015-06-22 NOTE — Clinical Social Work Note (Signed)
CSW met with pt to provide voluntary consent for treatment paperwork for admit into [facility].  Pt also signed consents for his mother and father.  CSW faxed over paperwork to [facility] and provided RN with originals.  Dede Query, LCSW [place] Worker - Weekend Coverage cell #: [phone]

## 2015-06-22 NOTE — Progress Notes (Signed)
Adult Psychoeducational Group Note  Date:  06/22/2015 Time:  8:49 PM  Group Topic/Focus:  Wrap-Up Group:   The focus of this group is to help patients review their daily goal of treatment and discuss progress on daily workbooks.  Participation Level:  Active  Participation Quality:  Appropriate and Attentive  Affect:  Appropriate  Cognitive:  Appropriate  Insight: Appropriate  Engagement in Group:  Engaged  Modes of Intervention:  Discussion  Additional Comments:  Pt stated he had a rough day, and slept most of it. Pt stated he wants to work on controlling his anger and learning to please God more, and please God first.  Caswell CorwinOwen, Isael Stille C 06/22/2015, 8:49 PM

## 2015-06-23 ENCOUNTER — Encounter (HOSPITAL_COMMUNITY): Payer: Self-pay | Admitting: Psychiatry

## 2015-06-23 DIAGNOSIS — F1121 Opioid dependence, in remission: Secondary | ICD-10-CM | POA: Clinically undetermined

## 2015-06-23 DIAGNOSIS — F3163 Bipolar disorder, current episode mixed, severe, without psychotic features: Principal | ICD-10-CM

## 2015-06-23 DIAGNOSIS — F1220 Cannabis dependence, uncomplicated: Secondary | ICD-10-CM

## 2015-06-23 DIAGNOSIS — F1490 Cocaine use, unspecified, uncomplicated: Secondary | ICD-10-CM

## 2015-06-23 DIAGNOSIS — E221 Hyperprolactinemia: Secondary | ICD-10-CM | POA: Clinically undetermined

## 2015-06-23 DIAGNOSIS — F1390 Sedative, hypnotic, or anxiolytic use, unspecified, uncomplicated: Secondary | ICD-10-CM

## 2015-06-23 DIAGNOSIS — R45851 Suicidal ideations: Secondary | ICD-10-CM

## 2015-06-23 DIAGNOSIS — F1421 Cocaine dependence, in remission: Secondary | ICD-10-CM | POA: Clinically undetermined

## 2015-06-23 DIAGNOSIS — F4310 Post-traumatic stress disorder, unspecified: Secondary | ICD-10-CM

## 2015-06-23 DIAGNOSIS — F1190 Opioid use, unspecified, uncomplicated: Secondary | ICD-10-CM

## 2015-06-23 MED ORDER — ONDANSETRON 4 MG PO TBDP: 4.0000 mg | ORAL_TABLET | Freq: Four times a day (QID) | ORAL | Status: AC | PRN

## 2015-06-23 MED ORDER — CHLORDIAZEPOXIDE HCL 25 MG PO CAPS
25.0000 mg | ORAL_CAPSULE | Freq: Once | ORAL | Status: AC
  Administered 2015-06-23: 25 mg via ORAL
  Filled 2015-06-23: qty 1

## 2015-06-23 MED ORDER — LOPERAMIDE HCL 2 MG PO CAPS: 2.0000 mg | ORAL_CAPSULE | ORAL | Status: AC | PRN

## 2015-06-23 MED ORDER — BUSPIRONE HCL 5 MG PO TABS
5.0000 mg | ORAL_TABLET | Freq: Two times a day (BID) | ORAL | Status: DC
  Administered 2015-06-23 – 2015-06-24 (×2): 5 mg via ORAL
  Filled 2015-06-23 (×4): qty 1

## 2015-06-23 MED ORDER — THIAMINE HCL 100 MG/ML IJ SOLN
100.0000 mg | Freq: Once | INTRAMUSCULAR | Status: AC
  Administered 2015-06-23: 100 mg via INTRAMUSCULAR
  Filled 2015-06-23: qty 2

## 2015-06-23 MED ORDER — VITAMIN B-1 100 MG PO TABS
100.0000 mg | ORAL_TABLET | Freq: Every day | ORAL | Status: DC
  Administered 2015-06-24 – 2015-06-27 (×4): 100 mg via ORAL
  Filled 2015-06-23 (×6): qty 1

## 2015-06-23 MED ORDER — ADULT MULTIVITAMIN W/MINERALS CH
1.0000 | ORAL_TABLET | Freq: Every day | ORAL | Status: DC
  Administered 2015-06-23 – 2015-06-27 (×5): 1 via ORAL
  Filled 2015-06-23 (×8): qty 1

## 2015-06-23 MED ORDER — CHLORDIAZEPOXIDE HCL 25 MG PO CAPS
25.0000 mg | ORAL_CAPSULE | Freq: Every day | ORAL | Status: AC
  Administered 2015-06-27: 25 mg via ORAL
  Filled 2015-06-23: qty 1

## 2015-06-23 MED ORDER — CHLORDIAZEPOXIDE HCL 25 MG PO CAPS
25.0000 mg | ORAL_CAPSULE | ORAL | Status: AC
  Administered 2015-06-25 – 2015-06-26 (×2): 25 mg via ORAL
  Filled 2015-06-23 (×2): qty 1

## 2015-06-23 MED ORDER — CHLORDIAZEPOXIDE HCL 25 MG PO CAPS
25.0000 mg | ORAL_CAPSULE | Freq: Four times a day (QID) | ORAL | Status: AC
  Administered 2015-06-23 – 2015-06-24 (×3): 25 mg via ORAL
  Filled 2015-06-23 (×3): qty 1

## 2015-06-23 MED ORDER — HYDROXYZINE HCL 25 MG PO TABS
25.0000 mg | ORAL_TABLET | Freq: Four times a day (QID) | ORAL | Status: AC | PRN
  Administered 2015-06-24 – 2015-06-26 (×4): 25 mg via ORAL
  Filled 2015-06-23 (×4): qty 1

## 2015-06-23 MED ORDER — CHLORDIAZEPOXIDE HCL 25 MG PO CAPS: 25.0000 mg | ORAL_CAPSULE | Freq: Four times a day (QID) | ORAL | Status: DC | PRN

## 2015-06-23 MED ORDER — CHLORDIAZEPOXIDE HCL 25 MG PO CAPS
25.0000 mg | ORAL_CAPSULE | Freq: Three times a day (TID) | ORAL | Status: AC
  Administered 2015-06-24 – 2015-06-25 (×3): 25 mg via ORAL
  Filled 2015-06-23 (×3): qty 1
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
## 2015-06-23 NOTE — [facility] Group Notes (Signed)
[facility] Group Notes:  (Nursing/MHT/Case Management/Adjunct)  Date:  06/23/2015  Time:  1400  Type of Therapy:  Nurse Education - Healthy Support Systems  Participation Level:  Active  Participation Quality:  Appropriate  Affect:  Appropriate  Cognitive:  Alert  Insight:  Improving  Engagement in Group:  Engaged  Modes of Intervention:  Education  Summary of Progress/Problems: Patient attended and participated appropriately in nursing education group.   Merian CapronFriedman, Bayani Renteria [facility] LL[facility] 06/23/2015, 3:00 PM
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
## 2015-06-23 NOTE — [facility] Suicide Risk Assessment (Signed)
[facility] Admission Suicide Risk Assessment   Nursing information obtained from:    Demographic factors:    Current Mental Status:    Loss Factors:    Historical Factors:    Risk Reduction Factors:    Total Time spent with patient: 30 minutes Principal Problem: Bipolar disorder, curr episode mixed, severe, w/o psychotic features (HCC) Diagnosis:   Patient Active Problem List   Diagnosis Date Noted  . 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 Opioid use disorder, moderate, in sustained remission [F11.90] 06/23/2015  . Cocaine use disorder, moderate, in sustained remission [F14.90] 06/23/2015  . Bipolar disorder, curr episode mixed, severe, w/o psychotic features (HCC) [F31.63] 04/20/2015  . PTSD (post-traumatic stress disorder) [F43.10] 04/20/2015  . Cannabis use disorder, severe, dependence (HCC) [F12.20] 04/20/2015  . Tobacco use disorder [F17.200] 04/20/2015     Continued Clinical Symptoms:  Alcohol Use Disorder Identification Test Final Score (AUDIT): 0 The "Alcohol Use Disorders Identification Test", Guidelines for Use in Primary Care, Second Edition.  World Science writer [facility]). Score between 0-7:  no or low risk or alcohol related problems. Score between 8-15:  moderate risk of alcohol related problems. Score between 16-19:  high risk of alcohol related problems. Score 20 or above:  warrants further diagnostic evaluation for alcohol dependence and treatment.   CLINICAL FACTORS:   Alcohol/Substance Abuse/Dependencies Previous Psychiatric Diagnoses and Treatments   Musculoskeletal: Strength & Muscle Tone: within normal limits Gait & Station: normal Patient leans: N/A  Psychiatric Specialty Exam: Physical Exam  Review of Systems  Psychiatric/Behavioral: Positive for depression, suicidal ideas and substance abuse. The patient is nervous/anxious.   All other systems reviewed and are negative.   Blood pressure 116/77, pulse 71, temperature 97.7 F  (36.5 C), temperature source Oral, resp. rate 18, height 5\' 4"  (1.626 m), weight 81.647 kg (180 lb), SpO2 98 %.Body mass index is 30.88 kg/(m^2).  General Appearance: Casual  Eye Contact::  Fair  Speech:  Clear and Coherent  Volume:  Normal  Mood:  Anxious and Depressed  Affect:  Depressed  Thought Process:  Coherent  Orientation:  Full (Time, Place, and Person)  Thought Content:  Rumination  Suicidal Thoughts:  Yes.  with intent/plan  Homicidal Thoughts:  No  Memory:  Immediate;   Fair Recent;   Fair Remote;   Fair  Judgement:  Impaired  Insight:  Shallow  Psychomotor Activity:  Restlessness  Concentration:  Poor  Recall:  [employer] of Knowledge:Fair  Language: Fair  Akathisia:  No  Handed:  Right  AIMS (if indicated):     Assets:  Desire for Improvement  Sleep:  Number of Hours: 5.75  Cognition: WNL  ADL's:  Intact     COGNITIVE FEATURES THAT CONTRIBUTE TO RISK:  Closed-mindedness, Polarized thinking and Thought constriction (tunnel vision)    SUICIDE RISK:   Moderate:  Frequent suicidal ideation with limited intensity, and duration, some specificity in terms of plans, no associated intent, good self-control, limited dysphoria/symptomatology, some risk factors present, and identifiable protective factors, including available and accessible social support.  PLAN OF CARE: Patient with hx of mood lability , anxiety as well as substance abuse ( BZD, opioids in remission, cannabis , cocaine in remission) , who presented with worsening mood sx as well as SI. Pt is very focussed on getting back on klonopin. However , discussed risk of being on BZD for long term and also discussed his past and current hx of substance abuse , and that it is beneficial at  this time to find alternative treatment for anxiety , rather than being on Klonopin. Patient will benefit from inpatient treatment and stabilization.  Estimated length of stay is 5-7 days.  Will start CIWA/Librium protocol for BZD  abuse. Will add Buspar 5 mg po bid for anxiety sx. Will restart home medications - please also see H&p. Reviewed past medical records,treatment plan.  Will continue to monitor vitals ,medication compliance and treatment side effects while patient is here.  Will monitor for medical issues as well as call consult as needed.  Reviewed labs , UDS - pos for BZD, THC, PL - elevated - could monitor on an out pt basis. CSW will start working on disposition.  Patient to participate in therapeutic milieu .       Medical Decision Making:  Review of Psycho-Social Stressors (1), Review or order clinical lab tests (1), Discuss test with performing physician (1), Decision to obtain old records (1), Review and summation of old records (2), Established Problem, Worsening (2), Review of Last Therapy Session (1) and Review of New Medication or Change in Dosage (2)  I certify that inpatient services furnished can reasonably be expected to improve the patient's condition.   Darrien Laakso MD 06/23/2015, 11:01 AM

## 2015-06-23 NOTE — Progress Notes (Signed)
Assumed care of patient at 2330.  Pt is currently resting in room with eyes closed.  Respirations are even and unlabored.  Pt does not appear to be in any distress.  Will continue to monitor and assess.  
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
## 2015-06-23 NOTE — [facility] Group Notes (Signed)
[facility] Group Notes: (Clinical Social Work)   06/23/2015      Type of Therapy:  Group Therapy   Participation Level:  Did Not Attend despite MHT prompting   Ilina Xu Grossman-Orr, LCSW 06/23/2015, 1:11 PM     

## 2015-06-23 NOTE — Progress Notes (Signed)
D- Patient observed in the dayroom interacting well with peers.  Patient has c/o anxiety due to withdrawal symptoms.  Patient given medication, per MD order, to help with withdrawal symptoms.  Denies SI/HI/AVH. No other complaints.   A- Scheduled medications administered to patient, per MD orders. Support and encouragement provided.  Routine safety checks conducted every 15 minutes.  Patient informed to notify staff with problems or concerns. R- No adverse drug reactions noted. Patient contracts for safety at this time. Patient compliant with medications and treatment plan. Patient receptive, calm, and cooperative.  Patient remains safe at this time.

## 2015-06-23 NOTE — Progress Notes (Signed)
Patient did attend the evening speaker AA meeting.  

## 2015-06-24 MED ORDER — CLONAZEPAM 1 MG PO TABS
1.0000 mg | ORAL_TABLET | Freq: Two times a day (BID) | ORAL | Status: DC | PRN
  Administered 2015-06-24 – 2015-06-27 (×6): 1 mg via ORAL
  Filled 2015-06-24 (×7): qty 1

## 2015-06-24 MED ORDER — QUETIAPINE FUMARATE 25 MG PO TABS
25.0000 mg | ORAL_TABLET | Freq: Three times a day (TID) | ORAL | Status: DC | PRN
  Administered 2015-06-25 – 2015-06-27 (×6): 25 mg via ORAL
  Filled 2015-06-24 (×6): qty 1

## 2015-06-24 NOTE — Tx Team (Signed)
Initial Interdisciplinary Treatment Plan   PATIENT STRESSORS: Financial difficulties Medication change or noncompliance Occupational concerns Substance abuse   PATIENT STRENGTHS: Ability for insight Active sense of humor Average or above average intelligence Capable of independent living Communication skills General fund of knowledge Motivation for treatment/growth Physical Health Religious Affiliation Supportive family/friends   PROBLEM LIST: Problem List/Patient Goals Date to be addressed Date deferred Reason deferred Estimated date of resolution  "depression" 06/24/2015   D/c  "anxiety" 06/24/2015   D/c  "substance abuse" 06/24/2015   D/c  "suicidal thoughts": 06/24/2015   D/c                                 DISCHARGE CRITERIA:  Ability to meet basic life and health needs Adequate post-discharge living arrangements Improved stabilization in mood, thinking, and/or behavior Medical problems require only outpatient monitoring Motivation to continue treatment in a less acute level of care Need for constant or close observation no longer present Reduction of life-threatening or endangering symptoms to within safe limits Safe-care adequate arrangements made Verbal commitment to aftercare and medication compliance Withdrawal symptoms are absent or subacute and managed without 24-hour nursing intervention  PRELIMINARY DISCHARGE PLAN: Attend aftercare/continuing care group Attend PHP/IOP Attend 12-step recovery group Outpatient therapy Return to previous living arrangement  PATIENT/FAMIILY INVOLVEMENT: This treatment plan has been presented to and reviewed with the patient, Fred GlaserJohn Viswanathan Jr."depress.  The patient and family have been given the opportunity to ask questions and make suggestions.  Fred Reynolds, Fred Reynolds 06/24/2015, 6:26 PM

## 2015-06-24 NOTE — [facility] Group Notes (Signed)
[facility] Aftercare Discharge Planning Group Note  06/24/2015 8:45 AM  Participation Quality: Alert, Appropriate and Oriented  Mood/Affect: Appropriate  Depression Rating: 7  Anxiety Rating: 5  Thoughts of Suicide: Pt endorses SI this morning, but reports that it has "gone down"  Will you contract for safety? Yes  Current AVH: Pt denies  Plan for Discharge/Comments: Pt attended discharge planning group and actively participated in group. CSW discussed suicide prevention education with the group and encouraged them to discuss discharge planning and any relevant barriers. Pt expressed that his anxiety medicine was helping him feel calm and reports a discharge plan of returning to live with his parents.   Transportation Means: Pt reports access to transportation  Supports: No supports mentioned at this time  Chad CordialLauren Carter, LCSWA 06/24/2015 11:46 AM

## 2015-06-24 NOTE — Tx Team (Signed)
Interdisciplinary Treatment Plan Update (Adult)  Date:  06/24/2015  Time Reviewed:  8:30 AM   Progress in Treatment: Attending groups: Yes-pt is programming on [address].  Participating in groups:  Yes.  Taking medication as prescribed:  Yes. Tolerating medication:  Yes. Family/Significant othe contact made:  SPE required for this pt.  Patient understands diagnosis:  Yes. and As evidenced by:  seeking treatment for SI with plan, increased depression, marijuana abuse, and medication stabilization. Discussing patient identified problems/goals with staff:  Yes. Medical problems stabilized or resolved:  Yes. Denies suicidal/homicidal ideation: No.Passive SI/Able to contract for safety.  Issues/concerns per patient self-inventory:  Other:  Discharge Plan or Barriers: CSW assessing for appropriate referrals.   Reason for Continuation of Hospitalization: Depression Medication stabilization Suicidal ideation  Comments:  Fred Reynolds. is an 30 y.o. male presenting to [facility] reporting suicidal ideations with a plan to shoot self or jump from a bridge. Pt stated "I am having suicidal thoughts and depression". "I am sick of being on this earth". "The past 2-3 days everything that was going good for me was taken away on yesterday". Pt reported that he was clean for 65 days. Pt reported that he was recently kicked out of a program at the [facility] where he was involved with the victory program, working and completing GED. Pt reported that he was kicked out of the program because he slammed the door after the staff accused him of hoarding his medication.  Pt reported suicidal ideations with a plan to shoot self or jump from a bridge. Pt did not report any previous suicide attempts or self -injurious behaviors. Pt denied HI and AVH at this time. Pt reported that he is currently receiving mental health treatment and shared that he has been hospitalized in the past. Pt reported that he is  dealing with multiple stressors and is reporting several depressive symptoms. Pt reported that he has abused heroin, cocaine and THC in the past and used THC on 06-20-15. Pt did not report any physical, sexual or emotional abuse at this time. Diagnosis: Bipolar Disorder, Cannabis Use Disorder, Opioid Use Disorder, Cocaine Use Disorder  Estimated length of stay:  3-5 days   New goal(s): to develop effective aftercare plan.   Additional Comments:  Patient and CSW reviewed pt's identified goals and treatment plan. Patient verbalized understanding and agreed to treatment plan. CSW reviewed [facility] "Discharge Process and Patient Involvement" Form. Pt verbalized understanding of information provided and signed form.    Review of initial/current patient goals per problem list:  1. Goal(s): Patient will participate in aftercare plan  Met: Yes  Target date: at discharge  As evidenced by: Patient will participate within aftercare plan AEB aftercare provider and housing plan at discharge being identified.  1/16: Pt plans to stay with his parents at d/c and plans to follow-up at [facility] for counseling.   2. Goal (s): Patient will exhibit decreased depressive symptoms and suicidal ideations.  Met: No.    Target date: at discharge  As evidenced by: Patient will utilize self rating of depression at 3 or below and demonstrate decreased signs of depression or be deemed stable for discharge by MD.  1/16: Pt rates depression as high and is endorsing passive SI/able to contract for safety on the unit.   3. Goal(s): Patient will demonstrate decreased signs of withdrawal due to substance abuse  Met: Yes   Target date:at discharge   As evidenced by: Patient will produce a  CIWA/COWS score of 0, have stable vitals signs, and no symptoms of withdrawal.  1/16: Pt reports no signs of withdrawal with CIWA/COWS of 0 and stable vitals.   Attendees: Patient:   06/24/2015 8:30 AM    Family:   06/24/2015 8:30 AM   Physician:  Dr. Carlton Adam, MD 06/24/2015 8:30 AM   Nursing:   Everlean Cherry RN 06/24/2015 8:30 AM   Clinical Social Worker: Maxie Better, LCSW 06/24/2015 8:30 AM   Clinical Social Worker: Erasmo Downer Drinkard LCSWA; Peri Maris LCSWA 06/24/2015 8:30 AM   Other:  06/24/2015 8:30 AM   Other:  Huel Cote NP 06/24/2015 8:30 AM   Other:   06/24/2015 8:30 AM   Other:  06/24/2015 8:30 AM   Other:  06/24/2015 8:30 AM   Other:  06/24/2015 8:30 AM    06/24/2015 8:30 AM    06/24/2015 8:30 AM    06/24/2015 8:30 AM    06/24/2015 8:30 AM    Scribe for Treatment Team:   Maxie Better, LCSW 06/24/2015 8:30 AM

## 2015-06-24 NOTE — Progress Notes (Signed)
Recreation Therapy Notes  Date: 01.16.2017 Time: 9:30am Location: [address] Group Room  Group Topic: Stress Management  Goal Area(s) Addresses:  Patient will actively participate in stress management techniques presented during session.   Behavioral Response: Did not attend.   Adarrius Graeff L Ruthella Kirchman, LRT/CTRS        Dylon Correa L 06/24/2015 11:52 AM 
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
## 2015-06-24 NOTE — Plan of Care (Signed)
Problem: Consults Goal: Anxiety Disorder Patient Education See Patient Education Module for eduction specifics.  Outcome: Progressing Nurse discussed anxiety/depression/coping skills with patient.        

## 2015-06-24 NOTE — Progress Notes (Addendum)
D:  Patient's self inventory sheet, patient has fair sleep, sleep medication is helpful.  Good appetite, normal energy level, poor concentration.  Rated depression and hopeless 7, anxiety 10.  Withdrawals, agitation, irritable.  SI, off/on, contracts for safety.  Denied physical problems.  Goal is to work on new meds, see how I react to meds, talk to MD about medication affects.  Will try to find a primary MD.  Does have discharge plan.  Will try to find a primary MD to stay current with medications. A:  Medications administered per MD orders.  Emotional support and encouragement given patient. R:  Denied SI and HI while talking to nurse.  On self inventory sheet, patient stated he is SI sometimes.  Denied A/V hallucinations while talking to RN.  Safety maintained with 15 minute checks.

## 2015-06-24 NOTE — Progress Notes (Signed)
Adult Psychoeducational Group Note  Date:  06/24/2015 Time:  9:07 PM  Group Topic/Focus:  Wrap-Up Group:   The focus of this group is to help patients review their daily goal of treatment and discuss progress on daily workbooks.  Participation Level:  Active  Participation Quality:  Appropriate and Attentive  Affect:  Appropriate  Cognitive:  Appropriate  Insight: Appropriate and Good  Engagement in Group:  Engaged  Modes of Intervention:  Discussion  Additional Comments:  Pt stated he had a great day. He has had no SI/HI. His goal was get his medications straight which he did and to get his orange card paperwork, and he is still working on discharge.  Caswell CorwinOwen, Kailyn Vanderslice C 06/24/2015, 9:07 PM

## 2015-06-25 NOTE — Plan of Care (Signed)
Problem: Consults Goal: Depression Patient Education See Patient Education Module for education specifics.  Outcome: Progressing Nurse discussed depression/anxiety/coping skills with patient.        

## 2015-06-25 NOTE — Progress Notes (Signed)
D:Patient in the dayroom on approach.  Patient states he had a good day until he got upset with one of the techs on the hallway.  Patient states his goal for today was to find a primary care doctor and to fill out the information for his orange card for insurance.  Patient denies SI/HI and denies AVH. A: Staff to monitor Q 15 mins for safety.  Encouragement and support offered.  Scheduled medications administered per orders.  Vistaril administered prn.  R: Patient remains safe on the unit.  Patient attended group tonight.  Patient visible on the unit and interacting with peers. Patient taking administered medications.

## 2015-06-25 NOTE — Progress Notes (Signed)
Recreation Therapy Notes  Animal-Assisted Activity (AAA) Program Checklist/Progress Notes Patient Eligibility Criteria Checklist & Daily Group note for Rec Tx Intervention  Date: 01.17.2017 Time: 2:45pm Location: [address]    AAA/T Program Assumption of Risk Form signed by Patient/ or Parent Legal Guardian yes  Patient is free of allergies or sever asthma yes  Patient reports no fear of animals yes  Patient reports no history of cruelty to animals yes  Patient understands his/her participation is voluntary yes  Behavioral Response: Did not attend   [employer], LRT/CTRS  Malin Cervini L 06/25/2015 2:54 PM

## 2015-06-25 NOTE — Progress Notes (Signed)
D:  Patient's self inventory sheet, patient has fair sleep, sleep medication is helpful.  Good appetite, normal energy level, poor concentration.  Rated depression and hopeless 7, anxiety 8. Denied withdrawals.  Denied SI.  Denied physical problems.  Physical pain, worst pain in past 24 hours is #9, teeth.  Declined any pain medication.  Goal is insurance and follow up with MD.  Plans to make more phone calls.  Will discuss goals with MD/SW.  No discharge plans.  "Med not right and no follow up with doctor." A:  Medications administered per MD orders.  Emotional support and encouragement given patient. R:  Denied SI and HI, contracts for safety.  Denied A/V hallucinations.  Safety maintained with 15 minute checks.

## 2015-06-25 NOTE — [facility] Group Notes (Signed)
Pt stated he had a pretty good morning and lunch.  He stated he got angry after group with staff who "tried to bad talk him".  His goal was to get orange card paper signed to get insurance and talk to doctor about medications.  He stated he is "discharging in 2 days or so".   Caroll Rancher, MHT
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
## 2015-06-25 NOTE — Progress Notes (Signed)
D: Pt has labile affect and mood.  He is loud and intrusive with peers.  He reports his day has "been pretty good" and that his goal was to "get information about insurance, I got the orange card application, got it filled out, talked to Dr. Lugo."  Pt denies SI/HI, denies hallucinations, denies pain.  Pt attended evening group.  2 white circular tabs were found in pt's room during environmentals.  Tabs have "40" inscribed on one side and "MYLAN 216" on the other side.  The tabs were under pt's mattress by the head of the bed.  On-site provider, AC, and charge nurse notified.   A: Introduced self to pt.  Met with pt 1:1 and offered support and encouragement.  Actively listened to pt.  Informed pt that tablets were found in his room during environmentals and that mouth checks would be performed during each medication pass.  Pt verbalized understanding.  Medications administered per order.  PRN medication administered for anxiety.  Redirected pt as needed.   R: Pt is compliant with medications.  Pt is difficult to redirect at times.  Pt verbally contracts for safety.  Will continue to monitor and assess.   

## 2015-06-25 NOTE — [facility] Group Notes (Signed)
The focus of this group is to educate the patient on the purpose and policies of crisis stabilization and provide a format to answer questions about their admission.  The group details unit policies and expectations of patients while admitted.  Patient attended group on 400 hall this morning.

## 2015-06-26 MED ORDER — ALBUTEROL SULFATE HFA 108 (90 BASE) MCG/ACT IN AERS
INHALATION_SPRAY | RESPIRATORY_TRACT | Status: AC
  Administered 2015-06-26: 2
  Filled 2015-06-26: qty 6.7

## 2015-06-26 MED ORDER — ALBUTEROL SULFATE HFA 108 (90 BASE) MCG/ACT IN AERS: 2.0000 | INHALATION_SPRAY | Freq: Four times a day (QID) | RESPIRATORY_TRACT | Status: DC | PRN

## 2015-06-26 NOTE — [facility] Group Notes (Signed)
[facility] LCSW Group Therapy 06/26/2015 1:15 PM  Type of Therapy: Group Therapy- Feelings about Diagnosis  Participation Level: Active   Participation Quality:  Attention-seeking and Intrusive  Affect:  Appropriate  Cognitive: Alert and Oriented   Insight:  Developing   Engagement in Therapy: Developing/Improving and Engaged   Modes of Intervention: Clarification, Confrontation, Discussion, Education, Exploration, Limit-setting, Orientation, Problem-solving, Rapport Building, Dance movement psychotherapist, Socialization and Support  Description of Group:   This group will allow patients to explore their thoughts and feelings about diagnoses they have received. Patients will be guided to explore their level of understanding and acceptance of these diagnoses. Facilitator will encourage patients to process their thoughts and feelings about the reactions of others to their diagnosis, and will guide patients in identifying ways to discuss their diagnosis with significant others in their lives. This group will be process-oriented, with patients participating in exploration of their own experiences as well as giving and receiving support and challenge from other group members.  Summary of Progress/Problems:  Pt participates actively in group, however his participation is often intrusive and attention seeking as he continues to tell the same anecdotes each group. Pt is tearful at time as he discusses how his behaviors have "burned bridges" with his support system. He also described the difficulty finding good support in the community.  Therapeutic Modalities:   Cognitive Behavioral Therapy Solution Focused Therapy Motivational Interviewing Relapse Prevention Therapy  Chad Cordial, LCSWA 06/25/15 03:09PM
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
## 2015-06-26 NOTE — Progress Notes (Signed)
D: Patient in the hallway on approach.  Patient has been intrusive tonight and attention seeking.  Patient has been argumentative with staff about coffee.  Patient has been trying to split staff by asking on staff member to get him coffee when she told him no because coffee time is over he asked another.  Patient states he agitated because he cannot have coffee when he wants but writer explained to patient the coffee times.  Patient states he is passive SI but verbally contracts for safety.  Patient denies HI and denies AVH. A: Staff to monitor Q 15 mins for safety.  Encouragement and support offered.  Scheduled medications administered per orders. R: Patient remains safe on the unit.  Patient attended group tonight. Patient visible on the unit and interacting with peers.  Patient taking administered medications.

## 2015-06-26 NOTE — Progress Notes (Signed)
Recreation Therapy Notes  Date: 01.18.2017 Time: 9:30am Location: [address] Dayroom   Group Topic: Stress Management  Goal Area(s) Addresses:  Patient will actively participate in stress management techniques presented during session.   Behavioral Response: Did not attend.    Koty Anctil L Michail Boyte, LRT/CTRS        Sham Alviar L 06/26/2015 11:52 AM 

## 2015-06-26 NOTE — BHH Group Notes (Signed)
[facility] LCSW Aftercare Discharge Planning Group Note   06/26/2015 10:04 AM  Participation Quality:  DID NOT ATTEND. Pt chose to remain in hallway.   Smart, Alaira Level LCSW

## 2015-06-26 NOTE — Progress Notes (Signed)
Pt attended group on the 400 hall this evening.  
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
## 2015-06-27 LAB — VALPROIC ACID LEVEL: Valproic Acid Lvl: 26 ug/mL — ABNORMAL LOW (ref 50.0–100.0)

## 2015-06-27 MED ORDER — CITALOPRAM HYDROBROMIDE 20 MG PO TABS: 20.0000 mg | ORAL_TABLET | Freq: Every day | ORAL | Status: DC

## 2015-06-27 MED ORDER — NICOTINE 21 MG/24HR TD PT24: 21.0000 mg | MEDICATED_PATCH | Freq: Every day | TRANSDERMAL | Status: DC

## 2015-06-27 MED ORDER — DIVALPROEX SODIUM ER 250 MG PO TB24: 750.0000 mg | ORAL_TABLET | Freq: Every day | ORAL | Status: DC

## 2015-06-27 MED ORDER — DIVALPROEX SODIUM ER 250 MG PO TB24
750.0000 mg | ORAL_TABLET | Freq: Every day | ORAL | Status: DC
  Filled 2015-06-27: qty 21

## 2015-06-27 MED ORDER — QUETIAPINE FUMARATE 200 MG PO TABS: 200.0000 mg | ORAL_TABLET | Freq: Every day | ORAL | Status: DC

## 2015-06-27 MED ORDER — CLONAZEPAM 1 MG PO TABS: 1.0000 mg | ORAL_TABLET | Freq: Two times a day (BID) | ORAL | Status: DC | PRN

## 2015-06-27 NOTE — Progress Notes (Signed)
  [facility] Adult Case Management Discharge Plan :  Will you be returning to the same living situation after discharge:  Yes,  return home At discharge, do you have transportation home?: Yes,  family member Do you have the ability to pay for your medications: Yes,  mental health  Release of information consent forms completed and submitted to medical records by CSW.  Patient to Follow up at: Follow-up Information    Follow up with [facility].   Why:  Walk in between 8am-9am Monday through Friday for hospital follow-up/medication management/assessment for counseling services.    Contact information:   [address], [place] [address] Phone: [phone] Fax: [phone]      [facility] with [facility]  On 07/02/2015.   Why:  Appt on this date 1:00PM with Tracie for counseling. IF YOU MUST CANCEL OR RESCHEDULE, PLEASE CALL OFFICE WITHIN 48 HOURS OF APPT OR YOU WILL NOT BE ABLE TO BE SEEN AT THIS AGENCY. Thank you.    Contact information:   [address] [place] [address] Phone: [phone] Fax: [phone]      Next level of care provider has access to [facility] Link:no  Safety Planning and Suicide Prevention discussed: Yes,  SPE completed with pt, as pt declined consent to contact family member. Pt also provided with SPI pamphlet, Mobile crisis information  Have you used any form of tobacco in the last 30 days? (Cigarettes, Smokeless Tobacco, Cigars, and/or Pipes): Patient Refused Screening  Has patient been referred to the Quitline?: N/A patient is not a smoker  Patient has been referred for addiction treatment: N/A  Smart, Kaile Bixler LCSW 06/27/2015, 12:30 PM
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
## 2015-06-27 NOTE — Progress Notes (Signed)
Patient ID: Fred Ledesma., male   DOB: 1985-10-22, 30 y.o.   MRN: [id number]  D: Patient is at baseline of functioning today. Irritable this am but very excited when he was able to be discharged today. Denies any SI today. Very happy that he has remained "clean" for 70 days. A: Obtained all belongings, medications, sample meds, and follow-up appointment. R: Grandmother here to pick her up.

## 2015-06-27 NOTE — BHH Suicide Risk Assessment (Signed)
[facility] Discharge Suicide Risk Assessment   Principal Problem: Bipolar disorder, curr episode mixed, severe, w/o psychotic features [facility]) Discharge Diagnoses:  Patient Active Problem List   Diagnosis Date Noted  . 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 Opioid use disorder, moderate, in sustained remission [F11.90] 06/23/2015  . Cocaine use disorder, moderate, in sustained remission [F14.90] 06/23/2015  . Hyperprolactinemia (HCC) [E22.1] 06/23/2015  . Bipolar disorder, curr episode mixed, severe, w/o psychotic features (HCC) [F31.63] 04/20/2015  . PTSD (post-traumatic stress disorder) [F43.10] 04/20/2015  . Cannabis use disorder, severe, dependence (HCC) [F12.20] 04/20/2015  . Tobacco use disorder [F17.200] 04/20/2015    Total Time spent with patient: 20 minutes  Musculoskeletal: Strength & Muscle Tone: within normal limits Gait & Station: normal Patient leans: normal  Psychiatric Specialty Exam: Review of Systems  Constitutional: Negative.   HENT: Negative.   Eyes: Negative.   Respiratory: Negative.   Cardiovascular: Negative.   Gastrointestinal: Negative.   Genitourinary: Negative.   Musculoskeletal: Negative.   Skin: Negative.   Neurological: Negative.   Endo/Heme/Allergies: Negative.   Psychiatric/Behavioral: Positive for substance abuse.    Blood pressure 122/80, pulse 84, temperature 97.3 F (36.3 C), temperature source Oral, resp. rate 18, height  (1.626 m), weight 81.647 kg (180 lb), SpO2 98 %.Body mass index is 30.88 kg/(m^2).  General Appearance: Fairly Groomed  Patent attorney::  Fair  Speech:  Clear and Coherent409  Volume:  Normal  Mood:  Anxious  Affect:  Appropriate  Thought Process:  Coherent and Goal Directed  Orientation:  Full (Time, Place, and Person)  Thought Content:  plans as he moves on, relapse prevention plan  Suicidal Thoughts:  No  Homicidal Thoughts:  No  Memory:  Immediate;   Fair Recent;   Fair Remote;   Fair   Judgement:  Fair  Insight:  Present  Psychomotor Activity:  Normal  Concentration:  Fair  Recall:  [employer] of Knowledge:Fair  Language: Fair  Akathisia:  No  Handed:  Right  AIMS (if indicated):     Assets:  Housing Social Support  Sleep:  Number of Hours: 6.25  Cognition: WNL  ADL's:  Intact   Mental Status Per Nursing Assessment::   On Admission:     Demographic Factors:  Male  Loss Factors: NA  Historical Factors: NA  Risk Reduction Factors:   Employed, Living with another person, especially a relative and Positive social support  Continued Clinical Symptoms:  Bipolar Disorder:   Mixed State Alcohol/Substance Abuse/Dependencies  Cognitive Features That Contribute To Risk:  Closed-mindedness, Polarized thinking and Thought constriction (tunnel vision)    Suicide Risk:  Mild:  Suicidal ideation of limited frequency, intensity, duration, and specificity.  There are no identifiable plans, no associated intent, mild dysphoria and related symptoms, good self-control (both objective and subjective assessment), few other risk factors, and identifiable protective factors, including available and accessible social support.  Follow-up Information    Follow up with [facility].   Why:  Walk in between 8am-9am Monday through Friday for hospital follow-up/medication management/assessment for counseling services.    Contact information:   [address], [place] [address] Phone: [phone] Fax: [phone]      [facility] with [facility]  On 07/02/2015.   Why:  Appt on this date 1:00PM with Tracie for counseling. IF YOU MUST CANCEL OR RESCHEDULE, PLEASE CALL OFFICE WITHIN 48 HOURS OF APPT OR YOU WILL NOT BE ABLE TO BE SEEN AT THIS AGENCY. Thank you.  Contact information:   [address], [place] [address] Phone: [phone] Fax: [phone]      Plan Of Care/Follow-up recommendations:  Activity:  as tolerated Diet:  regular Follow up as  above Teniqua Marron A, MD 06/27/2015, 12:06 PM

## 2015-06-27 NOTE — Tx Team (Signed)
Interdisciplinary Treatment Plan Update (Adult)  Date:  06/27/2015  Time Reviewed:  12:28 PM   Progress in Treatment: Attending groups: Yes-pt is programming on [address].  Participating in groups:  Yes.  Taking medication as prescribed:  Yes. Tolerating medication:  Yes. Family/Significant othe contact made:  SPE completed with pt, as he did not consent to family contact.  Patient understands diagnosis:  Yes. and As evidenced by:  seeking treatment for SI with plan, increased depression, marijuana abuse, and medication stabilization. Discussing patient identified problems/goals with staff:  Yes. Medical problems stabilized or resolved:  Yes. Denies suicidal/homicidal ideation: No.Passive SI/Able to contract for safety.  Issues/concerns per patient self-inventory:  Other:  Discharge Plan or Barriers: CSW plans to return home and follow-up at [facility] (for counseling).   Reason for Continuation of Hospitalization: None  Comments:  Fred Reynolds. is an 30 y.o. male presenting to [facility] reporting suicidal ideations with a plan to shoot self or jump from a bridge. Pt stated "I am having suicidal thoughts and depression". "I am sick of being on this earth". "The past 2-3 days everything that was going good for me was taken away on yesterday". Pt reported that he was clean for 65 days. Pt reported that he was recently kicked out of a program at the [facility] where he was involved with the victory program, working and completing GED. Pt reported that he was kicked out of the program because he slammed the door after the staff accused him of hoarding his medication.  Pt reported suicidal ideations with a plan to shoot self or jump from a bridge. Pt did not report any previous suicide attempts or self -injurious behaviors. Pt denied HI and AVH at this time. Pt reported that he is currently receiving mental health treatment and shared that he has been  hospitalized in the past. Pt reported that he is dealing with multiple stressors and is reporting several depressive symptoms. Pt reported that he has abused heroin, cocaine and THC in the past and used THC on 06-20-15. Pt did not report any physical, sexual or emotional abuse at this time. Diagnosis: Bipolar Disorder, Cannabis Use Disorder, Opioid Use Disorder, Cocaine Use Disorder  Estimated length of stay:  D/c today   Additional Comments:  Patient and CSW reviewed pt's identified goals and treatment plan. Patient verbalized understanding and agreed to treatment plan. CSW reviewed [facility] "Discharge Process and Patient Involvement" Form. Pt verbalized understanding of information provided and signed form.    Review of initial/current patient goals per problem list:  1. Goal(s): Patient will participate in aftercare plan  Met: Yes  Target date: at discharge  As evidenced by: Patient will participate within aftercare plan AEB aftercare provider and housing plan at discharge being identified.  1/16: Pt plans to stay with his parents at d/c and plans to follow-up at [facility] for counseling.   2. Goal (s): Patient will exhibit decreased depressive symptoms and suicidal ideations.  Met: Yes   Target date: at discharge  As evidenced by: Patient will utilize self rating of depression at 3 or below and demonstrate decreased signs of depression or be deemed stable for discharge by MD.  1/16: Pt rates depression as high and is endorsing passive SI/able to contract for safety on the unit.   1/19: Pt rates depression as low and denies Si/Hi/AVH. "I feel like I'm ready to go today."   3. Goal(s): Patient will demonstrate decreased signs of withdrawal due  to substance abuse  Met: Yes   Target date:at discharge   As evidenced by: Patient will produce a CIWA/COWS score of 0, have stable vitals signs, and no symptoms of withdrawal.  1/16: Pt reports no signs of  withdrawal with CIWA/COWS of 0 and stable vitals.   Attendees: Patient:   06/27/2015 12:28 PM   Family:   06/27/2015 12:28 PM   Physician:  Dr. Carlton Adam, MD 06/27/2015 12:28 PM   Nursing: Sherrine Maples RN 06/27/2015 12:28 PM   Clinical Social Worker: Maxie Better, LCSW 06/27/2015 12:28 PM   Clinical Social Worker: Erasmo Downer Drinkard LCSWA; Peri Maris LCSWA 06/27/2015 12:28 PM   Other:  06/27/2015 12:28 PM   Other:  Huel Cote NP 06/27/2015 12:28 PM   Other:   06/27/2015 12:28 PM   Other:  06/27/2015 12:28 PM   Other:  06/27/2015 12:28 PM   Other:  06/27/2015 12:28 PM    06/27/2015 12:28 PM    06/27/2015 12:28 PM    06/27/2015 12:28 PM    06/27/2015 12:28 PM    Scribe for Treatment Team:   Maxie Better, LCSW 06/27/2015 12:28 PM

## 2015-06-27 NOTE — BHH Suicide Risk Assessment (Signed)
BHH INPATIENT:  Family/Significant Other Suicide Prevention Education  Suicide Prevention Education:  Patient Refusal for Family/Significant Other Suicide Prevention Education: The patient Fred Reynolds. has refused to provide written consent for family/significant other to be provided Family/Significant Other Suicide Prevention Education during admission and/or prior to discharge.  Physician notified.  SPE completed with pt, as pt refused to consent to family contact. SPI pamphlet provided to pt and pt was encouraged to share information with support network, ask questions, and talk about any concerns relating to SPE. Pt denies access to guns/firearms and verbalized understanding of information provided. Mobile Crisis information also provided to pt.   Smart, Berdina Cheever LCSW 06/27/2015, 12:28 PM

## 2015-06-28 ENCOUNTER — Encounter (HOSPITAL_BASED_OUTPATIENT_CLINIC_OR_DEPARTMENT_OTHER): Payer: No Typology Code available for payment source | Admitting: Clinical

## 2015-06-28 DIAGNOSIS — F3160 Bipolar disorder, current episode mixed, unspecified: Secondary | ICD-10-CM

## 2015-06-28 NOTE — Patient Instructions (Signed)
PLAN: 1. F/U with behavioral health consultant in one week 2. Psychiatric Medications: Celexa, Depakote, Seroquel, Klonipin (wants switch back to Xanax) 3. Behavioral recommendation(s):  -Monday, 07-01-15: [facility] clinic for [facility] med management -Tuesday, 07-02-15: [facility] appointment for counseling -Friday, 07-05-15: Financial counseling appointment at [facility] to apply for orange card and [employer] discount -Continue with dental appointments in [facility] -Consider reading educational material regarding coping with symptoms of depression -Safety Plan: Call Mobile Crisis line and own social support network contacts, as needed

## 2015-06-28 NOTE — Progress Notes (Signed)
ASSESSMENT: Pt currently experiencing bipolar I disorder, mixed episode. Pt needs to f/u with psychiatry and [facility], and establish PCP. Pt would benefit from psychoeducation and therapeutic interventions to cope with symptoms of bipolar I disorder. Stage of Change: determination  PLAN: 1. F/U with behavioral health consultant in one week 2. Psychiatric Medications: Celexa, Depakote, Seroquel, Klonipin(prefers Xanax). 3. Behavioral recommendation(s):   -Monday, 07-01-15: [facility] clinic for [facility] med management -Tuesday, 07-02-15: [facility] appointment for counseling -Friday, 07-05-15: Financial counseling appointment at [facility] to apply for orange card and Hilda discount -Continue with dental appointments in [facility] -Consider reading educational material regarding coping with symptoms of depression -Safety: Call Mobile Crisis line and own social support people as needed  SUBJECTIVE: Pt. referred by financial counseling for depression:  Pt. reports the following symptoms/concerns: Pt states that he is 72 days clean (heroin), first full day out of [employer], living with parents, has good social support, has appointments set up for counseling, financial counseling, dental appointments [facility]), and needs to make sure he can get his BH meds, as he will be out within one week. He was suicidal, standing on bridge, prior to admission to [facility], stopped taking meds and became suicidal again upon release. Proud of obtaining GED.  Duration of problem: less than one week Severity: severe  OBJECTIVE: Orientation & Cognition: Oriented x3. Thought processes normal and appropriate to situation. Mood: appropriate. Affect: appropriate Appearance: appropriate Risk of harm to self or others: low risk of harm to self today, no risk of harm to others Substance use: (heroin)(cocaine)(marijuana), alcohol, unknown current  Assessments administered: none, will  assess at next visit  Diagnosis: Bipolar disorder, current episode mixed CPT Code: F31.60 -------------------------------------------- Other(s) present in the room: none  Time spent with patient in exam room: 20 minutes

## 2015-07-05 ENCOUNTER — Ambulatory Visit: Payer: No Typology Code available for payment source | Attending: Family Medicine

## 2015-07-05 ENCOUNTER — Encounter: Payer: Self-pay | Admitting: Clinical

## 2015-07-05 NOTE — Progress Notes (Signed)
[name] was given one bus pass to get to [facility] walk-in clinic on 07-08-15, as he will refill his BH medications at that time; he states he has already gone to [facility] to reschedule a counseling appointment. It is recommended that if [name] feels he is having a BH crisis, he should go to the ED; he expressed understanding and agreement.

## 2015-07-09 ENCOUNTER — Telehealth: Payer: Self-pay | Admitting: Clinical

## 2015-07-09 NOTE — Telephone Encounter (Signed)
Fred Reynolds went to [facility] walk-in clinic, was unable to see psychiatrist after an almost seven hour wait, and does not have an appointment until March 2017. He cannot find his [facility] scripts he received at [facility], and says that [facility] will not issue replacement scripts. Fred Reynolds wants to establish care with a PCP, and is directed to make an appointment with [facility], as they are currently taking new patients, whereas [facility] is not currently taking new patients. Fred Reynolds is out of his BH medications, and needs to be seen as soon as possible, for medication management.

## 2015-07-10 ENCOUNTER — Telehealth: Payer: Self-pay | Admitting: Clinical

## 2015-07-10 NOTE — Telephone Encounter (Signed)
Attempt to f/u with pt, left HIPPA-compliant message to call Asher Muir from [facility] at [phone].   If pt is able to get to [facility] at [address], [place], [place], he may go to walk-in clinic between 9am-5pm, Mon-Friday. [facility] says that the wait time is "one or two hours", and to bring a photo ID and any insurance information, but that they will not turn a patient away without insurance.

## 2015-07-11 ENCOUNTER — Emergency Department (HOSPITAL_COMMUNITY)
Admission: EM | Admit: 2015-07-11 | Discharge: 2015-07-13 | Disposition: A | Discharge status: Home / Self Care | Payer: Federal, State, Local not specified - Other | Attending: Emergency Medicine | Admitting: Emergency Medicine

## 2015-07-11 ENCOUNTER — Telehealth: Payer: Self-pay | Admitting: Clinical

## 2015-07-11 ENCOUNTER — Telehealth: Payer: Self-pay | Admitting: General Practice

## 2015-07-11 ENCOUNTER — Encounter (HOSPITAL_COMMUNITY): Payer: Self-pay | Admitting: Emergency Medicine

## 2015-07-11 DIAGNOSIS — Z8739 Personal history of other diseases of the musculoskeletal system and connective tissue: Secondary | ICD-10-CM | POA: Insufficient documentation

## 2015-07-11 DIAGNOSIS — F32A Depression, unspecified: Secondary | ICD-10-CM

## 2015-07-11 DIAGNOSIS — Z8781 Personal history of (healed) traumatic fracture: Secondary | ICD-10-CM | POA: Insufficient documentation

## 2015-07-11 DIAGNOSIS — F1310 Sedative, hypnotic or anxiolytic abuse, uncomplicated: Secondary | ICD-10-CM | POA: Insufficient documentation

## 2015-07-11 DIAGNOSIS — F329 Major depressive disorder, single episode, unspecified: Secondary | ICD-10-CM

## 2015-07-11 DIAGNOSIS — F17210 Nicotine dependence, cigarettes, uncomplicated: Secondary | ICD-10-CM | POA: Insufficient documentation

## 2015-07-11 DIAGNOSIS — F1210 Cannabis abuse, uncomplicated: Secondary | ICD-10-CM | POA: Insufficient documentation

## 2015-07-11 DIAGNOSIS — Z8614 Personal history of Methicillin resistant Staphylococcus aureus infection: Secondary | ICD-10-CM | POA: Insufficient documentation

## 2015-07-11 DIAGNOSIS — Z79899 Other long term (current) drug therapy: Secondary | ICD-10-CM | POA: Insufficient documentation

## 2015-07-11 DIAGNOSIS — F4310 Post-traumatic stress disorder, unspecified: Secondary | ICD-10-CM | POA: Diagnosis present

## 2015-07-11 DIAGNOSIS — J45909 Unspecified asthma, uncomplicated: Secondary | ICD-10-CM | POA: Insufficient documentation

## 2015-07-11 DIAGNOSIS — F419 Anxiety disorder, unspecified: Secondary | ICD-10-CM | POA: Insufficient documentation

## 2015-07-11 DIAGNOSIS — F3130 Bipolar disorder, current episode depressed, mild or moderate severity, unspecified: Secondary | ICD-10-CM | POA: Insufficient documentation

## 2015-07-11 DIAGNOSIS — F1220 Cannabis dependence, uncomplicated: Secondary | ICD-10-CM | POA: Diagnosis present

## 2015-07-11 LAB — URINALYSIS, ROUTINE W REFLEX MICROSCOPIC
Bilirubin Urine: NEGATIVE
GLUCOSE, UA: NEGATIVE mg/dL
HGB URINE DIPSTICK: NEGATIVE
KETONES UR: NEGATIVE mg/dL
Leukocytes, UA: NEGATIVE
Nitrite: NEGATIVE
PROTEIN: NEGATIVE mg/dL
Specific Gravity, Urine: 1.022 (ref 1.005–1.030)
pH: 7 (ref 5.0–8.0)

## 2015-07-11 LAB — CBC WITH DIFFERENTIAL/PLATELET
BASOS PCT: 1 %
Basophils Absolute: 0.1 10*3/uL (ref 0.0–0.1)
EOS ABS: 0.2 10*3/uL (ref 0.0–0.7)
EOS PCT: 2 %
HCT: 47.1 % (ref 39.0–52.0)
Hemoglobin: 16.2 g/dL (ref 13.0–17.0)
LYMPHS ABS: 2.8 10*3/uL (ref 0.7–4.0)
Lymphocytes Relative: 30 %
MCH: 31.5 pg (ref 26.0–34.0)
MCHC: 34.4 g/dL (ref 30.0–36.0)
MCV: 91.5 fL (ref 78.0–100.0)
MONOS PCT: 5 %
Monocytes Absolute: 0.5 10*3/uL (ref 0.1–1.0)
NEUTROS PCT: 62 %
Neutro Abs: 5.8 10*3/uL (ref 1.7–7.7)
PLATELETS: 231 10*3/uL (ref 150–400)
RBC: 5.15 MIL/uL (ref 4.22–5.81)
RDW: 14.5 % (ref 11.5–15.5)
WBC: 9.4 10*3/uL (ref 4.0–10.5)

## 2015-07-11 LAB — COMPREHENSIVE METABOLIC PANEL
ALBUMIN: 4.3 g/dL (ref 3.5–5.0)
ALK PHOS: 63 U/L (ref 38–126)
ALT: 30 U/L (ref 17–63)
ANION GAP: 10 (ref 5–15)
AST: 33 U/L (ref 15–41)
BUN: 9 mg/dL (ref 6–20)
CALCIUM: 9.1 mg/dL (ref 8.9–10.3)
CHLORIDE: 104 mmol/L (ref 101–111)
CO2: 23 mmol/L (ref 22–32)
Creatinine, Ser: 0.79 mg/dL (ref 0.61–1.24)
GFR calc non Af Amer: >60 mL/min (ref >60)
GLUCOSE: 105 mg/dL — AB (ref 65–99)
POTASSIUM: 4.3 mmol/L (ref 3.5–5.1)
SODIUM: 137 mmol/L (ref 135–145)
Total Bilirubin: 0.6 mg/dL (ref 0.3–1.2)
Total Protein: 7.8 g/dL (ref 6.5–8.1)

## 2015-07-11 LAB — RAPID URINE DRUG SCREEN, HOSP PERFORMED
AMPHETAMINES: NOT DETECTED
BENZODIAZEPINES: POSITIVE — AB
Barbiturates: NOT DETECTED
Cocaine: NOT DETECTED
OPIATES: NOT DETECTED
Tetrahydrocannabinol: POSITIVE — AB

## 2015-07-11 LAB — VALPROIC ACID LEVEL: Valproic Acid Lvl: <10 ug/mL — ABNORMAL LOW (ref 50.0–100.0)

## 2015-07-11 LAB — SALICYLATE LEVEL

## 2015-07-11 LAB — ETHANOL

## 2015-07-11 LAB — ACETAMINOPHEN LEVEL: Acetaminophen (Tylenol), Serum: <10 ug/mL — ABNORMAL LOW (ref 10–30)

## 2015-07-11 MED ORDER — SODIUM CHLORIDE 0.9 % IV SOLN: INTRAVENOUS | Status: DC

## 2015-07-11 MED ORDER — ZOLPIDEM TARTRATE 5 MG PO TABS: 5.0000 mg | ORAL_TABLET | Freq: Every evening | ORAL | Status: DC | PRN

## 2015-07-11 MED ORDER — ONDANSETRON HCL 4 MG PO TABS: 4.0000 mg | ORAL_TABLET | Freq: Three times a day (TID) | ORAL | Status: DC | PRN

## 2015-07-11 MED ORDER — IBUPROFEN 200 MG PO TABS
600.0000 mg | ORAL_TABLET | Freq: Three times a day (TID) | ORAL | Status: DC | PRN
Start: 2015-07-11 — End: 2015-07-13

## 2015-07-11 MED ORDER — QUETIAPINE FUMARATE 100 MG PO TABS
200.0000 mg | ORAL_TABLET | Freq: Every day | ORAL | Status: DC
  Administered 2015-07-11 – 2015-07-12 (×2): 200 mg via ORAL
  Filled 2015-07-11 (×2): qty 2

## 2015-07-11 MED ORDER — CITALOPRAM HYDROBROMIDE 20 MG PO TABS
20.0000 mg | ORAL_TABLET | Freq: Every day | ORAL | Status: DC
  Administered 2015-07-12 – 2015-07-13 (×2): 20 mg via ORAL
  Filled 2015-07-11 (×4): qty 1

## 2015-07-11 MED ORDER — CLONAZEPAM 0.5 MG PO TABS
1.0000 mg | ORAL_TABLET | Freq: Two times a day (BID) | ORAL | Status: DC | PRN
  Administered 2015-07-11 – 2015-07-12 (×2): 1 mg via ORAL
  Filled 2015-07-11 (×2): qty 1

## 2015-07-11 MED ORDER — DIVALPROEX SODIUM ER 500 MG PO TB24
750.0000 mg | ORAL_TABLET | Freq: Every day | ORAL | Status: DC
  Administered 2015-07-11: 750 mg via ORAL
  Filled 2015-07-11 (×2): qty 1

## 2015-07-11 MED ORDER — ALUM & MAG HYDROXIDE-SIMETH 200-200-20 MG/5ML PO SUSP: 30.0000 mL | ORAL | Status: DC | PRN

## 2015-07-11 MED ORDER — LORAZEPAM 1 MG PO TABS: 1.0000 mg | ORAL_TABLET | Freq: Three times a day (TID) | ORAL | Status: DC | PRN
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
## 2015-07-11 NOTE — BH Assessment (Signed)
Assessment completed. Consulted Hulan Fess, NP who recommended that pt be evaluated by psychiatry in the morning. Informed Lonia Skinner. Keenan Bachelor, PA-C of the recommendation.

## 2015-07-11 NOTE — Telephone Encounter (Signed)
First and second attempt to f/u with pt call; left HIPPA-compliant message to call back Fred Reynolds at [facility] at [phone].  Pt mother called back  from [place] crisis, saying she and son did not know why he was being held and not allowed to leave, and that Fred Reynolds had not eaten, had been waiting at [facility] since this morning. Call to [facility] crisis, talked to nurse manager Fred Reynolds, who states that Fred Reynolds and his mother Fred Reynolds, left [place], prior to his IVC paperwork completion.  Pt returned call, said that he and mother had been at [facility] since 7:45am, had not eaten a meal since arriving, he was seen by a tele-psychiatrist, who said that pt seemed "anxious", and that Fred Reynolds was going to admit him, after increasing Seroquel, taking away Depakote. Patient stated that he did not want to be admitted, that he was there to be able to obtain the medications that he had previously been prescribed by [facility], but had been unable to fill because of the high cost of Depakote ER and Seroquel without insurance, could not find paper scripts, and was told by [facility] that they cannot provide additional scripts if first is lost. He stated that he had recently spent 7 days in [facility], did not have any reason to be admitted again as he was neither suicidal nor homicidal, and was not given a reason as to why Fred Reynolds crisis wanted to admit him into their crisis unit, for an undetermined length of stay.  Mother Fred Reynolds spoke to [facility] in [facility];  [facility] says they will do initial assessments between 8am-3pm for walk-in, but that it would take 2-3 weeks before Fred Reynolds would have an appointment with a psychiatrist to evaluate him for medications.   Pt is making every attempt to obtain the medications prescribed to him at [facility], as his goal is to keep himself healthy, but does not want to be admitted into a inpatient facility at this time, and still does not have needed medications.

## 2015-07-11 NOTE — ED Notes (Signed)
Pt went to [facility] twice this week and after he left they sent the police to pick him up under IVC papers  Pt states he was at behavioral health recently and was discharged with one week of medication  Pt states he has attempted to be seen at various places to get his medication but none could see him til march or April  Pt states he has an appt with [facility] on Monday   Pt denies SI or HI at this time  Paperwork per [place] states pt has history of multiple psychiatric hospitalizations and presents with suicidal and homicidal ideation  Pt has been off his meds and is irritable and anxious
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
## 2015-07-11 NOTE — Telephone Encounter (Signed)
Pt. Mother called stating pt. Has been trying to get in touch with the social worker and can to not get  In touch with her. Pt. Is in [place] is they are not letting him out, he has not eating anything and does not know what to do. Please f/u with pt.

## 2015-07-12 DIAGNOSIS — F4310 Post-traumatic stress disorder, unspecified: Secondary | ICD-10-CM | POA: Diagnosis not present

## 2015-07-12 DIAGNOSIS — F1220 Cannabis dependence, uncomplicated: Secondary | ICD-10-CM | POA: Diagnosis not present

## 2015-07-12 DIAGNOSIS — F3163 Bipolar disorder, current episode mixed, severe, without psychotic features: Secondary | ICD-10-CM | POA: Diagnosis not present

## 2015-07-12 MED ORDER — TRAZODONE HCL 50 MG PO TABS: 50.0000 mg | ORAL_TABLET | Freq: Every evening | ORAL | Status: DC | PRN

## 2015-07-12 MED ORDER — DIVALPROEX SODIUM 500 MG PO DR TAB
750.0000 mg | DELAYED_RELEASE_TABLET | Freq: Every day | ORAL | Status: DC
  Administered 2015-07-12: 750 mg via ORAL
  Filled 2015-07-12: qty 1

## 2015-07-12 NOTE — ED Notes (Addendum)
Pt presents with IVC papers taken out by [facility], after pt states he wanted to destroy facility.  Denies SI, HI or AVH.  Pt admits to Hx of Bipolar DO and Anxiety.  Monitoring for safety, Q 15 min checks in effect.

## 2015-07-12 NOTE — ED Notes (Signed)
Patient is A&Ox4, denies SI/HI A/V hallucinations. Patient states he never wanted to hurt himself or anyone else, he just was mad at [facility] for not giving him his medications as prescribed. Patient is calm and cooperative with a bright affect.  Darrin Apodaca, Wyman Songster, RN

## 2015-07-12 NOTE — ED Notes (Signed)
Pt. Noted sleeping in room. No complaints or concerns voiced. No distress or abnormal behavior noted. Will continue to monitor with security cameras. Q 15 minute rounds continue. 

## 2015-07-12 NOTE — ED Notes (Signed)
Pt. Noted in room. No complaints or concerns voiced. No distress or abnormal behavior noted. Will continue to monitor with security cameras. Q 15 minute rounds continue. 

## 2015-07-12 NOTE — ED Notes (Signed)
Report received from Angela RN. Pt. Alert and oriented in no distress denies SI, HI, AVH and pain.  Pt. Instructed to come to me with problems or concerns.Will continue to monitor for safety via security cameras and Q 15 minute checks. 
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
## 2015-07-13 DIAGNOSIS — F1220 Cannabis dependence, uncomplicated: Secondary | ICD-10-CM

## 2015-07-13 DIAGNOSIS — F3163 Bipolar disorder, current episode mixed, severe, without psychotic features: Secondary | ICD-10-CM

## 2015-07-13 DIAGNOSIS — F4310 Post-traumatic stress disorder, unspecified: Secondary | ICD-10-CM

## 2015-07-13 MED ORDER — CLONAZEPAM 1 MG PO TABS: 0.5000 mg | ORAL_TABLET | Freq: Every day | ORAL | Status: DC

## 2015-07-13 MED ORDER — QUETIAPINE FUMARATE 200 MG PO TABS: 200.0000 mg | ORAL_TABLET | Freq: Every day | ORAL | Status: DC

## 2015-07-13 MED ORDER — DIVALPROEX SODIUM 250 MG PO DR TAB: 750.0000 mg | DELAYED_RELEASE_TABLET | Freq: Every day | ORAL | Status: DC

## 2015-07-13 MED ORDER — CITALOPRAM HYDROBROMIDE 20 MG PO TABS: 20.0000 mg | ORAL_TABLET | Freq: Every day | ORAL | Status: DC

## 2015-07-13 NOTE — ED Notes (Addendum)
Written DC instructions and prescriptions reviewed w/ Pt  Pt encouraged to follow up Monday as scheduled, take his medications as directed and return/seek treatment for any si/hi/avh thoughts or urges.  Pt verbalized understanding.  Pt ambulatory w/o difficulty to dc area, belongings returned after leaving the area.

## 2015-07-13 NOTE — ED Notes (Signed)
Pt. Noted sleeping in room. No complaints or concerns voiced. No distress or abnormal behavior noted. Will continue to monitor with security cameras. Q 15 minute rounds continue. 

## 2015-07-13 NOTE — [facility] Suicide Risk Assessment (Cosign Needed)
Suicide Risk Assessment  Discharge Assessment   [facility] Discharge Suicide Risk Assessment   Principal Problem: Cannabis use disorder, severe, dependence (HCC) Discharge Diagnoses:  Patient Active Problem List   Diagnosis Date Noted  . Cannabis use disorder, severe, dependence (HCC) [F12.20] 04/20/2015    Priority: Low  . 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 Opioid use disorder, moderate, in sustained remission [F11.90] 06/23/2015  . Cocaine use disorder, moderate, in sustained remission [F14.90] 06/23/2015  . Hyperprolactinemia (HCC) [E22.1] 06/23/2015  . Bipolar disorder, curr episode mixed, severe, w/o psychotic features (HCC) [F31.63] 04/20/2015  . PTSD (post-traumatic stress disorder) [F43.10] 04/20/2015  . Tobacco use disorder [F17.200] 04/20/2015    Total Time spent with patient: 20 minutes  Musculoskeletal: Strength & Muscle Tone: within normal limits Gait & Station: normal Patient leans: N/A  Psychiatric Specialty Exam:   Blood pressure 127/63, pulse 63, temperature 97.9 F (36.6 C), temperature source Oral, resp. rate 16, SpO2 99 %.There is no weight on file to calculate BMI.  General Appearance: Casual  Eye Contact:: Good  Speech: Normal Rate  Volume: Normal  Mood: Depressed  Affect: Congruent  Thought Process: Coherent  Orientation: Full (Time, Place, and Person)  Thought Content: Rumination  Suicidal Thoughts: No  Homicidal Thoughts: No  Memory: Immediate; Good Recent; Good Remote; Good  Judgement: Fair  Insight: Fair  Psychomotor Activity: Decreased  Concentration: Fair  Recall: Fair  Fund of Knowledge:Fair  Language: Good  Akathisia: No  Handed: Right  AIMS (if indicated):   Assets: Housing Leisure Time Physical Health Resilience Social Support  ADL's: Intact  Cognition: WNL               Mental Status Per Nursing Assessment::   On  Admission:     Demographic Factors:  Male, Adolescent or young adult, Low socioeconomic status and Unemployed  Loss Factors: Financial problems/change in socioeconomic status  Historical Factors: NA  Risk Reduction Factors:   Living with another person, especially a relative  Continued Clinical Symptoms:  Bipolar Disorder:   Depressive phase Depression:   Insomnia  Cognitive Features That Contribute To Risk:  Polarized thinking    Suicide Risk:  Minimal: No identifiable suicidal ideation.  Patients presenting with no risk factors but with morbid ruminations; may be classified as minimal risk based on the severity of the depressive symptoms    Plan Of Care/Follow-up recommendations:  Activity:  As tolerated Diet:  Regular  Earney Navy, NP    PMHNP-BC 07/13/2015, 2:54 PM

## 2015-07-13 NOTE — Consult Note (Signed)
Psychiatric Specialty Exam: Physical Exam  ROS  Blood pressure 100/55, pulse 60, temperature 98.1 F (36.7 C), temperature source Oral, resp. rate 16, SpO2 95 %.There is no weight on file to calculate BMI.  General Appearance: Casual  Eye Contact:: Good  Speech: Normal Rate  Volume: Normal  Mood: Depressed  Affect: Congruent  Thought Process: Coherent  Orientation: Full (Time, Place, and Person)  Thought Content: Rumination  Suicidal Thoughts: No  Homicidal Thoughts: No  Memory: Immediate; Good Recent; Good Remote; Good  Judgement: Fair  Insight: Fair  Psychomotor Activity: Decreased  Concentration: Fair  Recall: Fair  Fund of Knowledge:Fair  Language: Good  Akathisia: No  Handed: Right  AIMS (if indicated):    Assets: Housing Leisure Time Physical Health Resilience Social Support  ADL's: Intact  Cognition: WNL          Patient was seen this morning awake alert and oriented x4.  Patient denies SI/HI/AVH.  He admits that he was angry at [facility] staff for not listening to him and helping him out with his medications.    He has appointment with the wellness Center on Monday.  Patient vehemently denies wanting to destroy properties at [facility] and does not want to go there.  He has good insight in his illness and want to take his medications.  He is discharged home. Cannabis use disorder, severe, dependence (HCC)   Plan:  Discharge home.  Fred Reynolds    PMHNP-BC Patient seen face-to-face for psychiatric evaluation, chart reviewed and case discussed with the physician extender and developed treatment plan. Reviewed the information documented and agree with the treatment plan. Thedore Mins, MD

## 2015-07-13 NOTE — ED Notes (Addendum)
IVC has been rescinded per TTS.  PT is aware that he is going to be dc'd  Home, nad, watching tv. Will call for ride home

## 2015-07-14 ENCOUNTER — Telehealth: Payer: Self-pay | Admitting: *Deleted

## 2015-07-14 NOTE — Telephone Encounter (Signed)
Fred Reynolds had an appt at [facility] on 2/2 which he attended and they attempted to  IVC him,  No med fills? States he does not even have the 3.00 it would take to fill meds if I could assist. Also reports he has a Court Date in the am and is unsure of outcome. I suggested he go to [facility] immediately following his court appearance. I made him aware that I could do Nothing about his Clonopine  tabs which he takes 0.5mg  po Daily. He reports he still has Celexa. The documentation is ambiguous about whether he is supposed to take the Depakote and it appears he is only prescribed Seroquel  1 QHS. I suggested he take his [place] card and his Rxs to the [facility] after his court date on Monday. Called pt and made him aware of above.

## 2015-07-14 NOTE — Telephone Encounter (Signed)
After further discussion with Jonny Ruiz determined that he had seen provider at [facility] via "TV" screen x 15 minutes and she had attempted to "switch all of his medications around".  When he did not agree she attempted to IVC him. His plan is to return to [facility] where they will follow the recommendation of Behavioral Health and the medications he was prescribed at his last visit there. We talked about the importance of One provider and follow through with scheduled appointments. Kiron remained pleasant, calm and cooperative throughout our interaction on this day. Provided MATCH for Depakote and Seroquel. Faxed to local [facility].

## 2015-07-15 ENCOUNTER — Ambulatory Visit (HOSPITAL_BASED_OUTPATIENT_CLINIC_OR_DEPARTMENT_OTHER): Payer: No Typology Code available for payment source | Admitting: Clinical

## 2015-07-15 ENCOUNTER — Ambulatory Visit: Payer: No Typology Code available for payment source | Attending: Family Medicine | Admitting: Family Medicine

## 2015-07-15 ENCOUNTER — Encounter: Payer: Self-pay | Admitting: Family Medicine

## 2015-07-15 VITALS — BP 113/70 | HR 92 | Temp 97.5°F | Resp 14 | Ht 63.0 in | Wt 246.4 lb

## 2015-07-15 DIAGNOSIS — F418 Other specified anxiety disorders: Secondary | ICD-10-CM

## 2015-07-15 DIAGNOSIS — Z131 Encounter for screening for diabetes mellitus: Secondary | ICD-10-CM

## 2015-07-15 DIAGNOSIS — Z9889 Other specified postprocedural states: Secondary | ICD-10-CM | POA: Insufficient documentation

## 2015-07-15 DIAGNOSIS — Z6841 Body Mass Index (BMI) 40.0 and over, adult: Secondary | ICD-10-CM | POA: Insufficient documentation

## 2015-07-15 DIAGNOSIS — M25551 Pain in right hip: Secondary | ICD-10-CM | POA: Insufficient documentation

## 2015-07-15 DIAGNOSIS — E669 Obesity, unspecified: Secondary | ICD-10-CM | POA: Diagnosis not present

## 2015-07-15 DIAGNOSIS — Z8781 Personal history of (healed) traumatic fracture: Secondary | ICD-10-CM | POA: Insufficient documentation

## 2015-07-15 DIAGNOSIS — F3163 Bipolar disorder, current episode mixed, severe, without psychotic features: Secondary | ICD-10-CM

## 2015-07-15 DIAGNOSIS — F319 Bipolar disorder, unspecified: Secondary | ICD-10-CM | POA: Insufficient documentation

## 2015-07-15 DIAGNOSIS — M79641 Pain in right hand: Secondary | ICD-10-CM | POA: Insufficient documentation

## 2015-07-15 DIAGNOSIS — M79642 Pain in left hand: Secondary | ICD-10-CM

## 2015-07-15 DIAGNOSIS — Z79899 Other long term (current) drug therapy: Secondary | ICD-10-CM | POA: Insufficient documentation

## 2015-07-15 LAB — POCT GLYCOSYLATED HEMOGLOBIN (HGB A1C): HEMOGLOBIN A1C: 5.4

## 2015-07-15 MED ORDER — ACETAMINOPHEN 500 MG PO TABS: 500.0000 mg | ORAL_TABLET | Freq: Three times a day (TID) | ORAL | Status: DC | PRN
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
## 2015-07-15 NOTE — Progress Notes (Signed)
Patient here to establish care He is unhappy with his treatment at [facility] and is looking to be seen here for medical issues and needs somewhere else to be seen for mental health diagnoses He does have medications

## 2015-07-19 ENCOUNTER — Telehealth: Payer: Self-pay | Admitting: Clinical
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
## 2015-07-24 ENCOUNTER — Ambulatory Visit: Payer: Self-pay | Admitting: Clinical

## 2015-08-16 ENCOUNTER — Encounter: Payer: Self-pay | Admitting: Clinical

## 2015-08-16 NOTE — Progress Notes (Signed)
Depression screen PHQ 2/9 07/15/2015  Decreased Interest 1  Down, Depressed, Hopeless 3  PHQ - 2 Score 4  Altered sleeping 3  Tired, decreased energy 2  Change in appetite 3  Feeling bad or failure about yourself  1  Trouble concentrating 2  Moving slowly or fidgety/restless 0  Suicidal thoughts 1  PHQ-9 Score 16  *No plans to end life in past 2 weeks  GAD 7 : Generalized Anxiety Score 07/15/2015  Nervous, Anxious, on Edge 3  Control/stop worrying 2  Worry too much - different things 3  Trouble relaxing 3  Restless 3  Easily annoyed or irritable 1  Afraid - awful might happen 0  Total GAD 7 Score 15

## 2016-03-03 ENCOUNTER — Encounter (HOSPITAL_COMMUNITY): Payer: Self-pay | Admitting: *Deleted

## 2016-03-03 ENCOUNTER — Ambulatory Visit (HOSPITAL_COMMUNITY)
Admission: RE | Admit: 2016-03-03 | Discharge: 2016-03-03 | Disposition: A | Discharge status: Home / Self Care | Payer: Federal, State, Local not specified - Other | Source: Home / Self Care | Attending: Psychiatry | Admitting: Psychiatry

## 2016-03-03 ENCOUNTER — Emergency Department (HOSPITAL_COMMUNITY)
Admission: EM | Admit: 2016-03-03 | Discharge: 2016-03-03 | Disposition: A | Discharge status: Other Acute Inpatient Hospital | Payer: Federal, State, Local not specified - Other | Attending: Emergency Medicine | Admitting: Emergency Medicine

## 2016-03-03 ENCOUNTER — Inpatient Hospital Stay (HOSPITAL_COMMUNITY)
Admission: AD | Admit: 2016-03-03 | Discharge: 2016-03-11 | DRG: 885 | Disposition: A | Discharge status: Home / Self Care | Payer: Federal, State, Local not specified - Other | Source: Intra-hospital | Attending: Psychiatry | Admitting: Psychiatry

## 2016-03-03 ENCOUNTER — Encounter (HOSPITAL_COMMUNITY): Payer: Self-pay | Admitting: Emergency Medicine

## 2016-03-03 DIAGNOSIS — Z79899 Other long term (current) drug therapy: Secondary | ICD-10-CM | POA: Insufficient documentation

## 2016-03-03 DIAGNOSIS — F17210 Nicotine dependence, cigarettes, uncomplicated: Secondary | ICD-10-CM | POA: Diagnosis not present

## 2016-03-03 DIAGNOSIS — F1120 Opioid dependence, uncomplicated: Secondary | ICD-10-CM | POA: Diagnosis not present

## 2016-03-03 DIAGNOSIS — F3163 Bipolar disorder, current episode mixed, severe, without psychotic features: Secondary | ICD-10-CM | POA: Diagnosis not present

## 2016-03-03 DIAGNOSIS — F410 Panic disorder [episodic paroxysmal anxiety] without agoraphobia: Secondary | ICD-10-CM | POA: Diagnosis present

## 2016-03-03 DIAGNOSIS — Z811 Family history of alcohol abuse and dependence: Secondary | ICD-10-CM

## 2016-03-03 DIAGNOSIS — G4700 Insomnia, unspecified: Secondary | ICD-10-CM | POA: Diagnosis present

## 2016-03-03 DIAGNOSIS — F4310 Post-traumatic stress disorder, unspecified: Secondary | ICD-10-CM | POA: Diagnosis present

## 2016-03-03 DIAGNOSIS — Z888 Allergy status to other drugs, medicaments and biological substances status: Secondary | ICD-10-CM | POA: Diagnosis not present

## 2016-03-03 DIAGNOSIS — F314 Bipolar disorder, current episode depressed, severe, without psychotic features: Secondary | ICD-10-CM | POA: Insufficient documentation

## 2016-03-03 DIAGNOSIS — R45851 Suicidal ideations: Secondary | ICD-10-CM | POA: Diagnosis present

## 2016-03-03 DIAGNOSIS — J45909 Unspecified asthma, uncomplicated: Secondary | ICD-10-CM | POA: Diagnosis present

## 2016-03-03 DIAGNOSIS — F3160 Bipolar disorder, current episode mixed, unspecified: Secondary | ICD-10-CM | POA: Diagnosis present

## 2016-03-03 DIAGNOSIS — F1010 Alcohol abuse, uncomplicated: Secondary | ICD-10-CM | POA: Diagnosis present

## 2016-03-03 DIAGNOSIS — Z91030 Bee allergy status: Secondary | ICD-10-CM | POA: Diagnosis not present

## 2016-03-03 DIAGNOSIS — Z818 Family history of other mental and behavioral disorders: Secondary | ICD-10-CM | POA: Diagnosis not present

## 2016-03-03 DIAGNOSIS — F1310 Sedative, hypnotic or anxiolytic abuse, uncomplicated: Secondary | ICD-10-CM

## 2016-03-03 LAB — COMPREHENSIVE METABOLIC PANEL
ALBUMIN: 3.9 g/dL (ref 3.5–5.0)
ALK PHOS: 65 U/L (ref 38–126)
ALT: 20 U/L (ref 17–63)
ANION GAP: 7 (ref 5–15)
AST: 24 U/L (ref 15–41)
BILIRUBIN TOTAL: 0.5 mg/dL (ref 0.3–1.2)
BUN: 12 mg/dL (ref 6–20)
CALCIUM: 8.7 mg/dL — AB (ref 8.9–10.3)
CO2: 24 mmol/L (ref 22–32)
Chloride: 105 mmol/L (ref 101–111)
Creatinine, Ser: 0.86 mg/dL (ref 0.61–1.24)
GFR calc non Af Amer: >60 mL/min (ref >60)
GLUCOSE: 113 mg/dL — AB (ref 65–99)
POTASSIUM: 3.9 mmol/L (ref 3.5–5.1)
SODIUM: 136 mmol/L (ref 135–145)
TOTAL PROTEIN: 7.4 g/dL (ref 6.5–8.1)

## 2016-03-03 LAB — RAPID URINE DRUG SCREEN, HOSP PERFORMED
Amphetamines: NOT DETECTED
BENZODIAZEPINES: POSITIVE — AB
Barbiturates: NOT DETECTED
COCAINE: NOT DETECTED
OPIATES: POSITIVE — AB
Tetrahydrocannabinol: NOT DETECTED

## 2016-03-03 LAB — CBC
HEMATOCRIT: 42 % (ref 39.0–52.0)
Hemoglobin: 14.1 g/dL (ref 13.0–17.0)
MCH: 30.7 pg (ref 26.0–34.0)
MCHC: 33.6 g/dL (ref 30.0–36.0)
MCV: 91.5 fL (ref 78.0–100.0)
PLATELETS: 245 10*3/uL (ref 150–400)
RBC: 4.59 MIL/uL (ref 4.22–5.81)
RDW: 15.3 % (ref 11.5–15.5)
WBC: 9.7 10*3/uL (ref 4.0–10.5)

## 2016-03-03 LAB — ACETAMINOPHEN LEVEL

## 2016-03-03 LAB — ETHANOL: Alcohol, Ethyl (B): <5 mg/dL (ref <5)

## 2016-03-03 LAB — SALICYLATE LEVEL

## 2016-03-03 MED ORDER — ACETAMINOPHEN 325 MG PO TABS: 650.0000 mg | ORAL_TABLET | ORAL | Status: DC | PRN

## 2016-03-03 MED ORDER — QUETIAPINE FUMARATE ER 200 MG PO TB24
200.0000 mg | ORAL_TABLET | Freq: Every day | ORAL | Status: DC
  Administered 2016-03-03 – 2016-03-09 (×7): 200 mg via ORAL
  Filled 2016-03-03 (×9): qty 1

## 2016-03-03 MED ORDER — IBUPROFEN 200 MG PO TABS: 600.0000 mg | ORAL_TABLET | Freq: Three times a day (TID) | ORAL | Status: DC | PRN

## 2016-03-03 MED ORDER — QUETIAPINE FUMARATE ER 200 MG PO TB24
200.0000 mg | ORAL_TABLET | Freq: Every day | ORAL | Status: DC
  Filled 2016-03-03: qty 1

## 2016-03-03 MED ORDER — CITALOPRAM HYDROBROMIDE 10 MG PO TABS
20.0000 mg | ORAL_TABLET | Freq: Every day | ORAL | Status: DC
Start: 2016-03-03 — End: 2016-03-03
  Administered 2016-03-03: 20 mg via ORAL
  Filled 2016-03-03: qty 2

## 2016-03-03 MED ORDER — DIVALPROEX SODIUM ER 500 MG PO TB24
750.0000 mg | ORAL_TABLET | Freq: Every day | ORAL | Status: DC
  Administered 2016-03-03 – 2016-03-10 (×8): 750 mg via ORAL
  Filled 2016-03-03 (×10): qty 1

## 2016-03-03 MED ORDER — IBUPROFEN 600 MG PO TABS: 600.0000 mg | ORAL_TABLET | Freq: Three times a day (TID) | ORAL | Status: DC | PRN

## 2016-03-03 MED ORDER — ALUM & MAG HYDROXIDE-SIMETH 200-200-20 MG/5ML PO SUSP: 30.0000 mL | ORAL | Status: DC | PRN

## 2016-03-03 MED ORDER — MAGNESIUM HYDROXIDE 400 MG/5ML PO SUSP: 30.0000 mL | Freq: Every day | ORAL | Status: DC | PRN

## 2016-03-03 MED ORDER — CITALOPRAM HYDROBROMIDE 20 MG PO TABS
20.0000 mg | ORAL_TABLET | Freq: Every day | ORAL | Status: DC
  Administered 2016-03-04 – 2016-03-06 (×3): 20 mg via ORAL
  Filled 2016-03-03 (×5): qty 1

## 2016-03-03 MED ORDER — DIVALPROEX SODIUM ER 250 MG PO TB24: 750.0000 mg | ORAL_TABLET | Freq: Every day | ORAL | Status: DC

## 2016-03-03 MED ORDER — ONDANSETRON HCL 4 MG PO TABS: 4.0000 mg | ORAL_TABLET | Freq: Three times a day (TID) | ORAL | Status: DC | PRN

## 2016-03-03 MED ORDER — ZOLPIDEM TARTRATE 5 MG PO TABS: 5.0000 mg | ORAL_TABLET | Freq: Every evening | ORAL | Status: DC | PRN

## 2016-03-03 MED ORDER — NICOTINE 21 MG/24HR TD PT24
21.0000 mg | MEDICATED_PATCH | Freq: Every day | TRANSDERMAL | Status: DC
  Administered 2016-03-04 – 2016-03-08 (×5): 21 mg via TRANSDERMAL
  Filled 2016-03-03 (×7): qty 1
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
## 2016-03-03 NOTE — ED Notes (Signed)
Patient calm and cooperative on admit to the unit.  States he is just tired of living.  States he uses Dilaudid daily as well as Xanax, uses Dilaudid at least once per day and Xanax several times daily.  He buys his drugs off the street, but states he used to get Xanax from his doctor.  He requested a snack and went to sleep.  He has slept the rest of the afternoon.  [facility], but we have no time as yet.

## 2016-03-03 NOTE — Progress Notes (Signed)
Report received from admitting RN.  Introduced self to pt.  Actively listened to pt and offered support and encouragement.  Pt reports SI.  He verbally contracts for safety. Pt denies HI, denies hallucinations.  Medications administered per order.  Pt is safe on the unit.  Will continue to monitor and assess.

## 2016-03-03 NOTE — ED Notes (Signed)
Report called to RN Otto Herbreka, [facility], rm [id number].  Pending [facility] transport.

## 2016-03-03 NOTE — ED Notes (Signed)
Pt AAO x 3, no distress noted, calm & cooperative, sad and SI.  Monitoring for safety, Q 15 min checks in effect.  [facility]
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
## 2016-03-03 NOTE — Tx Team (Signed)
Initial Treatment Plan 03/03/2016 10:24 PM Fred MossesJohn Buswell Jr. HQI:[id number]    PATIENT STRESSORS: Marital or family conflict Medication change or noncompliance Substance abuse   PATIENT STRENGTHS: Motivation for treatment/growth Physical Health Supportive family/friends   PATIENT IDENTIFIED PROBLEMS: At risk for suicide  Substance Abuse  "Letitia Libraryna get clean"  "Feel better"               DISCHARGE CRITERIA:  Ability to meet basic life and health needs Adequate post-discharge living arrangements Improved stabilization in mood, thinking, and/or behavior Motivation to continue treatment in a less acute level of care Need for constant or close observation no longer present Verbal commitment to aftercare and medication compliance Withdrawal symptoms are absent or subacute and managed without 24-hour nursing intervention  PRELIMINARY DISCHARGE PLAN: Attend 12-step recovery group Outpatient therapy Placement in alternative living arrangements  PATIENT/FAMILY INVOLVEMENT: This treatment plan has been presented to and reviewed with the patient, Fred MossesJohn Younes Jr.  The patient and family have been given the opportunity to ask questions and make suggestions.  Carleene OverlieMiddleton, Will Heinkel P, RN 03/03/2016, 10:24 PM
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
## 2016-03-03 NOTE — [facility] Group Notes (Signed)
Patient attend group. 

## 2016-03-03 NOTE — H&P (Signed)
Behavioral Health Medical Screening Exam  Fred MossesJohn Felicetti Jr. is an 30 y.o. male.  Total Time spent with patient: 30 minutes  Psychiatric Specialty Exam: Physical Exam  Constitutional: He appears well-developed.  HENT:  Head: Normocephalic.  Eyes: Pupils are equal, round, and reactive to light.  Respiratory: Effort normal.  Skin: Skin is warm and dry.  Psychiatric: His speech is normal. Judgment normal. His mood appears anxious. He is withdrawn. Cognition and memory are normal. He exhibits a depressed mood. He expresses homicidal and suicidal ideation. He expresses suicidal plans and homicidal plans.    Review of Systems  Psychiatric/Behavioral: Positive for depression, substance abuse and suicidal ideas. The patient is nervous/anxious and has insomnia.   All other systems reviewed and are negative.   There were no vitals taken for this visit.There is no height or weight on file to calculate BMI.  General Appearance: Disheveled  Eye Contact:  Good  Speech:  Clear and Coherent  Volume:  Normal  Mood:  Anxious, Depressed and Hopeless  Affect:  Congruent  Thought Process:  Goal Directed  Orientation:  Full (Time, Place, and Person)  Thought Content:  Negative  Suicidal Thoughts:  Yes.  with intent/plan  Homicidal Thoughts:  No  Memory:  Immediate;   Good  Judgement:  Poor  Insight:  Lacking  Psychomotor Activity:  Negative  Concentration: Concentration: Good  Recall:  Fair  Fund of Knowledge:Fair  Language: Good  Akathisia:  Negative  Handed:  Right  AIMS (if indicated):     Assets:  Desire for Improvement  Sleep:       Musculoskeletal: Strength & Muscle Tone: within normal limits Gait & Station: normal Patient leans: N/A  There were no vitals taken for this visit.  Recommendations:  Based on my evaluation the patient does not appear to have an emergency medical condition. [facility] , then eventual admission for substance  Induced mood disorder, bipolar 2 D/o, medical non compliance and PTSD.  Willa Brocks E, PA-C 03/03/2016, 3:47 AM

## 2016-03-03 NOTE — [facility] Group Notes (Signed)
Patient attend group NA 

## 2016-03-03 NOTE — ED Notes (Signed)
Pt changed into scrubs and wanded by security  

## 2016-03-03 NOTE — BH Assessment (Signed)
[facility] Assessment Progress Note  Per Thedore MinsMojeed Akintayo, MD, this pt requires psychiatric hospitalization at this time.  Lillia AbedLindsay, RN, [facility] Rm [facility].  Pt has signed Voluntary Admission and Consent for Treatment, as well as Consent to Release Information to no one, and signed forms have [facility].  Pt's nurse, Rudean HittDawnaly, has been notified, and agrees to send original paperwork along with pt via [facility] [place], and to call report to [phone].  Doylene Canninghomas Aubrie Lucien, MA Triage Specialist [phone]

## 2016-03-04 DIAGNOSIS — Z818 Family history of other mental and behavioral disorders: Secondary | ICD-10-CM

## 2016-03-04 DIAGNOSIS — Z91030 Bee allergy status: Secondary | ICD-10-CM

## 2016-03-04 DIAGNOSIS — Z888 Allergy status to other drugs, medicaments and biological substances status: Secondary | ICD-10-CM

## 2016-03-04 MED ORDER — CLONIDINE HCL 0.1 MG PO TABS
0.1000 mg | ORAL_TABLET | Freq: Every day | ORAL | Status: AC
  Administered 2016-03-08 – 2016-03-09 (×2): 0.1 mg via ORAL
  Filled 2016-03-04 (×2): qty 1

## 2016-03-04 MED ORDER — CLONIDINE HCL 0.1 MG PO TABS
0.1000 mg | ORAL_TABLET | ORAL | Status: AC
  Administered 2016-03-06 – 2016-03-07 (×4): 0.1 mg via ORAL
  Filled 2016-03-04 (×4): qty 1

## 2016-03-04 MED ORDER — ONDANSETRON 4 MG PO TBDP
4.0000 mg | ORAL_TABLET | Freq: Four times a day (QID) | ORAL | Status: AC | PRN
  Administered 2016-03-04: 4 mg via ORAL
  Filled 2016-03-04: qty 1

## 2016-03-04 MED ORDER — CLONIDINE HCL 0.1 MG PO TABS
0.1000 mg | ORAL_TABLET | Freq: Four times a day (QID) | ORAL | Status: AC
  Administered 2016-03-04 – 2016-03-05 (×6): 0.1 mg via ORAL
  Filled 2016-03-04 (×8): qty 1

## 2016-03-04 MED ORDER — NAPROXEN 500 MG PO TABS
500.0000 mg | ORAL_TABLET | Freq: Two times a day (BID) | ORAL | Status: AC | PRN
  Administered 2016-03-05: 500 mg via ORAL
  Filled 2016-03-04: qty 1

## 2016-03-04 MED ORDER — LOPERAMIDE HCL 2 MG PO CAPS
2.0000 mg | ORAL_CAPSULE | ORAL | Status: AC | PRN
  Administered 2016-03-04: 4 mg via ORAL
  Administered 2016-03-04 – 2016-03-05 (×2): 2 mg via ORAL
  Filled 2016-03-04: qty 1
  Filled 2016-03-04: qty 2
  Filled 2016-03-04: qty 1

## 2016-03-04 MED ORDER — HYDROXYZINE HCL 25 MG PO TABS
25.0000 mg | ORAL_TABLET | Freq: Four times a day (QID) | ORAL | Status: DC | PRN
  Administered 2016-03-04 – 2016-03-06 (×6): 25 mg via ORAL
  Filled 2016-03-04 (×6): qty 1

## 2016-03-04 MED ORDER — METHOCARBAMOL 500 MG PO TABS: 500.0000 mg | ORAL_TABLET | Freq: Three times a day (TID) | ORAL | Status: AC | PRN

## 2016-03-04 MED ORDER — DICYCLOMINE HCL 20 MG PO TABS
20.0000 mg | ORAL_TABLET | Freq: Four times a day (QID) | ORAL | Status: AC | PRN
  Administered 2016-03-04 – 2016-03-08 (×3): 20 mg via ORAL
  Filled 2016-03-04 (×3): qty 1

## 2016-03-04 NOTE — Progress Notes (Signed)
Patient ID: Fred MossesJohn Gladson Jr., male   DOB: April 02, 1986, 30 y.o.   MRN: [id number]  DAR: Pt. Denies HI and A/V Hallucinations. He reports SI with no current plan. He is able to contract for safety. He reports sleep is fair, appetite is fair, energy level is low, and concentration is poor. He rates depression, hopelessness, and axniety He reports withdrawal symptoms including tremors, cravings, agitation, irritability, cramping, and tremors. Patient is receiving both PRN and scheduled medication for withdrawal and its symptoms. Support and encouragement provided to the patient. Scheduled medications administered to patient per physician's orders. Patient is minimal but cooperative. He is seen mostly laying in bed throughout the morning, predominantly related to his withdrawal. However, as the day progresses he is more visible in the milieu. Q15 minute checks are maintained for safety.
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
## 2016-03-04 NOTE — Progress Notes (Signed)
Patient attended NA group meeting. 

## 2016-03-04 NOTE — Progress Notes (Signed)
Pt has been visible on the unit, in the dayroom talking with peers and watching TV.  Pt reports that he continues to have thoughts to harm himself, but can contract for safety with staff.  He denies HI/AVH.  He also reports that he is still having withdrawal symptoms.  He is utilizing his prn medications with the clonidine protocol.  Pt's BP is trending low this evening, so he was given gatorade to help increase his BP.  He has been appropriate and cooperative.  He attended evening group.  Support and encouragement offered.  Discharge plans are in process.  Pt makes his needs known to staff.  Safety maintained with q15 minute checks.

## 2016-03-04 NOTE — [facility] Suicide Risk Assessment (Signed)
[facility] INPATIENT:  Family/Significant Other Suicide Prevention Education  Suicide Prevention Education:  Patient Refusal for Family/Significant Other Suicide Prevention Education: The patient Fred MossesJohn Boulet Jr. has refused to provide written consent for family/significant other to be provided Family/Significant Other Suicide Prevention Education during admission and/or prior to discharge.  Physician notified. SPE reviewed with patient and brochure provided. Patient encouraged to return to hospital if having suicidal thoughts, patient verbalized his/her understanding and has no further questions at this time.   Fred Reynolds 03/04/2016, 3:04 PM
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
## 2016-03-05 LAB — LIPID PANEL
CHOLESTEROL: 152 mg/dL (ref 0–200)
HDL: 63 mg/dL (ref >40)
LDL Cholesterol: 62 mg/dL (ref 0–99)
TRIGLYCERIDES: 136 mg/dL (ref <150)
Total CHOL/HDL Ratio: 2.4 RATIO
VLDL: 27 mg/dL (ref 0–40)

## 2016-03-05 LAB — HIV ANTIBODY (ROUTINE TESTING W REFLEX): HIV SCREEN 4TH GENERATION: NONREACTIVE

## 2016-03-05 LAB — HEPATITIS C ANTIBODY: HCV Ab: <0.1 s/co ratio (ref 0.0–0.9)

## 2016-03-05 MED ORDER — QUETIAPINE FUMARATE 25 MG PO TABS
25.0000 mg | ORAL_TABLET | Freq: Two times a day (BID) | ORAL | Status: DC
  Administered 2016-03-05 – 2016-03-06 (×3): 25 mg via ORAL
  Filled 2016-03-05 (×9): qty 1

## 2016-03-05 MED ORDER — ALUM & MAG HYDROXIDE-SIMETH 200-200-20 MG/5ML PO SUSP
30.0000 mL | ORAL | Status: DC | PRN
  Administered 2016-03-05 – 2016-03-06 (×2): 30 mL via ORAL
  Filled 2016-03-05 (×2): qty 30

## 2016-03-05 NOTE — Progress Notes (Signed)
Progress note  S: Patient complains of chest pain which she states is associated with "aggravation." He states it feels like a heavy pressure on his chest. He also reports that he is having some mood lability and was previously taking 25 mg of Seroquel a couple of times during the day.  O: Well-developed well-nourished African-American man lying quietly on bed in no apparent distress somewhat irritable but relatively pleasant and cooperative no current acute suicidal or homicidal ideation plan or intent no psychotic symptoms no mania  A/P will check EKG to rule out any cardiac issues. Patient will resume Seroquel 25 mg by mouth every morning and every noon for better mood control.  Acquanetta SitElizabeth Woods Sofie Schendel, MD

## 2016-03-05 NOTE — [facility] Group Notes (Signed)
[facility] Group Notes:  (Nursing/MHT/Case Management/Adjunct)  Date:  03/05/2016  Time:  10:01 AM  Type of Therapy:  Psychoeducational Skills  Participation Level:  Did Not Attend  Participation Quality:  N/A  Affect:  N./A  Cognitive:  N/A  Insight:  None  Engagement in Group:  None  Modes of Intervention:  Discussion and Education  Summary of Progress/Problems: Patient did not attend group.  Anja Neuzil E 03/05/2016, 10:01 AM 

## 2016-03-05 NOTE — [facility] Group Notes (Signed)
[facility] LCSW Group Therapy 03/05/2016  1:15 PM   Type of Therapy: Group Therapy  Participation Level: Did Not Attend. Patient invited to participate but declined.   Mazzy Santarelli, MSW, LCSW Clinical Social Worker [facility] [phone]   

## 2016-03-05 NOTE — Progress Notes (Signed)
[facility]  03/05/2016 9:28 AM  Patient Active Problem List   Diagnosis Date Noted  . Bipolar affective disorder, current episode mixed (HCC) 03/03/2016  . Obesity 07/15/2015  . Hip pain 07/15/2015  . Moderate benzodiazepine use disorder 06/23/2015  . Opioid use disorder, moderate, in sustained remission 06/23/2015  . Cocaine use disorder, moderate, in sustained remission 06/23/2015  . Hyperprolactinemia (HCC) 06/23/2015  . Benzodiazepine abuse 06/22/2015  . Bipolar disorder, curr episode mixed, severe, w/o psychotic features (HCC) 04/20/2015  . PTSD (post-traumatic stress disorder) 04/20/2015  . Cannabis use disorder, severe, dependence (HCC) 04/20/2015  . Tobacco use disorder 04/20/2015    Diagnosis: Opioid use disorder severe, Alcohol use disorder severe, Bipolar 1 disorder by history  Subjective: [name] expresses he is agitated this morning. Associates that he was rushed through his morning meal and a needle stick for lab work as his source of agitation. He also has feeling of depression and wanting to lay in bed. He did not sleep well last night, awaking multiple times. He is having abdominal cramps and states that the bentyl does not help.  Diarrhea is well controlled. Having restless legs, watery eyes, and sweats. Denies any nausea. Denies any visual or auditory hallucinations. Has some thoughts of self harms but no plan or intent. Relates the harmful thoughts with his agitation from this morning events. No homicidal ideations, plan, or intent.   Objective: Well nourished and well developed male appears to be agitated. His mood and affect are congruent described as agitated. No eye contact  during encounter. Speech is regular rate with no slurring. No tremors, abnormal gait, ataxia, or nystagmus. Has thoughts of  "I don't want to live", but has no plan or intent. Thoughts are not constant and has them intermittently as his agitation increases.  Denies any homicidal  ideation, plan, or intent. Thought process is linear and goal directed. Insight and judgement is good.     Current Facility-Administered Medications (Cardiovascular):  .  cloNIDine (CATAPRES) tablet 0.1 mg **FOLLOWED BY** [START ON 03/06/2016] cloNIDine (CATAPRES) tablet 0.1 mg **FOLLOWED BY** [START ON 03/08/2016] cloNIDine (CATAPRES) tablet 0.1 mg     Current Facility-Administered Medications (Analgesics):  .  ibuprofen (ADVIL,MOTRIN) tablet 600 mg .  naproxen (NAPROSYN) tablet 500 mg     Current Facility-Administered Medications (Other):  .  citalopram (CELEXA) tablet 20 mg .  dicyclomine (BENTYL) tablet 20 mg .  divalproex (DEPAKOTE ER) 24 hr tablet 750 mg .  hydrOXYzine (ATARAX/VISTARIL) tablet 25 mg .  loperamide (IMODIUM) capsule 2-4 mg .  magnesium hydroxide (MILK OF MAGNESIA) suspension 30 mL .  methocarbamol (ROBAXIN) tablet 500 mg .  nicotine (NICODERM CQ - dosed in mg/24 hours) patch 21 mg .  ondansetron (ZOFRAN-ODT) disintegrating tablet 4 mg .  QUEtiapine (SEROQUEL XR) 24 hr tablet 200 mg  No current outpatient prescriptions on file.  Vital Signs:Blood pressure 106/60, pulse 69, temperature 98.2 F (36.8 C), temperature source Oral, resp. rate 18, height 5' 5.5" (1.664 m), weight 111.9 kg (246 lb 12 oz).    Lab Results:  Results for orders placed or performed during the hospital encounter of 03/03/16 (from the past 48 hour(s))  HIV antibody     Status: None   Collection Time: 03/04/16  6:23 PM  Result Value Ref Range   HIV Screen 4th Generation wRfx Non Reactive Non Reactive    Comment: (NOTE) Performed At: [facility] [address] [place], [place] [id number] Mila HomerHancock William F MD YQ:[id number]:[phone] Performed  at [facility]   Hepatitis C antibody     Status: None   Collection Time: 03/04/16  6:23 PM  Result Value Ref Range   HCV Ab <0.1 0.0 - 0.9 s/co ratio    Comment: (NOTE)                                  Negative:     <  0.8                             Indeterminate: 0.8 - 0.9                                  Positive:     > 0.9 The CDC recommends that a positive HCV antibody result be followed up with a HCV Nucleic Acid Amplification test (161096). Performed At: [facility] [address] [place], [place] [id number] Mila Homer MD BJ:[id number] Performed at [facility]     Physical Findings: AIMS: Facial and Oral Movements Muscles of Facial Expression: None, normal Lips and Perioral Area: None, normal Jaw: None, normal Tongue: None, normal,Extremity Movements Upper (arms, wrists, hands, fingers): None, normal Lower (legs, knees, ankles, toes): None, normal, Trunk Movements Neck, shoulders, hips: None, normal, Overall Severity Severity of abnormal movements (highest score from questions above): None, normal Incapacitation due to abnormal movements: None, normal Patient's awareness of abnormal movements (rate only patient's report): No Awareness, Dental Status Current problems with teeth and/or dentures?: No Does patient usually wear dentures?: No  CIWA:    COWS:  COWS Total Score: 5   Assessment/Plan: Patient is day 2 of detox. He is having some withdrawal symptoms and agitation. Continue current treatment and monitor through out the day as this maybe related to his detox process.   Debbra Riding, Student-PA 03/05/2016, 9:28 AM   Mr. [name] has been a patient here before and appears to be returning to his baseline. He was questioned today about how long he would want to stay in the hospital and what his goals for hospitalization were and he stated he would like to become a normally functioning person, that is someone who doesn't need to do drugs. We discussed that this is a long-term goal but I encouraged him to identify a short term goals such as the length of hospitalization that he felt would be effective to enable him to gain more control over his life and  situation.  Acquanetta Sit, MD

## 2016-03-05 NOTE — Tx Team (Signed)
Interdisciplinary Treatment and Diagnostic Plan Update  03/05/2016 Time of Session: 9:30am Fred Reynolds. MRN: [id number]  Principal Diagnosis:  Opioid use disorder, moderate, in sustained remission [F11.90]  Cocaine use disorder, moderate, in sustained remission [F14.90]  Benzodiazepine abuse [F13.10]    Secondary Diagnoses: Active Problems:   Bipolar affective disorder, current episode mixed (HCC)   Current Medications:  Current Facility-Administered Medications  Medication Dose Route Frequency Provider Last Rate Last Dose  . citalopram (CELEXA) tablet 20 mg  20 mg Oral Daily Charm Rings, NP   20 mg at 03/05/16 0816  . cloNIDine (CATAPRES) tablet 0.1 mg  0.1 mg Oral QID Acquanetta Sit, MD   0.1 mg at 03/05/16 0816   Followed by  . [START ON 03/06/2016] cloNIDine (CATAPRES) tablet 0.1 mg  0.1 mg Oral BH-qamhs Acquanetta Sit, MD       Followed by  . [START ON 03/08/2016] cloNIDine (CATAPRES) tablet 0.1 mg  0.1 mg Oral QAC breakfast Acquanetta Sit, MD      . dicyclomine (BENTYL) tablet 20 mg  20 mg Oral Q6H PRN Acquanetta Sit, MD   20 mg at 03/04/16 1031  . divalproex (DEPAKOTE ER) 24 hr tablet 750 mg  750 mg Oral Daily Charm Rings, NP   750 mg at 03/04/16 2227  . hydrOXYzine (ATARAX/VISTARIL) tablet 25 mg  25 mg Oral Q6H PRN Acquanetta Sit, MD   25 mg at 03/05/16 0630  . ibuprofen (ADVIL,MOTRIN) tablet 600 mg  600 mg Oral Q8H PRN Charm Rings, NP      . loperamide (IMODIUM) capsule 2-4 mg  2-4 mg Oral PRN Acquanetta Sit, MD   2 mg at 03/04/16 1216  . magnesium hydroxide (MILK OF MAGNESIA) suspension 30 mL  30 mL Oral Daily PRN Charm Rings, NP      . methocarbamol (ROBAXIN) tablet 500 mg  500 mg Oral Q8H PRN Acquanetta Sit, MD      . naproxen (NAPROSYN) tablet 500 mg  500 mg Oral BID PRN Acquanetta Sit, MD      . nicotine (NICODERM CQ - dosed in mg/24 hours) patch 21 mg  21 mg Transdermal Daily Kerry Hough, PA-C    21 mg at 03/05/16 0817  . ondansetron (ZOFRAN-ODT) disintegrating tablet 4 mg  4 mg Oral Q6H PRN Acquanetta Sit, MD   4 mg at 03/04/16 1031  . QUEtiapine (SEROQUEL XR) 24 hr tablet 200 mg  200 mg Oral QHS Charm Rings, NP   200 mg at 03/04/16 2227   PTA Medications: Prescriptions Prior to Admission  Medication Sig Dispense Refill Last Dose  . citalopram (CELEXA) 20 MG tablet Take 1 tablet (20 mg total) by mouth daily. 30 tablet 0 APRIL OR MAY  . clonazePAM (KLONOPIN) 1 MG tablet Take 0.5 tablets (0.5 mg total) by mouth at bedtime. 30 tablet 0 APRIL OR MAY  . divalproex (DEPAKOTE) 250 MG DR tablet Take 3 tablets (750 mg total) by mouth at bedtime. 90 tablet 0 APRIL OR MAY  . ibuprofen (ADVIL,MOTRIN) 200 MG tablet Take 400-600 mg by mouth every 6 (six) hours as needed (for pain).   Past Month at Unknown time  . QUEtiapine (SEROQUEL) 200 MG tablet Take 1 tablet (200 mg total) by mouth at bedtime. 30 tablet 0 APRIL OR MAY    Patient Stressors: Marital or family conflict Medication change or noncompliance Substance abuse  Patient Strengths: Motivation for treatment/growth Physical Health Supportive  family/friends  Treatment Modalities: Medication Management, Group therapy, Case management,  1 to 1 session with clinician, Psychoeducation, Recreational therapy.   Physician Treatment Plan for Primary Diagnosis:  Opioid use disorder, moderate, in sustained remission [F11.90]  Cocaine use disorder, moderate, in sustained remission [F14.90]  Benzodiazepine abuse [F13.10]   Long Term Goal(s): Improvement in symptoms so as ready for discharge Improvement in symptoms so as ready for discharge   Short Term Goals: Ability to disclose and discuss suicidal ideas Ability to identify and develop effective coping behaviors will improve Compliance with prescribed medications will improve Ability to identify changes in lifestyle to reduce recurrence of condition will improve Ability to  verbalize feelings will improve Ability to disclose and discuss suicidal ideas Ability to demonstrate self-control will improve Ability to identify and develop effective coping behaviors will improve Compliance with prescribed medications will improve  Medication Management: Evaluate patient's response, side effects, and tolerance of medication regimen.  Therapeutic Interventions: 1 to 1 sessions, Unit Group sessions and Medication administration.  Evaluation of Outcomes: Progressing  Physician Treatment Plan for Secondary Diagnosis: Active Problems:   Bipolar affective disorder, current episode mixed (HCC)  Long Term Goal(s): Improvement in symptoms so as ready for discharge Improvement in symptoms so as ready for discharge   Short Term Goals: Ability to disclose and discuss suicidal ideas Ability to identify and develop effective coping behaviors will improve Compliance with prescribed medications will improve Ability to identify changes in lifestyle to reduce recurrence of condition will improve Ability to verbalize feelings will improve Ability to disclose and discuss suicidal ideas Ability to demonstrate self-control will improve Ability to identify and develop effective coping behaviors will improve Compliance with prescribed medications will improve     Medication Management: Evaluate patient's response, side effects, and tolerance of medication regimen.  Therapeutic Interventions: 1 to 1 sessions, Unit Group sessions and Medication administration.  Evaluation of Outcomes: Progressing   RN Treatment Plan for Primary Diagnosis:  Opioid use disorder, moderate, in sustained remission [F11.90]  Cocaine use disorder, moderate, in sustained remission [F14.90]  Benzodiazepine abuse [F13.10]   Long Term Goal(s): Knowledge of disease and therapeutic regimen to maintain health will improve  Short Term Goals: Ability to remain free from injury will improve, Ability to disclose  and discuss suicidal ideas, Ability to identify and develop effective coping behaviors will improve and Compliance with prescribed medications will improve  Medication Management: RN will administer medications as ordered by provider, will assess and evaluate patient's response and provide education to patient for prescribed medication. RN will report any adverse and/or side effects to prescribing provider.  Therapeutic Interventions: 1 on 1 counseling sessions, Psychoeducation, Medication administration, Evaluate responses to treatment, Monitor vital signs and CBGs as ordered, Perform/monitor CIWA, COWS, AIMS and Fall Risk screenings as ordered, Perform wound care treatments as ordered.  Evaluation of Outcomes: Progressing   LCSW Treatment Plan for Primary Diagnosis:  Opioid use disorder, moderate, in sustained remission [F11.90]  Cocaine use disorder, moderate, in sustained remission [F14.90]  Benzodiazepine abuse [F13.10]   Long Term Goal(s): Safe transition to appropriate next level of care at discharge, Engage patient in therapeutic group addressing interpersonal concerns.  Short Term Goals: Engage patient in aftercare planning with referrals and resources, Increase social support, Increase emotional regulation, Identify triggers associated with mental health/substance abuse issues and Increase skills for wellness and recovery  Therapeutic Interventions: Assess for all discharge needs, 1 to 1 time with Social worker, Explore available resources and support systems, Assess for adequacy  in community support network, Educate family and significant other(s) on suicide prevention, Complete Psychosocial Assessment, Interpersonal group therapy.  Evaluation of Outcomes: Progressing   Progress in Treatment :  Attending groups: Continuing to assess  Participating in groups: Continuing to assess  Taking medication as prescribed: Yes, MD continuing to assess for appropriate medication  regimen  Toleration medication: Yes  Family/Significant other contact made: No, patient has declined for collateral contact   Patient understands diagnosis: Yes  Discussing patient identified problems/goals with staff: Yes  Medical problems stabilized or resolved: Yes  Denies suicidal/homicidal ideation: Patient endorses passive SI today  Issues/concerns per patient self-inventory: None reported  Other: N/A  New problem(s) identified: None reported at this time    New Short Term/Long Term Goal(s): None at this time    Discharge Plan or Barriers: Treatment team continuing to assess.    Reason for Continuation of Hospitalization: Anxiety Depression Medication stabilization Suicidal Ideations Withdrawal symptoms  Estimated Length of Stay: 2-3 days    Attendees:  Patient:  Physician: Dr. Mckinley Jewel, MD 03/05/2016 9:30am  Nursing: Marzetta Board,  RN 03/05/2016 9:30am  RN Care Manager: Onnie Boer, CM 03/05/2016 9:30am  Social Workers:  Samuella Bruin, LCSW, 03/05/2016 9:30am  Nurse Pratictioners: Serena Colonel, NP 03/05/16  Scribe for Treatment Team: Samuella Bruin, LCSW Clinical Social Worker [facility] [phone]

## 2016-03-05 NOTE — Progress Notes (Addendum)
Patient ID: Lafonda MossesJohn Werth Jr., male   DOB: June 28, 1985, 30 y.o.   MRN: [id number]  DAR: Pt. Denies HI and A/V Hallucinations. He reports passive SI but is able to contract for safety. He reports, "It's a bad morning" when writer first interacts with patient at the medication window this morning. He reports sleep is fair, appetite is food, energy level is low, and concentration is poor. He rates depression 8/10, hopelessness 8/10, and anxiety 10/10. Patient does not report any pain this morning however reports later in the day that he is experiencing pressure in his chest. Patient's vitals WDL. Patient given Vistaril and Ibuprofen. Patient reports pain 10/10, unrelenting however is laying comfortably with no apparent distress in his bed. He is seen later in the milieu laughing with an MHT. Patient then rates his pain 7/10. He is provided with Maalox due to possible indigestion. EKG was obtained as well and placed on patient's chart. He reported, "I can't breathe." Patient was instructed to deep breath as Clinical research associatewriter provided an example. Patient was taking shallow breaths against writer's instruction. However, while writer distracted patient by talking, his respirations were even and unlabored. Support and encouragement provided to the patient. Scheduled and PRN medications administered to patient to decrease anxiety, irritability, and withdrawal symptoms. Q15 minute checks are maintained for safety.

## 2016-03-06 MED ORDER — HYDROXYZINE HCL 50 MG PO TABS
50.0000 mg | ORAL_TABLET | Freq: Four times a day (QID) | ORAL | Status: AC | PRN
  Administered 2016-03-06 – 2016-03-08 (×6): 50 mg via ORAL
  Filled 2016-03-06 (×6): qty 1

## 2016-03-06 MED ORDER — CITALOPRAM HYDROBROMIDE 10 MG PO TABS
30.0000 mg | ORAL_TABLET | Freq: Every day | ORAL | Status: DC
  Administered 2016-03-07 – 2016-03-11 (×5): 30 mg via ORAL
  Filled 2016-03-06: qty 3
  Filled 2016-03-06: qty 63
  Filled 2016-03-06 (×5): qty 3

## 2016-03-06 MED ORDER — QUETIAPINE FUMARATE 25 MG PO TABS
25.0000 mg | ORAL_TABLET | Freq: Three times a day (TID) | ORAL | Status: DC
  Administered 2016-03-06 – 2016-03-11 (×15): 25 mg via ORAL
  Filled 2016-03-06 (×17): qty 1

## 2016-03-06 NOTE — Progress Notes (Signed)
Patient ID: Fred MossesJohn Mastandrea Reynolds., male   DOB: Jul 29, 1985, 30 y.o.   MRN: [id number]  Pt currently presents with a flat affect and evasive behavior. Pt reports to writer that their goal is to "get some sleep." Pt states "they won't give me my Klonopin, I'm sure that's why my blood pressure is high. When I leave here I'm just going to find someone else to prescribe it to me." Pt reports poor sleep with current medication regimen. Interaction with patient superficial. States "I'm hoping that I can stay here until the middle of next week."  Pt provided with medications per providers orders. Pt's labs and vitals were monitored throughout the night. Pt supported emotionally and encouraged to express concerns and questions. Pt educated on medications. Pt seen interacting with other patients pleasantly on the unit.   Pt's safety ensured with 15 minute and environmental checks. Pt currently denies SI/HI and A/V hallucinations. Pt verbally agrees to seek staff if SI/HI or A/VH occurs and to consult with staff before acting on any harmful thoughts. Will continue POC.

## 2016-03-06 NOTE — Progress Notes (Signed)
[facility]  03/06/2016 9:11 AM  Patient Active Problem List   Diagnosis Date Noted  . Dysthymic disorder 03/05/2016  . Bipolar affective disorder, current episode mixed (HCC) 03/03/2016  . Obesity 07/15/2015  . Hip pain 07/15/2015  . Moderate benzodiazepine use disorder 06/23/2015  . Opioid use disorder, moderate, in sustained remission 06/23/2015  . Cocaine use disorder, moderate, in sustained remission 06/23/2015  . Hyperprolactinemia (HCC) 06/23/2015  . Benzodiazepine abuse 06/22/2015  . Bipolar disorder, curr episode mixed, severe, w/o psychotic features (HCC) 04/20/2015  . PTSD (post-traumatic stress disorder) 04/20/2015  . Opioid type dependence, continuous (HCC) 04/20/2015  . Cannabis use disorder, severe, dependence (HCC) 04/20/2015  . Tobacco use disorder 04/20/2015    Diagnosis: Opioid use disorder severe, Alcohol use disorder severe, Bipolar 1 disorder by history  Subjective: [name] endorses that he is feeling much better, but having some anxiety. He associates shortness of breath, nightmares, palpitations, and restless nights with his anxiety. He says that vistaril does not help his anxiety, but Xanax or klonopine has in the past. Denies any depression or paranoia symptoms. Withdrawal symptoms of diarrhea, abdominal cramping, and nausea have resolved. He is experiencing mild tremors in his hands. 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 He participated in [employer] last night and states he enjoyed it. Denies visual or auditory hallucinations. Denies suicidal or homicidal ideation, plan, or intent.   Objective: Well nourished and well developed male appear more alert and pleasant than yesterday. His mood and affect are congruent having more positive attitude. Speech is regular rate with no slurring. Has mild tremors in hands. No abnormal gait, ataxia, or nystagmus. No suicidal ideation, plan, or  intent.  Denies any homicidal ideation, plan, or intent. Thought process is linear and goal directed. Insight and judgement is good.     Current Facility-Administered Medications (Cardiovascular):  .  [EXPIRED] cloNIDine (CATAPRES) tablet 0.1 mg **FOLLOWED BY** cloNIDine (CATAPRES) tablet 0.1 mg **FOLLOWED BY** [START ON 03/08/2016] cloNIDine (CATAPRES) tablet 0.1 mg     Current Facility-Administered Medications (Analgesics):  .  ibuprofen (ADVIL,MOTRIN) tablet 600 mg .  naproxen (NAPROSYN) tablet 500 mg     Current Facility-Administered Medications (Other):  .  alum & mag hydroxide-simeth (MAALOX/MYLANTA) 200-200-20 MG/5ML suspension 30 mL .  citalopram (CELEXA) tablet 20 mg .  dicyclomine (BENTYL) tablet 20 mg .  divalproex (DEPAKOTE ER) 24 hr tablet 750 mg .  hydrOXYzine (ATARAX/VISTARIL) tablet 25 mg .  loperamide (IMODIUM) capsule 2-4 mg .  magnesium hydroxide (MILK OF MAGNESIA) suspension 30 mL .  methocarbamol (ROBAXIN) tablet 500 mg .  nicotine (NICODERM CQ - dosed in mg/24 hours) patch 21 mg .  ondansetron (ZOFRAN-ODT) disintegrating tablet 4 mg .  QUEtiapine (SEROQUEL XR) 24 hr tablet 200 mg .  QUEtiapine (SEROQUEL) tablet 25 mg  No current outpatient prescriptions on file.  Vital Signs:Blood pressure 116/65, pulse 82, temperature 97.9 F (36.6 C), temperature source Oral, resp. rate 16, height 5' 5.5" (1.664 m), weight 111.9 kg (246 lb 12 oz), SpO2 100 %.    Lab Results:  Results for orders placed or performed during the hospital encounter of 03/03/16 (from the past 48 hour(s))  HIV antibody     Status: None   Collection Time: 03/04/16  6:23 PM  Result Value Ref Range   HIV Screen 4th Generation wRfx Non Reactive Non Reactive    Comment: (NOTE) Performed At: [facility]  [facility] [address] [place], [place] [id number] Mila Homer MD WU:[id number] Performed at [facility]   Hepatitis C antibody     Status: None   Collection Time:  03/04/16  6:23 PM  Result Value Ref Range   HCV Ab <0.1 0.0 - 0.9 s/co ratio    Comment: (NOTE)                                  Negative:     < 0.8                             Indeterminate: 0.8 - 0.9                                  Positive:     > 0.9 The CDC recommends that a positive HCV antibody result be followed up with a HCV Nucleic Acid Amplification test (956213). Performed At: [facility] [address] [place], [place] [id number] Mila Homer MD [id number] Performed at [facility]   Lipid panel     Status: None   Collection Time: 03/05/16  6:48 AM  Result Value Ref Range   Cholesterol 152 0 - 200 mg/dL   Triglycerides 010 <272 mg/dL   HDL 63 >53 mg/dL   Total CHOL/HDL Ratio 2.4 RATIO   VLDL 27 0 - 40 mg/dL   LDL Cholesterol 62 0 - 99 mg/dL    Comment:        Total Cholesterol/HDL:CHD Risk Coronary Heart Disease Risk Table                     Men   Women  1/2 Average Risk   3.4   3.3  Average Risk       5.0   4.4  2 X Average Risk   9.6   7.1  3 X Average Risk  23.4   11.0        Use the calculated Patient Ratio above and the CHD Risk Table to determine the patient's CHD Risk.        ATP III CLASSIFICATION (LDL):  <100     mg/dL   Optimal  664-403  mg/dL   Near or Above                    Optimal  130-159  mg/dL   Borderline  474-259  mg/dL   High  >563     mg/dL   Very High Performed at [facility]     Physical Findings: AIMS: Facial and Oral Movements Muscles of Facial Expression: None, normal Lips and Perioral Area: None, normal Jaw: None, normal Tongue: None, normal,Extremity Movements Upper (arms, wrists, hands, fingers): None, normal Lower (legs, knees, ankles, toes): None, normal, Trunk Movements Neck, shoulders, hips: None, normal, Overall Severity Severity of abnormal movements (highest score from questions above): None, normal Incapacitation due to abnormal movements: None, normal Patient's  awareness of abnormal movements (rate only patient's report): No Awareness, Dental Status Current problems with teeth and/or dentures?: No Does patient usually wear dentures?: No  CIWA:  CIWA-Ar Total: 1 COWS:  COWS Total Score: 2   Assessment/Plan: Patient appears to be progressing through his detox well, having mild withdrawal symptoms. Mood is more stable  than yesterday with positive thoughts. Will continue current medications and management. Will discuss with Dr. Mckinley Jewel about increasing vistaril to manage his anxiety.   Debbra Riding, Student-PA 03/06/2016, 9:11 AM   Pt seen, case reviewed pt is tending to seek Xanax, but this medication is not indicated at this time due to clinical presentation. Will increase vistaril to 50 mg dose. Pt at times endorses passive Si, but overall appears without plan or intent to harm self/others currently.

## 2016-03-06 NOTE — Plan of Care (Signed)
Problem: Safety: Goal: Periods of time without injury will increase Outcome: Progressing Pt remains safe on the unit today

## 2016-03-06 NOTE — Progress Notes (Addendum)
Data. Patient denies HI/AVH. Patient does report passive SI, with no definitive plan currently. He is able to verbally contract to stay safe on the unit and to come to staff before acting on any self harm thoughts/feelings.He requested from the MD a change in his anxiety medication, "It isn't working for me". C/O indigestion and received PRN. Patient interacting well with staff and other patients. On his self assessment patient reports 8/10 for depression, 10/10 for hopelessness ans 8-10/10 for anxiety. His goal for today is: "Talk to the Dr. About anxiety and see if she can change my meds". And he is going to, "Try to stay positive today".  Action. Emotional support and encouragement offered. Education provided on medication, indications and side effect. Q 15 minute checks done for safety. Response. Safety on the unit maintained through 15 minute checks.  Medications taken as prescribed. Attended groups. Remained calm and appropriate through out shift.

## 2016-03-06 NOTE — Progress Notes (Signed)
Patient ID: Fred MossesJohn Bruster Jr., male   DOB: 10/12/1985, 30 y.o.   MRN: [id number] D: Client reports anxiety "7" of 10, doing good on my other medicine, but I don't think Vistaril is working" "I was on Xanax at home, been on it since I was in school" "my doctor was giving it to me" Client proceeded to say after he ran out he was getting Xanax off the street. A: Writer provided emotional support, encouraged client to speak with physician about any medication changes. Medications reviewed, administered as ordered. Staff will monitor q915min for safety. R: Client is safe on the unit, attended karaoke.

## 2016-03-06 NOTE — Plan of Care (Signed)
Problem: Education: Goal: Knowledge of the prescribed therapeutic regimen will improve Outcome: Progressing Patient was able to take his medications with minimal prompting this shift.

## 2016-03-06 NOTE — [facility] Group Notes (Signed)
[facility] LCSW Group Therapy 03/06/2016 1:15 PM Type of Therapy: Group Therapy Participation Level: Active  Participation Quality: Attentive, Sharing   Affect: anxious, agitated  Cognitive: Alert and Oriented  Insight: Developing/Improving and Engaged  Engagement in Therapy: Developing/Improving and Engaged  Modes of Intervention: Clarification, Confrontation, Discussion, Education, Exploration, Limit-setting, Orientation, Problem-solving, Rapport Building, Dance movement psychotherapisteality Testing, Socialization and Support  Summary of Progress/Problems: The topic for today was feelings about relapse. Pt discussed what relapse prevention is to them and identified triggers that they are on the path to relapse. Pt processed their feeling towards relapse and was able to relate to peers. Pt discussed coping skills that can be used for relapse prevention. Patient initially appeared agitated and anxious at the beginning of group, citing his frustration with medication concerns to address his high levels of anxiety. He reports that he feels like "breaking something". Patient participated minimally in conversation but shared that he wants to remain stable on medications long term and also learn to love himself and stop trying to please others in order to sustain his recovery.   Samuella BruinKristin Red Mandt, MSW, LCSW Clinical Social Worker [facility] [phone]

## 2016-03-06 NOTE — Plan of Care (Signed)
Problem: Medication: Goal: Compliance with prescribed medication regimen will improve Outcome: Progressing Client is compliant with medication regime AEB taking medications at schedule times, verbalizing concerns "I don't think that Vistaril helping for anxiety" "I take Xanax at home, get a prescription from the doctor, when I run out I buy them on the street"

## 2016-03-06 NOTE — Progress Notes (Signed)
CSW met with patient to discuss discharge plans. He reports feeling "a little better than yesterday." Patient reports interest in residential treatment. He is agreeable to having referrals sent to [facility]. He reports that he would prefer not to return to [facility] as he has to work and does not want to be gone for an extended period of time. Patient identifies his back up plan as returning home to follow up with outpatient services- states that he does not want to return to [facility]. CSW also informed patient about support groups and wellness classes available at [facility].   Tilden Fossa, LCSW Clinical Social Worker [facility] [phone]

## 2016-03-07 DIAGNOSIS — F17210 Nicotine dependence, cigarettes, uncomplicated: Secondary | ICD-10-CM

## 2016-03-07 DIAGNOSIS — Z79899 Other long term (current) drug therapy: Secondary | ICD-10-CM

## 2016-03-07 DIAGNOSIS — Z811 Family history of alcohol abuse and dependence: Secondary | ICD-10-CM

## 2016-03-07 DIAGNOSIS — R45851 Suicidal ideations: Secondary | ICD-10-CM

## 2016-03-07 DIAGNOSIS — F1120 Opioid dependence, uncomplicated: Secondary | ICD-10-CM

## 2016-03-07 MED ORDER — QUETIAPINE FUMARATE 25 MG PO TABS
25.0000 mg | ORAL_TABLET | Freq: Three times a day (TID) | ORAL | Status: DC | PRN
  Administered 2016-03-07 – 2016-03-10 (×2): 25 mg via ORAL
  Filled 2016-03-07: qty 1

## 2016-03-07 NOTE — Progress Notes (Signed)
D: Patient's self inventory sheet: patient has fair sleep, recieved sleep medication.good  Appetite, low energy level, poor concentration. Rated depression 8/10, hopeless 8/10, anxiety 10/10. SI/HI/AVH: Reports SI "maybe can contract for safety". Physical complaints are pain in teeth. Goal is "keeping calm". Plans to work on "someone doing unnecessary actions" and "we need coffee on [address]".   A: Medications administered, assessed medication knowledge and education given on medication regimen.  Emotional support and encouragement given patient. R: Continues to endorse  SI and HI , contracts for safety at the moment. Safety maintained with 15 minute checks.
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
## 2016-03-07 NOTE — Progress Notes (Signed)
[facility] Group Notes:  (Nursing/MHT/Case Management/Adjunct)  Date:  03/07/2016  Time:  2045 Type of Therapy:  wrap up group  Participation Level:  Active  Participation Quality:  Appropriate, Attentive, Sharing and Supportive  Affect:  Appropriate  Cognitive:  Appropriate  Insight:  Improving  Engagement in Group:  Engaged  Modes of Intervention:  Clarification, Education and Support  Summary of Progress/Problems: Pt shared that he is learning that it is ok to talk to people about how you are feeling, specifically, "learning to tell people that I'm pissed off".  Marcille BuffyMcNeil, Racer Quam S 03/07/2016, 9:42 PM

## 2016-03-07 NOTE — [facility] Group Notes (Signed)
[facility] Group Notes:  (Nursing/MHT/Case Management/Adjunct)  Date:  03/07/2016  Time:  10:40 AM  Type of Therapy:  Psychoeducational Skills  Participation Level:  Active  Participation Quality:  Appropriate  Affect:  Appropriate  Cognitive:  Appropriate  Insight:  Appropriate  Engagement in Group:  Engaged  Modes of Intervention:  Discussion  Summary of Progress/Problems: Pt did attend self inventory group, pt reported that he was negative HI, no AH/VH noted. Pt reported that he was positive SI, and that maybe he could contract for safety. Bobbie RN made aware. Pt rated his depression as a 8, and his helplessness/hopelessness as a 8.   Pt also reported that his anxiety was a 10.  Pt reported that he needed something to help with anxiety, patient advised that this writer would let his nurse know.    Jacquelyne BalintForrest, Fred Reynolds 03/07/2016, 10:40 AM

## 2016-03-07 NOTE — Progress Notes (Signed)
Pt asked to speak to nurse, very agitated, states that he has severe anxiety and current medications are not working. Pt reports that he has been using benzodiazepines sold on the street. PRN Seroquel added to scheduled medication. Patient stated that this just makes him sleepy, reminded patient that sleepy is rarely compatible with anxiety.

## 2016-03-07 NOTE — Progress Notes (Signed)
Patient ID: Fred MossesJohn Bartell Jr., male   DOB: [birth year], 30 y.o.   MRN: [id number]   Pt currently presents with a blunted affect and labile, superficial behavior. Pt reports to writer that their goal is to "get more sleep tonight." Pt states "I had a better day." Pt reports intermittent sleep disturbance with current medication regimen.   Pt provided with medications per providers orders. Pt's labs and vitals were monitored throughout the night. Pt supported emotionally and encouraged to express concerns and questions. Pt educated on medications.  Pt's safety ensured with 15 minute and environmental checks. Pt currently denies SI/HI and A/V hallucinations. Pt verbally agrees to seek staff if SI/HI or A/VH occurs and to consult with staff before acting on any harmful thoughts. Will continue POC.

## 2016-03-07 NOTE — [facility] Group Notes (Signed)
Adult Psychoeducational Group Note  Date:  03/07/2016 Time:  2:12 AM  Group Topic/Focus:  Wrap-Up Group:   The focus of this group is to help patients review their daily goal of treatment and discuss progress on daily workbooks.   Participation Level:  Active  Participation Quality:  Appropriate and Attentive  Affect:  Appropriate  Cognitive:  Alert and Appropriate  Insight: Appropriate  Engagement in Group:  Engaged  Modes of Intervention:  Discussion  Additional Comments:  Pt was attentive and appropriate during tonights wrap up group with aa/na.  Bing PlumeScott, Branson Kranz D 03/07/2016, 2:12 AM

## 2016-03-07 NOTE — [facility] Group Notes (Signed)
Adult Therapy Group Note  Date: 03/07/2016 Time:  10:00-11:00AM  Group Topic/Focus: Unhealthy Coping Skills versus Healthy Coping Skills  Building Self Esteem:   The Focus of this group was to assist patients in becoming aware of the differences between healthy and unhealthy coping techniques, as well as how to determine which type they are using.   Reasons for choosing the unhealthy techniques were explored, which helped patients to provide support to each other and to determine that, in fact, they are not alone.  Participation Level:  Active  Participation Quality:  Appropriate, Attentive and Sharing  Affect:  Blunted  Cognitive:  Appropriate  Insight: Appropriate and Improving  Engagement in Group:  Engaged  Modes of Intervention:  Discussion, Exploration and Support  Additional Comments:  The patient expressed that his main unhealthy coping skill is "staying high on heroin or opioid pills."  He engaged in the discussion fully, provided support and ideas to other group members.  Carloyn JaegerMareida J Grossman-Orr 03/07/2016, 12:35 PM

## 2016-03-08 MED ORDER — NICOTINE POLACRILEX 2 MG MT GUM
2.0000 mg | CHEWING_GUM | OROMUCOSAL | Status: DC | PRN
  Administered 2016-03-08 – 2016-03-11 (×11): 2 mg via ORAL
  Filled 2016-03-08 (×2): qty 1

## 2016-03-08 NOTE — Progress Notes (Signed)
Patient did attend the evening speaker AA meeting.  

## 2016-03-08 NOTE — Progress Notes (Signed)
D) Pt. Has been irritable and upset with male peer.  Pt. Overheard saying "just give me gun to shoot myself" when MHT offered coloring sheets.  Pt. Later reported that he is having passive SI, but feels safe while here. A) Support offered. PRN medication given for increased anxiety.  Working on Pharmacologistcoping skills for anxiety.  Utilizing walking away and coloring in hallway alone with some success.  R) Pt. Receptive and verbalized appreciation for care.  Verbally contracts for safety and remains safe at this time.

## 2016-03-08 NOTE — Progress Notes (Signed)
Patient ID: Fred MossesJohn Klinck Jr., male   DOB: June 25, 1985, 30 y.o.   MRN: [id number]  Pt currently presents with an anxious affect and labile behavior. Pt reports to writer that their goal is to "use my coping skills." Pt states "it was a hard day, I got anxious and irritated but I walked into the hallway and colored some pages and I calmed down." Pt reports poor, intermittent sleep with current medication regimen.   Pt provided with medications per providers orders. Pt's labs and vitals were monitored throughout the night. Pt supported emotionally and encouraged to express concerns and questions. Pt educated on medications.  Pt's safety ensured with 15 minute and environmental checks. Pt currently denies SI/HI and A/V hallucinations. Pt verbally agrees to seek staff if SI/HI or A/VH occurs and to consult with staff before acting on any harmful thoughts. Will continue POC.

## 2016-03-08 NOTE — [facility] Group Notes (Signed)
Adult Therapy Group Note  Date: 03/08/2016 Time:  10:00-11:00AM  Group Topic/Focus: Healthy Event organiserupport Systems  Building Self Esteem:   The focus of this group was to assist patients in identifying current healthy supports, as well as how to widen their support systems.  Examples given by various patients were used to emphasize the importance of expanding supports, with an emphasis on the use of AA/NA, problem-specific support groups, doctors, counselors, and self.  Most patients also chose to share the reasons for their current hospitalization, and received much encouragement and support from their fellow patients as they did this.   Participation Level:  Active  Participation Quality:  Attentive and Sharing  Affect:  Blunted and Depressed  Cognitive:  Alert  Insight: Good  Engagement in Group:  Engaged  Modes of Intervention:  Discussion, Exploration and Support  Additional Comments:  The patient expressed that healthy supports currently active include other patients currently in the hospital, his business partners, and he also stated his mother does not "feel" supportive because she is using tough love, but actually is supportive.  He did share the reason for his hospitalization as "being suicidal because I was depressed and self-medicating with drugs."   Lynnell ChadMareida J Grossman-Orr, LCSW 03/08/2016  4:15PM
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
## 2016-03-09 MED ORDER — HYDROXYZINE HCL 50 MG PO TABS
50.0000 mg | ORAL_TABLET | Freq: Four times a day (QID) | ORAL | Status: DC | PRN
  Administered 2016-03-09 – 2016-03-10 (×3): 50 mg via ORAL
  Filled 2016-03-09 (×2): qty 1
  Filled 2016-03-09: qty 10
  Filled 2016-03-09: qty 1

## 2016-03-09 NOTE — Progress Notes (Signed)
Recreation Therapy Notes  Date: 03/09/16  Time: 0930 Location: 300 [address] Dayroom  Group Topic: Stress Management  Goal Area(s) Addresses:  Patient will verbalize importance of using healthy stress management.  Patient will identify positive emotions associated with healthy stress management.   Intervention: Guided Imagery  Activity :  Engineer, technical saleseaceful Place Imagery.  LRT introduced to the technique of guided imagery to patients.  LRT read a script so patients could participate in the technique.  Patients were to follow along as LRT read script.  Education:  Stress Management, Discharge Planning.   Education Outcome: Acknowledges edcuation/In group clarification offered/Needs additional education  Clinical Observations/Feedback: Pt did not attend group.      Caroll RancherMarjette Noelly Lasseigne, LRT/CTRS         Caroll RancherLindsay, Lyndsee Casa A 03/09/2016 11:51 AM

## 2016-03-09 NOTE — Progress Notes (Signed)
Patient ID: Fred MossesJohn Heasley Jr., male   DOB: 1986/05/03, 30 y.o.   MRN: [id number]  D: Patient in dayroom watching TV and interacting with peers. Pt plans after discharge is to continue taking medication. Pt reports tolerating medication well. Pt denies SI/HI/AVH and pain. Pt attended and participated in evening wrap up group. Cooperative with assessment.    A: Medications administered as prescribed. Support and encouragement provided as needed. Pt encouraged to discuss feelings and come to staff with any question or concerns.   R: Patient remains safe and complaint with medications.

## 2016-03-09 NOTE — Progress Notes (Signed)
[facility]  03/09/2016 12:53 PM  Patient Active Problem List   Diagnosis Date Noted  . Dysthymic disorder 03/05/2016  . Bipolar affective disorder, current episode mixed (HCC) 03/03/2016  . Obesity 07/15/2015  . Hip pain 07/15/2015  . Moderate benzodiazepine use disorder (HCC) 06/23/2015  . Opioid use disorder, moderate, in sustained remission (HCC) 06/23/2015  . Cocaine use disorder, moderate, in sustained remission (HCC) 06/23/2015  . Hyperprolactinemia (HCC) 06/23/2015  . Benzodiazepine abuse 06/22/2015  . Bipolar disorder, curr episode mixed, severe, w/o psychotic features (HCC) 04/20/2015  . PTSD (post-traumatic stress disorder) 04/20/2015  . Opioid type dependence, continuous (HCC) 04/20/2015  . Cannabis use disorder, severe, dependence (HCC) 04/20/2015  . Tobacco use disorder 04/20/2015    Diagnosis: Cocaine use disorder severe  Subjective: [name] reports that he had a reasonably good weekend. He states he was irritated by a peer number of occasions but simply walked away. He reports that he hasn't made any significant progress on his longterm issues with self-esteem but he does continue to work on this. Overall he does feel like his mood has improved and he denies current suicidal or homicidal ideation, plan or intent. He states his mood is "pretty good" and "not sad the last 2 or 3 days." He reports that he particularly enjoyed listening to the AA speaker last night. He did appear sleepy today but states he has not been sleepy over the weekend and is not sure if his medications are sedating him.  Objective: Well-nourished man in no apparent distress pleasant and cooperative mood is described as pretty good affect appears mildly anxious speech and motor are within normal limits, thought processes are linear and goal-directed, thought content no suicidal or homicidal ideation, plan or intent currently, and no mania noted, no psychosis. Alert and oriented 3, insight and  judgment are fair, IQ appears within average range.         Current Facility-Administered Medications (Analgesics):  .  ibuprofen (ADVIL,MOTRIN) tablet 600 mg     Current Facility-Administered Medications (Other):  .  alum & mag hydroxide-simeth (MAALOX/MYLANTA) 200-200-20 MG/5ML suspension 30 mL .  citalopram (CELEXA) tablet 30 mg .  divalproex (DEPAKOTE ER) 24 hr tablet 750 mg .  magnesium hydroxide (MILK OF MAGNESIA) suspension 30 mL .  nicotine polacrilex (NICORETTE) gum 2 mg .  QUEtiapine (SEROQUEL XR) 24 hr tablet 200 mg .  QUEtiapine (SEROQUEL) tablet 25 mg .  QUEtiapine (SEROQUEL) tablet 25 mg  No current outpatient prescriptions on file.  Vital Signs:Blood pressure 112/62, pulse 84, temperature 98.1 F (36.7 C), temperature source Oral, resp. rate 16, height 5' 5.5" (1.664 m), weight 111.9 kg (246 lb 12 oz), SpO2 100 %.    Lab Results: No results found for this or any previous visit (from the past 48 hour(s)).  Physical Findings: AIMS: Facial and Oral Movements Muscles of Facial Expression: None, normal Lips and Perioral Area: None, normal Jaw: None, normal Tongue: None, normal,Extremity Movements Upper (arms, wrists, hands, fingers): None, normal Lower (legs, knees, ankles, toes): None, normal, Trunk Movements Neck, shoulders, hips: None, normal, Overall Severity Severity of abnormal movements (highest score from questions above): None, normal Incapacitation due to abnormal movements: None, normal Patient's awareness of abnormal movements (rate only patient's report): No Awareness, Dental Status Current problems with teeth and/or dentures?: No Does patient usually wear dentures?: No  CIWA:  CIWA-Ar Total: 1 COWS:  COWS Total Score: 4   Assessment/Plan: Mr. Elsie StainMarshall's mood appears to be stabilizing. At present  we will continue his current medicines and continue to evaluate if there is any problem with oversedation. Mr. Elison is interested in attending  longer-term treatment for his substance use issues and he is working with social work to have a plan in place prior to discharge.  Acquanetta Sit, MD 03/09/2016, 12:53 PM

## 2016-03-09 NOTE — [facility] Group Notes (Signed)
[facility]  Date/Time: 03/09/2016   1:30PM  Type of Therapy and Topic:  Group Therapy:  Who Am I?  Self Esteem, Self-Actualization and Understanding Self.  Participation Level:  Active  Description of Group:    In this group patients will be asked to explore values, beliefs, truths, and morals as they relate to personal self.  Patients will be guided to discuss their thoughts, feelings, and behaviors related to what they identify as important to their true self. Patients will process together how values, beliefs and truths are connected to specific choices patients make every day. Each patient will be challenged to identify changes that they are motivated to make in order to improve self-esteem and self-actualization. This group will be process-oriented, with patients participating in exploration of their own experiences as well as giving and receiving support and challenge from other group members.  Therapeutic Goals: 1. Patient will identify false beliefs that currently interfere with their self-esteem.  2. Patient will identify feelings, thought process, and behaviors related to self and will become aware of the uniqueness of themselves and of others.  3. Patient will be able to identify and verbalize values, morals, and beliefs as they relate to self. 4. Patient will begin to learn how to build self-esteem/self-awareness by expressing what is important and unique to them personally.  Summary of Patient Progress  Patient shared that taking care of himself physically and emotionally (showering, dressing nice, and socializing) help him to feel better about himself.      Therapeutic Modalities:   Cognitive Behavioral Therapy Solution Focused Therapy Motivational Interviewing Brief Therapy   Samuella BruinKristin Emilian Stawicki, LCSW Clinical Social Worker [facility] [phone]

## 2016-03-09 NOTE — Progress Notes (Signed)
Nursing Note: 0700-1900  D:  Pt presents with depressed mood, blunted affect.  "I just get aggravated."  Pt was cooperative throughout shift but became agitated when he found out that Vistaril had expired.  "I'm going to go off, this ain't right."  Med reordered per MD, pt was able to calm self in dayroom without further intervention. Rates depression 7/10, hopelessness 7/10 and anxiety 8/10.  Pt seems to be enjoying the support of others in milieu, pt receptive to encouragement.  Goal for today: "Talk to CSW about finding a Therapist."  "I am trying to stay positive, don't get so mad."  A:  Encouraged to verbalize needs and concerns, active listening and support provided.  Continued Q 15 minute safety checks.  Observed active participation in group settings.  R:  Pt. denies A/V hallucinations and is able to verbally contract for safety.

## 2016-03-09 NOTE — Progress Notes (Signed)
The patient attended this evening's A.A. Meeting and was appropriate. After the meeting concluded, he stated that two of the speakers mentioned things that made him think about his issues and sobriety.

## 2016-03-10 MED ORDER — GABAPENTIN 100 MG PO CAPS
200.0000 mg | ORAL_CAPSULE | Freq: Three times a day (TID) | ORAL | Status: DC
  Administered 2016-03-10 – 2016-03-11 (×4): 200 mg via ORAL
  Filled 2016-03-10 (×2): qty 2
  Filled 2016-03-10: qty 126
  Filled 2016-03-10 (×5): qty 2
  Filled 2016-03-10: qty 126

## 2016-03-10 MED ORDER — QUETIAPINE FUMARATE 200 MG PO TABS
200.0000 mg | ORAL_TABLET | Freq: Every day | ORAL | Status: DC
  Administered 2016-03-10: 200 mg via ORAL
  Filled 2016-03-10 (×2): qty 1
  Filled 2016-03-10: qty 21

## 2016-03-10 NOTE — Progress Notes (Signed)
D:  Patient visible in dayroom; interacts appropriately with staff and peers; denies suicidal and homicidal ideation and AVH; no self-injurious behaviors noted or reported A:  Medications given as scheduled; emotional support provided; encouraged him to seek assistance with needs/concerns. R:  Remains on q 15 minute safety checks; safety maintained on unit

## 2016-03-10 NOTE — Progress Notes (Signed)
Recreation Therapy Notes  Animal-Assisted Activity (AAA) Program Checklist/Progress Notes Patient Eligibility Criteria Checklist & Daily Group note for Rec TxIntervention  Date: 10.03.2017 Time: 2:45pm Location: [address] Dayroom    AAA/T Program Assumption of Risk Form signed by Patient/ or Parent Legal Guardian Yes  Patient is free of allergies or sever asthma Yes  Patient reports no fear of animals Yes  Patient reports no history of cruelty to animals Yes  Patient understands his/her participation is voluntary Yes  Patient washes hands before animal contact Yes  Patient washes hands after animal contact Yes  Behavioral Response: Engaged, Attentive   Education:Hand Washing, Appropriate Animal Interaction   Education Outcome: Acknowledges education.   Clinical Observations/Feedback: Patient attended session and interacted appropriately with therapy dog and peers. .   Fred Reynolds, LRT/CTRS  Fred Reynolds 03/10/2016 3:02 PM 

## 2016-03-10 NOTE — Progress Notes (Signed)
[facility] coordinator [place].  Patient scheduled for [facility] screening on Thursday 03/12/16 at 7:45am.  Samuella BruinKristin Kaheem Halleck, LCSW Clinical Social Worker [facility] [phone]

## 2016-03-10 NOTE — [facility] Group Notes (Signed)
[facility] LCSW Group Therapy 03/10/2016 1:15 PM Type of Therapy: Group Therapy Participation Level: Active  Participation Quality: Attentive  Affect: Appropriate  Cognitive: Alert and Oriented  Insight: Developing/Improving and Engaged  Engagement in Therapy: Developing/Improving and Engaged  Modes of Intervention: Activity, Clarification, Confrontation, Discussion, Education, Exploration, Limit-setting, Orientation, Problem-solving, Rapport Building, Dance movement psychotherapisteality Testing, Socialization and Support  Summary of Progress/Problems: Patient was attentive and engaged with speaker from [employer]. Patient was attentive to speaker while they shared their story of dealing with mental health and overcoming it. Patient expressed interest in their programs and services and received information on their agency. Patient processed ways they can relate to the speaker.   Fred BruinKristin Mariette Cowley, LCSW Clinical Social Worker [facility] [phone]

## 2016-03-10 NOTE — [facility] Group Notes (Signed)
Patient did not attend group.

## 2016-03-10 NOTE — Progress Notes (Signed)
  [facility] :  Will you be returning to the same living situation after discharge:  No. Patient plans to discharge to [facility] At discharge, do you have transportation home?: Yes,  [facility] to transport Do you have the ability to pay for your medications: Yes,  patient will be provided with prescriptions at discharge  Release of information consent forms completed and in the chart;  Patient's signature needed at discharge.  Patient to Follow up at: Follow-up Information    [facility] .   Why:  Please go on 03/11/16 for continued treatment. Call if you need to reschedule. Bring 21 day supply of medications with you. Contact information: [address] [place] [phone]          Next level of care provider has access to [facility] Link:no  Safety Planning and Suicide Prevention discussed: Yes,  with patient  Have you used any form of tobacco in the last 30 days? (Cigarettes, Smokeless Tobacco, Cigars, and/or Pipes): Yes  Has patient been referred to the Quitline?: Patient refused referral  Patient has been referred for addiction treatment: Yes  Salah Nakamura L Bettylou Frew 03/10/2016, 3:59 PM

## 2016-03-10 NOTE — [facility] Suicide Risk Assessment (Signed)
[facility] Discharge Suicide Risk Assessment   Principal Problem: Opioid type dependence, continuous [facility]) Discharge Diagnoses:  Patient Active Problem List   Diagnosis Date Noted  . Dysthymic disorder [F34.1] 03/05/2016  . Bipolar affective disorder, current episode mixed (HCC) [F31.60] 03/03/2016  . Obesity [E66.9] 07/15/2015  . Hip pain [M25.559] 07/15/2015  . 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 Opioid use disorder, moderate, in sustained remission (HCC) [F11.21] 06/23/2015  . Cocaine use disorder, moderate, in sustained remission (HCC) [F14.21] 06/23/2015  . Hyperprolactinemia (HCC) [E22.1] 06/23/2015  . Benzodiazepine abuse [F13.10] 06/22/2015  . Bipolar disorder, curr episode mixed, severe, w/o psychotic features (HCC) [F31.63] 04/20/2015  . PTSD (post-traumatic stress disorder) [F43.10] 04/20/2015  . Opioid type dependence, continuous (HCC) [F11.20] 04/20/2015  . Cannabis use disorder, severe, dependence (HCC) [F12.20] 04/20/2015  . Tobacco use disorder [F17.200] 04/20/2015    Total Time spent with patient: 15 minutes  Musculoskeletal: Strength & Muscle Tone: within normal limits Gait & Station: normal Patient leans: N/A  Psychiatric Specialty Exam: Review of Systems  All other systems reviewed and are negative.   Blood pressure 92/62, pulse 98, temperature 98 F (36.7 C), temperature source Oral, resp. rate 16, height 5' 5.5" (1.664 m), weight 111.9 kg (246 lb 12 oz), SpO2 100 %.Body mass index is 40.44 kg/m.  General Appearance: Fairly Groomed  Patent attorney::  Good  Speech:  Clear and Coherent409  Volume:  Normal  Mood:  Euthymic  Affect:  Congruent  Thought Process:  Coherent and Goal Directed  Orientation:  Negative  Thought Content:  Negative  Suicidal Thoughts:  No  Homicidal Thoughts:  No  Memory:  Negative  Judgement:  Fair  Insight:  Fair  Psychomotor Activity:  Normal  Concentration:  Good  Recall:  Good  Fund of  Knowledge:Good  Language: Good  Akathisia:  No  Handed:  Right  AIMS (if indicated):   0  Assets:  Communication Skills Resilience  Sleep:  Number of Hours: 4.75  Cognition: WNL  ADL's:  Intact   Mental Status Per Nursing Assessment::   On Admission:  Suicidal ideation indicated by patient, Suicide plan, Plan includes specific time, place, or method, Self-harm thoughts, Self-harm behaviors, Intention to act on suicide plan, Belief that plan would result in death  Demographic Factors:  Male  Loss Factors: Loss of significant relationship  Historical Factors: Impulsivity  Risk Reduction Factors:   Employed and Positive coping skills or problem solving skills  Continued Clinical Symptoms:  Bipolar Disorder:   Depressive phase  Cognitive Features That Contribute To Risk:  None    Suicide Risk:  Mild:  Suicidal ideation of limited frequency, intensity, duration, and specificity.  There are no identifiable plans, no associated intent, mild dysphoria and related symptoms, good self-control (both objective and subjective assessment), few other risk factors, and identifiable protective factors, including available and accessible social support.  Follow-up Information    [facility] .   Why:  Referral sent 9/29, phone screening completed 10/2. Please call admissions coordinator every 2-3 days to inquire about referral status and bed availability.  Contact information: [address] [place] [phone]       [facility] Follow up on 03/12/2016.   Why:  Screening appt on Thursday Oct. 5th at 7:45am. Bring [place] ID, social security card, and 2 week supply of medications. Call office if you need to reschedule.  Contact information: [address] [place] [address] [phone]  [facility] [employer] .   Specialty:  Professional Counselor Why:  Please go to walk-in clinic within 7 days of discharge between Monday-Friday  at 8am for assessment for therapy and medication management services. Initial assessment may take several hours. Bring ID, proof of income, and social security card.  Contact information: [facility] [address] [place] [address] [phone]           Plan Of Care/Follow-up recommendations:  Other:  Mr. Trudie BucklerMarshal has been accepted in to the [facility] program and has a bed for tomorrow. He states "I'm ready to go," and denies any suicidal or homicidal ideation, plan or intent. He will be discharged tomorrow if present course continues to participate in longer term substance focus treatment.  Acquanetta SitElizabeth Woods Oates, MD 03/10/2016, 3:40 PM

## 2016-03-10 NOTE — Progress Notes (Signed)
Nursing tech reports that patient stated that he is supposed to have a visitor tonight and he said twice " I hope she brings me cigarettes".  Charge Nurse made aware.

## 2016-03-10 NOTE — Progress Notes (Signed)
[facility]  03/10/2016 1:31 PM  Patient Active Problem List   Diagnosis Date Noted  . Dysthymic disorder 03/05/2016  . Bipolar affective disorder, current episode mixed (HCC) 03/03/2016  . Obesity 07/15/2015  . Hip pain 07/15/2015  . Moderate benzodiazepine use disorder (HCC) 06/23/2015  . Opioid use disorder, moderate, in sustained remission (HCC) 06/23/2015  . Cocaine use disorder, moderate, in sustained remission (HCC) 06/23/2015  . Hyperprolactinemia (HCC) 06/23/2015  . Benzodiazepine abuse 06/22/2015  . Bipolar disorder, curr episode mixed, severe, w/o psychotic features (HCC) 04/20/2015  . PTSD (post-traumatic stress disorder) 04/20/2015  . Opioid type dependence, continuous (HCC) 04/20/2015  . Cannabis use disorder, severe, dependence (HCC) 04/20/2015  . Tobacco use disorder 04/20/2015    Diagnosis: Opiate use disorder, severe, dysthymic disorder  Subjective: [name] reports that he is aggravated at times but controls it well. He states he has been working hard on his issues in group and feels he has made some progress although he realizes that he does still have a lot to work on. He denies any current suicidal or homicidal ideation, plan or intent. He says at times when he wakes up in the morning  of has a disaffected feeling of  he is why is he here or is his life worth living. He is interested in longer term programming and has had an interview with [facility]. He reports that the Vistaril is not effective and he would like to return to a trial of Neurontin which he states he is previously taken at 300 mg by mouth 3 times a day. Objective: Developed well nourished man in no apparent distress pleasant and cooperative mood is described as sometimes agitated, affect is unremarkable, speech and motor within normal limits, thought processes linear and goal-directed, thought content no current suicidal or homicidal ideation, plan or intent. No auditory of visual hallucinations.  Alert and oriented 3. Insight and judgment are fair IQ appears an average range.         Current Facility-Administered Medications (Analgesics):  .  ibuprofen (ADVIL,MOTRIN) tablet 600 mg     Current Facility-Administered Medications (Other):  .  alum & mag hydroxide-simeth (MAALOX/MYLANTA) 200-200-20 MG/5ML suspension 30 mL .  citalopram (CELEXA) tablet 30 mg .  divalproex (DEPAKOTE ER) 24 hr tablet 750 mg .  gabapentin (NEURONTIN) capsule 200 mg .  hydrOXYzine (ATARAX/VISTARIL) tablet 50 mg .  magnesium hydroxide (MILK OF MAGNESIA) suspension 30 mL .  nicotine polacrilex (NICORETTE) gum 2 mg .  QUEtiapine (SEROQUEL) tablet 200 mg .  QUEtiapine (SEROQUEL) tablet 25 mg .  QUEtiapine (SEROQUEL) tablet 25 mg  No current outpatient prescriptions on file.  Vital Signs:Blood pressure 92/62, pulse 98, temperature 98 F (36.7 C), temperature source Oral, resp. rate 16, height 5' 5.5" (1.664 m), weight 111.9 kg (246 lb 12 oz), SpO2 100 %.    Lab Results: No results found for this or any previous visit (from the past 48 hour(s)).  Physical Findings: AIMS: Facial and Oral Movements Muscles of Facial Expression: None, normal Lips and Perioral Area: None, normal Jaw: None, normal Tongue: None, normal,Extremity Movements Upper (arms, wrists, hands, fingers): None, normal Lower (legs, knees, ankles, toes): None, normal, Trunk Movements Neck, shoulders, hips: None, normal, Overall Severity Severity of abnormal movements (highest score from questions above): None, normal Incapacitation due to abnormal movements: None, normal Patient's awareness of abnormal movements (rate only patient's report): No Awareness, Dental Status Current problems with teeth and/or dentures?: No Does patient usually wear dentures?: No  CIWA:  CIWA-Ar Total: 1 COWS:  COWS Total Score: 3   Assessment/Plan: Patient continues to be appropriate in groups and the milieu and appears to be taking his recovery  activities seriously. At this time we will add gabapentin 200 mg by mouth 3 times a day and possibly advance to 300 mg 3 times a day as tolerated and he continues this in place of Vistaril for anxiety. At present we will continue the Vistaril for a day or 2 before stopping it in order to cross taper. It is planned that later this week Mr. [name] with transition to long-term treatment.  Acquanetta Sit, MD 03/10/2016, 1:31 PM

## 2016-03-10 NOTE — Tx Team (Addendum)
Interdisciplinary Treatment and Diagnostic Plan Update  03/10/2016 Time of Session: 9:30am Fred Reynolds. MRN: [id number]  Principal Diagnosis:  Opioid use disorder, moderate, in sustained remission [F11.90]  Cocaine use disorder, moderate, in sustained remission [F14.90]  Benzodiazepine abuse [F13.10]    Secondary Diagnoses: Principal Problem:   Opioid type dependence, continuous (HCC) Active Problems:   Dysthymic disorder   Current Medications:  Current Facility-Administered Medications  Medication Dose Route Frequency Provider Last Rate Last Dose  . alum & mag hydroxide-simeth (MAALOX/MYLANTA) 200-200-20 MG/5ML suspension 30 mL  30 mL Oral Q4H PRN Acquanetta Sit, MD   30 mL at 03/06/16 1648  . citalopram (CELEXA) tablet 30 mg  30 mg Oral Daily Acquanetta Sit, MD   30 mg at 03/10/16 0744  . divalproex (DEPAKOTE ER) 24 hr tablet 750 mg  750 mg Oral Daily Charm Rings, NP   750 mg at 03/09/16 2213  . hydrOXYzine (ATARAX/VISTARIL) tablet 50 mg  50 mg Oral Q6H PRN Acquanetta Sit, MD   50 mg at 03/10/16 0746  . ibuprofen (ADVIL,MOTRIN) tablet 600 mg  600 mg Oral Q8H PRN Charm Rings, NP      . magnesium hydroxide (MILK OF MAGNESIA) suspension 30 mL  30 mL Oral Daily PRN Charm Rings, NP      . nicotine polacrilex (NICORETTE) gum 2 mg  2 mg Oral PRN Neysa Hotter, MD   2 mg at 03/10/16 0747  . QUEtiapine (SEROQUEL XR) 24 hr tablet 200 mg  200 mg Oral QHS Charm Rings, NP   200 mg at 03/09/16 2213  . QUEtiapine (SEROQUEL) tablet 25 mg  25 mg Oral TID Acquanetta Sit, MD   25 mg at 03/10/16 0744  . QUEtiapine (SEROQUEL) tablet 25 mg  25 mg Oral TID PRN Neysa Hotter, MD   25 mg at 03/10/16 0644   PTA Medications: Prescriptions Prior to Admission  Medication Sig Dispense Refill Last Dose  . citalopram (CELEXA) 20 MG tablet Take 1 tablet (20 mg total) by mouth daily. 30 tablet 0 APRIL OR MAY  . clonazePAM (KLONOPIN) 1 MG tablet Take 0.5 tablets (0.5 mg  total) by mouth at bedtime. 30 tablet 0 APRIL OR MAY  . divalproex (DEPAKOTE) 250 MG DR tablet Take 3 tablets (750 mg total) by mouth at bedtime. 90 tablet 0 APRIL OR MAY  . ibuprofen (ADVIL,MOTRIN) 200 MG tablet Take 400-600 mg by mouth every 6 (six) hours as needed (for pain).   Past Month at Unknown time  . QUEtiapine (SEROQUEL) 200 MG tablet Take 1 tablet (200 mg total) by mouth at bedtime. 30 tablet 0 APRIL OR MAY    Patient Stressors: Marital or family conflict Medication change or noncompliance Substance abuse  Patient Strengths: Motivation for treatment/growth Physical Health Supportive family/friends  Treatment Modalities: Medication Management, Group therapy, Case management,  1 to 1 session with clinician, Psychoeducation, Recreational therapy.   Physician Treatment Plan for Primary Diagnosis:  Opioid use disorder, moderate, in sustained remission [F11.90]  Cocaine use disorder, moderate, in sustained remission [F14.90]  Benzodiazepine abuse [F13.10]   Long Term Goal(s): Improvement in symptoms so as ready for discharge Improvement in symptoms so as ready for discharge   Short Term Goals: Ability to disclose and discuss suicidal ideas Ability to identify and develop effective coping behaviors will improve Compliance with prescribed medications will improve Ability to identify changes in lifestyle to reduce recurrence of condition will improve Ability to verbalize feelings will improve Ability  to disclose and discuss suicidal ideas Ability to demonstrate self-control will improve Ability to identify and develop effective coping behaviors will improve Compliance with prescribed medications will improve  Medication Management: Evaluate patient's response, side effects, and tolerance of medication regimen.  Therapeutic Interventions: 1 to 1 sessions, Unit Group sessions and Medication administration.  Evaluation of Outcomes: Adequate for Discharge  Physician Treatment  Plan for Secondary Diagnosis: Principal Problem:   Opioid type dependence, continuous (HCC) Active Problems:   Dysthymic disorder  Long Term Goal(s): Improvement in symptoms so as ready for discharge Improvement in symptoms so as ready for discharge   Short Term Goals: Ability to disclose and discuss suicidal ideas Ability to identify and develop effective coping behaviors will improve Compliance with prescribed medications will improve Ability to identify changes in lifestyle to reduce recurrence of condition will improve Ability to verbalize feelings will improve Ability to disclose and discuss suicidal ideas Ability to demonstrate self-control will improve Ability to identify and develop effective coping behaviors will improve Compliance with prescribed medications will improve     Medication Management: Evaluate patient's response, side effects, and tolerance of medication regimen.  Therapeutic Interventions: 1 to 1 sessions, Unit Group sessions and Medication administration.  Evaluation of Outcomes: Adequate for Discharge   RN Treatment Plan for Primary Diagnosis:  Opioid use disorder, moderate, in sustained remission [F11.90]  Cocaine use disorder, moderate, in sustained remission [F14.90]  Benzodiazepine abuse [F13.10]   Long Term Goal(s): Knowledge of disease and therapeutic regimen to maintain health will improve  Short Term Goals: Ability to remain free from injury will improve, Ability to disclose and discuss suicidal ideas, Ability to identify and develop effective coping behaviors will improve and Compliance with prescribed medications will improve  Medication Management: RN will administer medications as ordered by provider, will assess and evaluate patient's response and provide education to patient for prescribed medication. RN will report any adverse and/or side effects to prescribing provider.  Therapeutic Interventions: 1 on 1 counseling sessions,  Psychoeducation, Medication administration, Evaluate responses to treatment, Monitor vital signs and CBGs as ordered, Perform/monitor CIWA, COWS, AIMS and Fall Risk screenings as ordered, Perform wound care treatments as ordered.  Evaluation of Outcomes: Adequate for Discharge   LCSW Treatment Plan for Primary Diagnosis:  Opioid use disorder, moderate, in sustained remission [F11.90]  Cocaine use disorder, moderate, in sustained remission [F14.90]  Benzodiazepine abuse [F13.10]   Long Term Goal(s): Safe transition to appropriate next level of care at discharge, Engage patient in therapeutic group addressing interpersonal concerns.  Short Term Goals: Engage patient in aftercare planning with referrals and resources, Increase social support, Increase emotional regulation, Identify triggers associated with mental health/substance abuse issues and Increase skills for wellness and recovery  Therapeutic Interventions: Assess for all discharge needs, 1 to 1 time with Social worker, Explore available resources and support systems, Assess for adequacy in community support network, Educate family and significant other(s) on suicide prevention, Complete Psychosocial Assessment, Interpersonal group therapy.  Evaluation of Outcomes: Adequate for Discharge   Progress in Treatment :  Attending groups: Yes  Participating in groups: Yes  Taking medication as prescribed: Yes, MD continuing to assess for appropriate medication regimen  Toleration medication: Yes  Family/Significant other contact made: No, patient has declined for collateral contact   Patient understands diagnosis: Yes  Discussing patient identified problems/goals with staff: Yes  Medical problems stabilized or resolved: Yes  Denies suicidal/homicidal ideation: No, patient denies SI  Issues/concerns per patient self-inventory: None reported  Other: N/A  New problem(s) identified: None reported at this time    New Short  Term/Long Term Goal(s): None at this time    Discharge Plan or Barriers: Patient interested in residential treatment. [facility]   Reason for Continuation of Hospitalization: Anxiety Depression Medication stabilization Suicidal Ideations Withdrawal symptoms  Estimated Length of Stay: Discharge anticipated for 03/11/16    Attendees:  Patient:  Physician: Dr. Mckinley Jewelates, MD 03/10/2016 9:30am  Nursing: Ida RogueKaren Castro, Elby Beckoni Waller,  RN 03/10/2016  9:30am  RN Care Manager: Onnie BoerJennifer Clark, CM 03/10/2016  9:30am  Social Workers:  Samuella BruinKristin Leonette Tischer, LCSW, 03/10/2016  9:30am  Nurse Pratictioners: Serena ColonelAggie Nwoko, May Augustin NP 03/10/2016   Scribe for Treatment Team: Samuella BruinKristin Janay Canan, LCSW Clinical Social Worker [facility] [phone]

## 2016-03-10 NOTE — Plan of Care (Signed)
Problem: Activity: Goal: Interest or engagement in activities will improve Outcome: Progressing Visible in dayroom; interacts appropriately with staff and peers  Problem: Safety: Goal: Periods of time without injury will increase Outcome: Progressing Safety  Maintained on unit  Problem: Medication: Goal: Compliance with prescribed medication regimen will improve Outcome: Progressing Compliant with medication regimen  Problem: Self-Concept: Goal: Ability to disclose and discuss suicidal ideas will improve Outcome: Progressing Denies suicidal ideation

## 2016-03-10 NOTE — Plan of Care (Signed)
Problem: Education: Goal: Knowledge of disease or condition will improve Outcome: Progressing Pt stated plans to continue taking medications after discharge

## 2016-03-11 MED ORDER — DIVALPROEX SODIUM ER 250 MG PO TB24
750.0000 mg | ORAL_TABLET | Freq: Every day | ORAL | Status: DC
  Filled 2016-03-11: qty 63

## 2016-03-11 MED ORDER — QUETIAPINE FUMARATE 200 MG PO TABS: 200.0000 mg | ORAL_TABLET | Freq: Every day | ORAL | 0 refills | Status: DC

## 2016-03-11 MED ORDER — DIVALPROEX SODIUM ER 250 MG PO TB24
750.0000 mg | ORAL_TABLET | Freq: Every day | ORAL | Status: DC
  Filled 2016-03-11 (×3): qty 63

## 2016-03-11 MED ORDER — DIVALPROEX SODIUM ER 250 MG PO TB24: 750.0000 mg | ORAL_TABLET | Freq: Every day | ORAL | 0 refills | Status: DC

## 2016-03-11 MED ORDER — NICOTINE POLACRILEX 2 MG MT GUM: 2.0000 mg | CHEWING_GUM | OROMUCOSAL | 0 refills | Status: DC | PRN

## 2016-03-11 MED ORDER — GABAPENTIN 100 MG PO CAPS: 200.0000 mg | ORAL_CAPSULE | Freq: Three times a day (TID) | ORAL | 0 refills | Status: DC

## 2016-03-11 MED ORDER — IBUPROFEN 200 MG PO TABS: 400.0000 mg | ORAL_TABLET | Freq: Four times a day (QID) | ORAL | 0 refills | Status: DC | PRN

## 2016-03-11 MED ORDER — HYDROXYZINE HCL 50 MG PO TABS: 50.0000 mg | ORAL_TABLET | Freq: Four times a day (QID) | ORAL | 0 refills | Status: DC | PRN

## 2016-03-11 MED ORDER — CITALOPRAM HYDROBROMIDE 10 MG PO TABS: 30.0000 mg | ORAL_TABLET | Freq: Every day | ORAL | 0 refills | Status: DC

## 2016-03-11 NOTE — Plan of Care (Signed)
Problem: Activity: Goal: Interest or engagement in activities will improve Outcome: Progressing Interacts appropriately with staff/peers  Problem: Safety: Goal: Periods of time without injury will increase Outcome: Progressing Safety maintained on unit

## 2016-03-11 NOTE — Clinical Social Work Note (Signed)
[facility], states patient arrived stating he receives Seroquel during the day - states discharge information states he only receives Seroquel at night.  Wants call back to clarify medication to be given at facility.  RN Clydie BraunKaren informed and asked to call.  Santa GeneraAnne Manford Sprong, LCSW Lead Clinical Social Worker Phone:  [phone]

## 2016-03-11 NOTE — Progress Notes (Signed)
  [facility] Dis[facility] :  Will you be returning to the same living situation after discharge:  No.  Patient has been accepted to [facility] At discharge, do you have transportation home?: Yes,  [facility] coming to pick patient up Do you have the ability to pay for your medications: Yes,  [facility] has supplied medications  Release of information consent forms completed and in the chart;  Patient's signature needed at discharge.  Patient to Follow up at: Follow-up Information    [facility] .   Why:  Please go on 03/11/16 for continued treatment. Call if you need to reschedule. Bring 21 day supply of medications with you. Contact information: [address] [place] [phone]          Next level of care provider has access to [facility] Link:yes  Safety Planning and Suicide Prevention discussed: Yes,  completed in morning group  Have you used any form of tobacco in the last 30 days? (Cigarettes, Smokeless Tobacco, Cigars, and/or Pipes): Yes  Has patient been referred to the Quitline?: Patient refused referral  Patient has been referred for addiction treatment: Yes  Raye SorrowCoble, Dondi Aime N 03/11/2016, 10:33 AM

## 2016-03-11 NOTE — Progress Notes (Signed)
D:  Patient visible in dayroom; interacts appropriately with staff and peers; denies suicidal and homicidal ideation and AVH; no self-injurious behaviors noted or reported A:  Medications given as scheduled; emotional support provided; encouraged him to seek assistance with needs/concerns. R:  Remains on q 15 minute safety checks; safety maintained on unit

## 2016-03-11 NOTE — Progress Notes (Signed)
Recreation Therapy Notes  Date: 03/11/16 Time: 0930 Location: [address] Group Room  Group Topic: Stress Management  Goal Area(s) Addresses:  Patient will verbalize importance of using healthy stress management.  Patient will identify positive emotions associated with healthy stress management.   Intervention: Stress Management  Activity :  Progressive Muscle Relaxation.  LRT introduced the technique of progressive muscle relaxation to the group.  LRT read script to engage patients in the technique.  Patients were to follow along as LRT read a script to participate in activity.  Education:  Stress Management, Discharge Planning.   Education Outcome: Acknowledges edcuation/In group clarification offered/Needs additional education  Clinical Observations/Feedback: Pt did not attend group.     Caroll RancherMarjette Cleaven Demario, LRT/CTRS         Lillia AbedLindsay, Andrell Tallman A 03/11/2016 11:50 AM

## 2016-03-11 NOTE — Discharge Planning (Signed)
Spoke with Olegario MessierKathy and [facility] and clarified patient's discharge order for Seroquel.  Per Discharge Instructions patient was instructed to take Seroquel 200mg  at night.  Seroquel 25mg  tid was not continued at discharge.

## 2016-03-11 NOTE — Progress Notes (Signed)
Written/verbal discharge instructions, prescriptions, sample medications, SRA, transition report and follow-up appointments given to patient with verbalization of understanding;  Patient denies suicidal and homicidal ideation. Suicide Prevention information/materials given to patient  All patient belongings returned to patient at time of discharge. Discharged n stable condition. Patient to follow up with [facility].  [facility].

## 2016-03-11 NOTE — Progress Notes (Signed)
   D: When asked about his day pt stated, "fine". When asked if he had any questions, "no".  When asked pt about his plans pt stated, "to get clean and stay clean". Stated [facility].   A:  Support and encouragement was offered. 15 min checks continued for safety.  R: Pt remains safe.

## 2016-03-11 NOTE — [facility] Suicide Risk Assessment (Signed)
[facility]   Principal Problem: Opioid type dependence, continuous [facility](HCC) Discharge Diagnoses:  Patient Active Problem List   Diagnosis Date Noted  . Dysthymic disorder [F34.1] 03/05/2016  . Bipolar affective disorder, current episode mixed (HCC) [F31.60] 03/03/2016  . Obesity [E66.9] 07/15/2015  . Hip pain [M25.559] 07/15/2015  . 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 Opioid use disorder, moderate, in sustained remission (HCC) [F11.21] 06/23/2015  . Cocaine use disorder, moderate, in sustained remission (HCC) [F14.21] 06/23/2015  . Hyperprolactinemia (HCC) [E22.1] 06/23/2015  . Benzodiazepine abuse [F13.10] 06/22/2015  . Bipolar disorder, curr episode mixed, severe, w/o psychotic features (HCC) [F31.63] 04/20/2015  . PTSD (post-traumatic stress disorder) [F43.10] 04/20/2015  . Opioid type dependence, continuous (HCC) [F11.20] 04/20/2015  . Cannabis use disorder, severe, dependence (HCC) [F12.20] 04/20/2015  . Tobacco use disorder [F17.200] 04/20/2015    Total Time spent with patient: 15 minutes  Musculoskeletal: Strength & Muscle Tone: within normal limits Gait & Station: normal Patient leans: N/A  Psychiatric Specialty Exam: Review of Systems  All other systems reviewed and are negative.   Blood pressure (!) 108/54, pulse 74, temperature 98.2 F (36.8 C), temperature source Oral, resp. rate 18, height 5' 5.5" (1.664 m), weight 111.9 kg (246 lb 12 oz), SpO2 100 %.Body mass index is 40.44 kg/m.  General Appearance: Casual  Eye Contact::  Good  Speech:  Clear and Coherent409  Volume:  Normal  Mood:  Euthymic  Affect:  Congruent  Thought Process:  Coherent and Goal Directed  Orientation:  Full (Time, Place, and Person)  Thought Content:  Negative  Suicidal Thoughts:  No  Homicidal Thoughts:  No  Memory:  Negative  Judgement:  Good  Insight:  Good  Psychomotor Activity:  Normal  Concentration:  Good  Recall:  Good   Fund of Knowledge:Good  Language: Good  Akathisia:  No  Handed:  Right  AIMS (if indicated):   0  Assets:  Desire for Improvement  Sleep:  Number of Hours: 6.75  Cognition: WNL  ADL's:  Intact   Mental Status Per Nursing Assessment::   On Admission:  Suicidal ideation indicated by patient, Suicide plan, Plan includes specific time, place, or method, Self-harm thoughts, Self-harm behaviors, Intention to act on suicide plan, Belief that plan would result in death  Demographic Factors:  Male  Loss Factors: Decrease in vocational status and Loss of significant relationship  Historical Factors: NA  Risk Reduction Factors:   Employed  Continued Clinical Symptoms:  Alcohol/Substance Abuse/Dependencies  Cognitive Features That Contribute To Risk:  None    Suicide Risk:  Minimal: No identifiable suicidal ideation.  Patients presenting with no risk factors but with morbid ruminations; may be classified as minimal risk based on the severity of the depressive symptoms  Follow-up Information    [facility] .   Why:  Please go on 03/11/16 for continued treatment. Call if you need to reschedule. Bring 21 day supply of medications with you. Contact information: [address] [place] [phone]          Plan Of Care/Follow-up recommendations:  Other:  follow up with [facility]  Acquanetta SitElizabeth Woods Oates, MD 03/11/2016, 10:38 AM
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
## 2016-04-02 MED FILL — GABAPENTIN 100 MG CAPSULE: 100 | 30 days supply | Qty: 180 | Fill #0

## 2016-04-02 MED FILL — HYDROXYZINE PAM 50 MG CAP: 50 | 30 days supply | Qty: 120 | Fill #0

## 2016-04-02 MED FILL — QUETIAPINE FUMARATE 200 MG: 200 | 30 days supply | Qty: 30 | Fill #0

## 2016-04-02 MED FILL — ?CITALOPRAM HBR 10 MG TABLE: 10 | 30 days supply | Qty: 90 | Fill #0

## 2016-04-02 MED FILL — DIVALPROEX SOD ER 250 MG TA: 250 | 30 days supply | Qty: 90 | Fill #0

## 2016-04-08 ENCOUNTER — Emergency Department (HOSPITAL_COMMUNITY)
Admission: EM | Admit: 2016-04-08 | Discharge: 2016-04-08 | Disposition: A | Discharge status: Home / Self Care | Payer: Self-pay | Attending: Emergency Medicine | Admitting: Emergency Medicine

## 2016-04-08 ENCOUNTER — Encounter (HOSPITAL_COMMUNITY): Payer: Self-pay | Admitting: Emergency Medicine

## 2016-04-08 ENCOUNTER — Emergency Department (HOSPITAL_COMMUNITY): Payer: Self-pay

## 2016-04-08 DIAGNOSIS — Y999 Unspecified external cause status: Secondary | ICD-10-CM | POA: Insufficient documentation

## 2016-04-08 DIAGNOSIS — W230XXA Caught, crushed, jammed, or pinched between moving objects, initial encounter: Secondary | ICD-10-CM | POA: Insufficient documentation

## 2016-04-08 DIAGNOSIS — S60042A Contusion of left ring finger without damage to nail, initial encounter: Secondary | ICD-10-CM | POA: Insufficient documentation

## 2016-04-08 DIAGNOSIS — F17210 Nicotine dependence, cigarettes, uncomplicated: Secondary | ICD-10-CM | POA: Insufficient documentation

## 2016-04-08 DIAGNOSIS — J45909 Unspecified asthma, uncomplicated: Secondary | ICD-10-CM | POA: Insufficient documentation

## 2016-04-08 DIAGNOSIS — Y929 Unspecified place or not applicable: Secondary | ICD-10-CM | POA: Insufficient documentation

## 2016-04-08 DIAGNOSIS — Y939 Activity, unspecified: Secondary | ICD-10-CM | POA: Insufficient documentation

## 2016-04-08 MED ORDER — IBUPROFEN 600 MG PO TABS: 600.0000 mg | ORAL_TABLET | Freq: Three times a day (TID) | ORAL | 0 refills | Status: DC | PRN

## 2016-04-08 MED ORDER — IBUPROFEN 400 MG PO TABS
600.0000 mg | ORAL_TABLET | Freq: Once | ORAL | Status: AC
  Administered 2016-04-08: 600 mg via ORAL
  Filled 2016-04-08: qty 1

## 2016-04-08 NOTE — ED Triage Notes (Signed)
Patient states that he caught his left ring finger between lawn mower deck and his boot yesterday.  His finger is swollen, painful, red and hard to move.  Patient has taken Aleve earlier in the day.
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
## 2016-04-14 ENCOUNTER — Inpatient Hospital Stay (HOSPITAL_COMMUNITY)
Admission: EM | Admit: 2016-04-14 | Discharge: 2016-04-21 | DRG: 917 | Disposition: A | Discharge status: Home / Self Care | Payer: Federal, State, Local not specified - Other | Attending: Internal Medicine | Admitting: Internal Medicine

## 2016-04-14 ENCOUNTER — Emergency Department (HOSPITAL_COMMUNITY): Payer: Self-pay

## 2016-04-14 ENCOUNTER — Encounter (HOSPITAL_COMMUNITY): Payer: Self-pay | Admitting: *Deleted

## 2016-04-14 DIAGNOSIS — T50902A Poisoning by unspecified drugs, medicaments and biological substances, intentional self-harm, initial encounter: Secondary | ICD-10-CM | POA: Diagnosis not present

## 2016-04-14 DIAGNOSIS — R400 Somnolence: Secondary | ICD-10-CM | POA: Diagnosis not present

## 2016-04-14 DIAGNOSIS — J4520 Mild intermittent asthma, uncomplicated: Secondary | ICD-10-CM | POA: Diagnosis not present

## 2016-04-14 DIAGNOSIS — J9601 Acute respiratory failure with hypoxia: Secondary | ICD-10-CM | POA: Diagnosis present

## 2016-04-14 DIAGNOSIS — F17210 Nicotine dependence, cigarettes, uncomplicated: Secondary | ICD-10-CM | POA: Diagnosis present

## 2016-04-14 DIAGNOSIS — R45851 Suicidal ideations: Secondary | ICD-10-CM | POA: Diagnosis present

## 2016-04-14 DIAGNOSIS — F4310 Post-traumatic stress disorder, unspecified: Secondary | ICD-10-CM | POA: Diagnosis present

## 2016-04-14 DIAGNOSIS — F319 Bipolar disorder, unspecified: Secondary | ICD-10-CM | POA: Diagnosis present

## 2016-04-14 DIAGNOSIS — T424X2A Poisoning by benzodiazepines, intentional self-harm, initial encounter: Principal | ICD-10-CM | POA: Diagnosis present

## 2016-04-14 DIAGNOSIS — G92 Toxic encephalopathy: Secondary | ICD-10-CM | POA: Diagnosis present

## 2016-04-14 DIAGNOSIS — R062 Wheezing: Secondary | ICD-10-CM

## 2016-04-14 DIAGNOSIS — J45909 Unspecified asthma, uncomplicated: Secondary | ICD-10-CM | POA: Diagnosis present

## 2016-04-14 DIAGNOSIS — F1120 Opioid dependence, uncomplicated: Secondary | ICD-10-CM | POA: Diagnosis present

## 2016-04-14 DIAGNOSIS — Z811 Family history of alcohol abuse and dependence: Secondary | ICD-10-CM

## 2016-04-14 DIAGNOSIS — J9602 Acute respiratory failure with hypercapnia: Secondary | ICD-10-CM | POA: Diagnosis present

## 2016-04-14 LAB — CBC WITH DIFFERENTIAL/PLATELET
BASOS ABS: 0 10*3/uL (ref 0.0–0.1)
BASOS PCT: 0 %
EOS ABS: 0.1 10*3/uL (ref 0.0–0.7)
Eosinophils Relative: 1 %
HCT: 43 % (ref 39.0–52.0)
HEMOGLOBIN: 14.6 g/dL (ref 13.0–17.0)
Lymphocytes Relative: 11 %
Lymphs Abs: 1.3 10*3/uL (ref 0.7–4.0)
MCH: 31.4 pg (ref 26.0–34.0)
MCHC: 34 g/dL (ref 30.0–36.0)
MCV: 92.5 fL (ref 78.0–100.0)
MONO ABS: 0.7 10*3/uL (ref 0.1–1.0)
MONOS PCT: 6 %
NEUTROS PCT: 82 %
Neutro Abs: 10.1 10*3/uL — ABNORMAL HIGH (ref 1.7–7.7)
Platelets: 204 10*3/uL (ref 150–400)
RBC: 4.65 MIL/uL (ref 4.22–5.81)
RDW: 14.6 % (ref 11.5–15.5)
WBC: 12.3 10*3/uL — ABNORMAL HIGH (ref 4.0–10.5)

## 2016-04-14 LAB — SALICYLATE LEVEL

## 2016-04-14 LAB — CBG MONITORING, ED: GLUCOSE-CAPILLARY: 144 mg/dL — AB (ref 65–99)

## 2016-04-14 LAB — BLOOD GAS, ARTERIAL
ACID-BASE EXCESS: 2 mmol/L (ref 0.0–2.0)
BICARBONATE: 28.7 mmol/L — AB (ref 20.0–28.0)
DRAWN BY: 11249
O2 CONTENT: 2 L/min
O2 Saturation: 91.3 %
PATIENT TEMPERATURE: 98.6
PO2 ART: 64.8 mmHg — AB (ref 83.0–108.0)
pCO2 arterial: 55.4 mmHg — ABNORMAL HIGH (ref 32.0–48.0)
pH, Arterial: 7.335 — ABNORMAL LOW (ref 7.350–7.450)

## 2016-04-14 LAB — BASIC METABOLIC PANEL
ANION GAP: 7 (ref 5–15)
BUN: 14 mg/dL (ref 6–20)
CALCIUM: 8.5 mg/dL — AB (ref 8.9–10.3)
CO2: 28 mmol/L (ref 22–32)
CREATININE: 1.03 mg/dL (ref 0.61–1.24)
Chloride: 103 mmol/L (ref 101–111)
Glucose, Bld: 108 mg/dL — ABNORMAL HIGH (ref 65–99)
Potassium: 4.7 mmol/L (ref 3.5–5.1)
Sodium: 138 mmol/L (ref 135–145)

## 2016-04-14 LAB — ETHANOL

## 2016-04-14 LAB — ACETAMINOPHEN LEVEL

## 2016-04-14 MED ORDER — NALOXONE HCL 0.4 MG/ML IJ SOLN
0.4000 mg | Freq: Once | INTRAMUSCULAR | Status: AC
  Administered 2016-04-14: 0.4 mg via INTRAVENOUS
  Filled 2016-04-14: qty 1

## 2016-04-14 MED ORDER — NALOXONE HCL 2 MG/2ML IJ SOSY
1.0000 mg/h | PREFILLED_SYRINGE | INTRAVENOUS | Status: DC
  Administered 2016-04-14: 0.5 mg/h via INTRAVENOUS
  Filled 2016-04-14: qty 4

## 2016-04-14 MED ORDER — NALOXONE HCL 0.4 MG/ML IJ SOLN
0.4000 mg | Freq: Once | INTRAMUSCULAR | Status: AC
  Administered 2016-04-14: 0.4 mg via INTRAVENOUS

## 2016-04-14 NOTE — ED Notes (Signed)
Pt continues to sleep heavily. Arousable to voice.

## 2016-04-14 NOTE — Progress Notes (Addendum)
Patient noted to not have insurance, [name].   Patient presents to ED with OD. Patient with pmhx of bipolar, PTSD, cocaine heroin and benzo abuse. [facility]. Per chart review [facility]. Further chart review reveals patient once had the orange card.  [facility] on 07/14/15 and is not eligible for this assistance until Feb 2018.  2226pm A. Montarius Kitagawa [facility]CM [facility] went to speak to patient at bedside, however patient is too drowsy at this time to participate in [facility].

## 2016-04-14 NOTE — ED Notes (Signed)
ED provider at bedside speaking with family. Unable to collect labs at this time. Requesting phlebotomy assistance.

## 2016-04-14 NOTE — ED Triage Notes (Signed)
Per family pt is a known narcotics user (pills) and was found unresponsive today. Given a total of 1mg  narcan en route IV. Two NPA and assistive ventilations upon arrival. Alert to painful stimuli at this time.

## 2016-04-14 NOTE — ED Notes (Signed)
Pt now expressing suicidal ideation. Hospital provided sitter at bedside.
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
## 2016-04-14 NOTE — ED Notes (Signed)
I attempted twice to collect labs and was unsuccessful 
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
## 2016-04-14 NOTE — ED Notes (Signed)
Pt is now alert and able to carry on a conversation but is still drowsy.

## 2016-04-14 NOTE — ED Notes (Signed)
RTT called for ABG.

## 2016-04-15 DIAGNOSIS — F1120 Opioid dependence, uncomplicated: Secondary | ICD-10-CM

## 2016-04-15 DIAGNOSIS — F319 Bipolar disorder, unspecified: Secondary | ICD-10-CM

## 2016-04-15 DIAGNOSIS — T50902S Poisoning by unspecified drugs, medicaments and biological substances, intentional self-harm, sequela: Secondary | ICD-10-CM | POA: Diagnosis not present

## 2016-04-15 DIAGNOSIS — T424X4A Poisoning by benzodiazepines, undetermined, initial encounter: Secondary | ICD-10-CM

## 2016-04-15 DIAGNOSIS — R45851 Suicidal ideations: Secondary | ICD-10-CM | POA: Diagnosis not present

## 2016-04-15 DIAGNOSIS — Z79899 Other long term (current) drug therapy: Secondary | ICD-10-CM

## 2016-04-15 DIAGNOSIS — T50902A Poisoning by unspecified drugs, medicaments and biological substances, intentional self-harm, initial encounter: Secondary | ICD-10-CM | POA: Diagnosis not present

## 2016-04-15 DIAGNOSIS — Z888 Allergy status to other drugs, medicaments and biological substances status: Secondary | ICD-10-CM

## 2016-04-15 DIAGNOSIS — R400 Somnolence: Secondary | ICD-10-CM | POA: Diagnosis not present

## 2016-04-15 DIAGNOSIS — Z818 Family history of other mental and behavioral disorders: Secondary | ICD-10-CM

## 2016-04-15 DIAGNOSIS — F17210 Nicotine dependence, cigarettes, uncomplicated: Secondary | ICD-10-CM

## 2016-04-15 LAB — RAPID URINE DRUG SCREEN, HOSP PERFORMED
AMPHETAMINES: NOT DETECTED
BARBITURATES: NOT DETECTED
Benzodiazepines: POSITIVE — AB
COCAINE: NOT DETECTED
Opiates: POSITIVE — AB
TETRAHYDROCANNABINOL: NOT DETECTED

## 2016-04-15 MED ORDER — SODIUM CHLORIDE 0.45 % IV SOLN: INTRAVENOUS | Status: DC

## 2016-04-15 MED ORDER — CITALOPRAM HYDROBROMIDE 20 MG PO TABS
30.0000 mg | ORAL_TABLET | Freq: Every day | ORAL | Status: DC
  Administered 2016-04-15 – 2016-04-21 (×7): 30 mg via ORAL
  Filled 2016-04-15 (×7): qty 2

## 2016-04-15 MED ORDER — ZIPRASIDONE MESYLATE 20 MG IM SOLR
20.0000 mg | Freq: Once | INTRAMUSCULAR | Status: AC
  Administered 2016-04-15: 20 mg via INTRAMUSCULAR
  Filled 2016-04-15: qty 20

## 2016-04-15 MED ORDER — QUETIAPINE FUMARATE 100 MG PO TABS
200.0000 mg | ORAL_TABLET | Freq: Every day | ORAL | Status: DC
  Administered 2016-04-15 – 2016-04-20 (×6): 200 mg via ORAL
  Filled 2016-04-15 (×6): qty 2

## 2016-04-15 MED ORDER — ENOXAPARIN SODIUM 40 MG/0.4ML ~~LOC~~ SOLN
40.0000 mg | SUBCUTANEOUS | Status: DC
  Filled 2016-04-15: qty 0.4

## 2016-04-15 MED ORDER — NALOXONE HCL 2 MG/2ML IJ SOSY
0.5000 mg/h | PREFILLED_SYRINGE | INTRAVENOUS | Status: DC
  Filled 2016-04-15: qty 4

## 2016-04-15 MED ORDER — SODIUM CHLORIDE 0.9 % IV SOLN: INTRAVENOUS | Status: DC

## 2016-04-15 MED ORDER — DIVALPROEX SODIUM ER 250 MG PO TB24
750.0000 mg | ORAL_TABLET | Freq: Every day | ORAL | Status: DC
  Administered 2016-04-15 – 2016-04-21 (×7): 750 mg via ORAL
  Filled 2016-04-15 (×7): qty 1

## 2016-04-15 MED ORDER — GABAPENTIN 100 MG PO CAPS
200.0000 mg | ORAL_CAPSULE | Freq: Three times a day (TID) | ORAL | Status: DC
  Administered 2016-04-15 – 2016-04-21 (×18): 200 mg via ORAL
  Filled 2016-04-15 (×18): qty 2

## 2016-04-15 MED ORDER — ONDANSETRON HCL 4 MG/2ML IJ SOLN: 4.0000 mg | Freq: Four times a day (QID) | INTRAMUSCULAR | Status: DC | PRN

## 2016-04-15 MED ORDER — HYDROXYZINE HCL 25 MG PO TABS
50.0000 mg | ORAL_TABLET | Freq: Four times a day (QID) | ORAL | Status: DC | PRN
  Administered 2016-04-16 – 2016-04-21 (×8): 50 mg via ORAL
  Filled 2016-04-15 (×9): qty 2

## 2016-04-15 MED ORDER — ONDANSETRON HCL 4 MG PO TABS: 4.0000 mg | ORAL_TABLET | Freq: Four times a day (QID) | ORAL | Status: DC | PRN

## 2016-04-15 MED ORDER — ALBUTEROL SULFATE (2.5 MG/3ML) 0.083% IN NEBU
2.5000 mg | INHALATION_SOLUTION | RESPIRATORY_TRACT | Status: DC | PRN
  Administered 2016-04-15: 2.5 mg via RESPIRATORY_TRACT
  Filled 2016-04-15: qty 3

## 2016-04-15 MED ORDER — STERILE WATER FOR INJECTION IJ SOLN
INTRAMUSCULAR | Status: AC
  Administered 2016-04-15: 14:00:00
  Filled 2016-04-15: qty 10

## 2016-04-15 NOTE — ED Notes (Signed)
Pt refuses cardiac monitor 

## 2016-04-15 NOTE — ED Notes (Signed)
Pt called out, he is alert and oriented, reports taking "15-20 Klonopin and some Xanax". He denies suicide attempt or suicidal ideations at this time, sts "I was trying to have a good time".

## 2016-04-15 NOTE — ED Notes (Signed)
Pt refused vital signs.

## 2016-04-15 NOTE — Progress Notes (Signed)
PROGRESS NOTE                                                                                                                                                                                                             Patient Demographics:    Fred Reynolds, is a 30 y.o. male, DOB - 12-Apr-1986, ZOX:[id number]  Admit date - 04/14/2016   Admitting Physician Delano Metz, MD  Outpatient Primary MD for the patient is [name], MD  LOS - 1    Chief Complaint  Patient presents with  . Drug Overdose       Brief Narrative   30 y.o. male with history of severe depression, drug addiction, bipolar d/o found unresponsive at home with snoring respirations at his parents house.  Recently released from drug rehab program.  In ED was given IV Narcan low doses x 2 and awakened somewhat.  Has known history of heroin abuse.  STaff says that after awakening some he said that he had been "popping xanax like popcorn", and that he "didn't want to live anymore". Required IV Narcan initially, back to baseline, IV seat as he wanted to leave, seen by psychiatry, who recommended inpatient psych admission  Subjective:    Fred Reynolds today Is extremely agitated this a.m., wants to go home, discussed with patient, explained he can go home, he denies any headache, chest pain, shortness of breath, nausea or vomiting  Assessment  & Plan :    Principal Problem:   Drug overdose Active Problems:   Bipolar disease, chronic (HCC)   Opioid type dependence, continuous (HCC)   Altered mental status   Somnolence   Suicidal ideation   Wheezing   Asthma  Acute encephalopathy - Patient presents with somnolence and lethargy, this is most likely due to benzodiazepine overdose, required Narcan drip initially. - Currently awake alert, back to baseline  Bipolar disorder, with suicidal ideation on presentation - Patient has been IVC, seen by psychiatric, recommendation for inpatient psych admission, awaiting  bed availability  History of asthma - No active wheezing, when necessary meds    Code Status : Full  Family Communication  : none at bedside  Disposition Plan  : Medically cleared for discharge, awaiting inpatient psych availability   Consults  :  Psychiatry  Procedures  : None  DVT Prophylaxis  :  SCDs   Lab Results  Component Value Date   PLT 204 04/14/2016    Antibiotics  :    Anti-infectives  None        Objective:   Vitals:   04/15/16 0400 04/15/16 0500 04/15/16 0530 04/15/16 0542  BP: 122/76 109/65 108/56   Pulse: 79 89 94   Resp: 13 10 20    Temp:      TempSrc:      SpO2: 93% 92% 93% 95%    Wt Readings from Last 3 Encounters:  03/03/16 110.2 kg (243 lb)  07/15/15 111.8 kg (246 lb 6.4 oz)  11/29/14 104.3 kg (230 lb)    No intake or output data in the 24 hours ending 04/15/16 1530   Physical Exam  Awake Alert, Oriented X 3,  [place].AT,PERRAL Supple Neck,No JVD, No cervical lymphadenopathy appriciated.  Symmetrical Chest wall movement, Good air movement bilaterally, CTAB RRR,No Gallops,Rubs or new Murmurs, No Parasternal Heave +ve B.Sounds, Abd Soft, No tenderness, No organomegaly appriciated, No rebound - guarding or rigidity. No Cyanosis, Clubbing or edema, No new Rash or bruise      Data Review:    CBC  Recent Labs Lab 04/14/16 1942  WBC 12.3*  HGB 14.6  HCT 43.0  PLT 204  MCV 92.5  MCH 31.4  MCHC 34.0  RDW 14.6  LYMPHSABS 1.3  MONOABS 0.7  EOSABS 0.1  BASOSABS 0.0    Chemistries   Recent Labs Lab 04/14/16 1942  NA 138  K 4.7  CL 103  CO2 28  GLUCOSE 108*  BUN 14  CREATININE 1.03  CALCIUM 8.5*   ------------------------------------------------------------------------------------------------------------------ No results for input(s): CHOL, HDL, LDLCALC, TRIG, CHOLHDL, LDLDIRECT in the last 72 hours.  Lab Results  Component Value Date   HGBA1C 5.40 07/15/2015    ------------------------------------------------------------------------------------------------------------------ No results for input(s): TSH, T4TOTAL, T3FREE, THYROIDAB in the last 72 hours.  Invalid input(s): FREET3 ------------------------------------------------------------------------------------------------------------------ No results for input(s): VITAMINB12, FOLATE, FERRITIN, TIBC, IRON, RETICCTPCT in the last 72 hours.  Coagulation profile No results for input(s): INR, PROTIME in the last 168 hours.  No results for input(s): DDIMER in the last 72 hours.  Cardiac Enzymes No results for input(s): CKMB, TROPONINI, MYOGLOBIN in the last 168 hours.  Invalid input(s): CK ------------------------------------------------------------------------------------------------------------------ No results found for: BNP  Inpatient Medications  Scheduled Meds: . citalopram  30 mg Oral Daily  . divalproex  750 mg Oral Daily  . enoxaparin (LOVENOX) injection  40 mg Subcutaneous Q24H  . gabapentin  200 mg Oral TID  . QUEtiapine  200 mg Oral QHS   Continuous Infusions: . sodium chloride    . naLOXone (NARCAN) adult infusion for OVERDOSE     PRN Meds:.albuterol, hydrOXYzine, ondansetron **OR** ondansetron (ZOFRAN) IV  Micro Results No results found for this or any previous visit (from the past 240 hour(s)).  Radiology Reports Dg Chest Portable 1 View  Result Date: 04/14/2016 CLINICAL DATA:  30 y/o  M; possible narcotic overdose and hypoxia. EXAM: PORTABLE CHEST 1 VIEW COMPARISON:  None. FINDINGS: Diffusely increased interstitial markings. Normal cardiac silhouette. Anterior cervical fusion hardware noted. No acute osseous abnormality evident IMPRESSION: Diffusely increased interstitial markings probably represents interstitial edema or ARDS in the setting of narcotic overdose. Electronically Signed   By: Mitzi HansenLance  Furusawa-Stratton M.D.   On: 04/14/2016 19:06   Dg Hand Complete  Left  Result Date: 04/08/2016 CLINICAL DATA:  30 y/o M; smashed left ring finger between his bruit and a lawnmower. Swelling distally. EXAM: LEFT HAND - COMPLETE 3+ VIEW COMPARISON:  None. FINDINGS: There is no evidence of fracture or dislocation. There is no evidence of arthropathy or other focal  bone abnormality. IMPRESSION: No acute fracture or dislocation identified. Electronically Signed   By: Mitzi HansenLance  Furusawa-Stratton M.D.   On: 04/08/2016 04:26     Jaycob Mcclenton M.D on 04/15/2016 at 3:30 PM  Between 7am to 7pm - Pager - [phone]  After 7pm go to [email or web address] - password [email or web address]  [facility] -  Office  [phone]

## 2016-04-15 NOTE — Progress Notes (Addendum)
RN paged because pt is "trying to leave". Spoke with RN. Pt is alert now and doesn't require narcan drip. Told RN that pt is not allowed to leave due to his suicidal ideations and drug OD which brought him to the hospital for admission. RN will ask EDP to IVC pt and if EDP refuses, RN will page this NP back.  Given pt is alert now, doesn't need SDU. Will send to Tele.  KJKG, NP [facility] EDP unable to IVC pt. NP to ED. IVC papers completed with Dr. Toniann FailKakrakandy. Papers signed, given to RN to be faxed to magistrate. NP to pt's room to explain situation as he was wanting to leave. Pt became upset and cursing stating he wasn't a danger to himself now and it is "bullshit" that he has to stay. Explained to pt that he came in for drug OD and verbalized to staff that he didn't want to live anymore. Explained to pt that we need to keep him to ensure his safety. Told pt he would have to stay until seen by psych and plan could be made for further treatment. Pt removing IVs. Security and [employer] called to room.  KJKG, NP [facility]

## 2016-04-15 NOTE — Progress Notes (Signed)
Patient transferred to the unit with security officers and [employer]. Patient verbally aggressive to staff at the time. MD paged and received verbal order for Geodon IM 20 mg.   MD talked with patient and ordered to hold Geodon at this time.

## 2016-04-15 NOTE — ED Notes (Signed)
Pt took of cardiac monitor and b/p cuff, refuses to allow to put it back. Security officers at bedside.

## 2016-04-15 NOTE — ED Notes (Signed)
Pt has pull his IVs out, he is now threatening "to put his hands on somebody", if he's not released. Craige CottaKirby NP, at bedside, IVC paperwork initiated..[name]

## 2016-04-15 NOTE — ED Notes (Signed)
Pt was given coffee and Mal[place]iturkey sandwich per his request. He tolerated food well
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
## 2016-04-15 NOTE — Progress Notes (Signed)
Per physician, pt. Medically stable. LCSWA will assist with patient disposition to inpatient psych. Facility.  Will inform medical staff when bed available.

## 2016-04-15 NOTE — ED Notes (Signed)
Pt has not urinated.  WIl check again in 30 minutes.

## 2016-04-15 NOTE — ED Notes (Signed)
Pt did not

## 2016-04-15 NOTE — ED Notes (Signed)
Pt sts he wants to leave, sts he feels much better now. Explained to him why he can't be discharged at this time, he then sts "Well you have to give me my medicines, mu back hurts".

## 2016-04-16 DIAGNOSIS — T50902A Poisoning by unspecified drugs, medicaments and biological substances, intentional self-harm, initial encounter: Secondary | ICD-10-CM | POA: Diagnosis not present

## 2016-04-16 NOTE — Progress Notes (Addendum)
PROGRESS NOTE                                                                                                                                                                                                             Patient Demographics:    Fred Reynolds, is a 30 y.o. male, DOB - 02-06-1986, ZOX:[id number]  Admit date - 04/14/2016   Admitting Physician Delano Metz, MD  Outpatient Primary MD for the patient is [name], MD  LOS - 2    Chief Complaint  Patient presents with  . Drug Overdose       Brief Narrative   30 y.o. male with history of severe depression, drug addiction, bipolar d/o found unresponsive at home with snoring respirations at his parents house.  Recently released from drug rehab program.  In ED was given IV Narcan low doses x 2 and awakened somewhat.  Has known history of heroin abuse.  STaff says that after awakening some he said that he had been "popping xanax like popcorn", and that he "didn't want to live anymore". Required IV Narcan initially, back to baseline, IV seat as he wanted to leave, seen by psychiatry, who recommended inpatient psych admission  Subjective:    Fred Reynolds today Denies any chest pain, or shortness of breath, denies any suicidal thoughts or ideation,.   Assessment  & Plan :    Principal Problem:   Drug overdose Active Problems:   Bipolar disease, chronic (HCC)   Opioid type dependence, continuous (HCC)   Altered mental status   Somnolence   Suicidal ideation   Wheezing   Asthma  Acute encephalopathy - Secondary to drug overdose, Patient presents with somnolence and lethargy, this is most likely due to benzodiazepine overdose, required Narcan drip initially. - Currently awake alert, back to baseline  Bipolar disorder, with suicidal ideation on presentation - Patient has been IVC, seen by psychiatry, recommendation for inpatient psych admission, awaiting bed availability  History of asthma - No active  wheezing, when necessary meds    Code Status : Full  Family Communication  : none at bedside  Disposition Plan  : Medically cleared for discharge, awaiting inpatient psych availability   Consults  :  Psychiatry  Procedures  : None  DVT Prophylaxis  :  SCDs   Lab Results  Component Value Date   PLT 204 04/14/2016    Antibiotics  :    Anti-infectives    None        Objective:  Vitals:   04/15/16 0542 04/15/16 2043 04/16/16 0519 04/16/16 1402  BP:  (!) 110/45 106/77 104/68  Pulse:  75 77 64  Resp:  16 16 17   Temp:  98 F (36.7 C) 97.7 F (36.5 C) 98.6 F (37 C)  TempSrc:  Oral Oral Oral  SpO2: 95% 90% 95% 96%    Wt Readings from Last 3 Encounters:  03/03/16 110.2 kg (243 lb)  07/15/15 111.8 kg (246 lb 6.4 oz)  11/29/14 104.3 kg (230 lb)     Intake/Output Summary (Last 24 hours) at 04/16/16 1422 Last data filed at 04/15/16 2300  Gross per 24 hour  Intake               30 ml  Output                0 ml  Net               30 ml     Physical Exam  Awake Alert, Oriented X 3,  [place].AT,PERRAL Supple Neck,No JVD, No cervical lymphadenopathy appriciated.  Symmetrical Chest wall movement, Good air movement bilaterally, CTAB RRR,No Gallops,Rubs or new Murmurs, No Parasternal Heave +ve B.Sounds, Abd Soft, No tenderness, No organomegaly appriciated, No rebound - guarding or rigidity. No Cyanosis, Clubbing or edema, No new Rash or bruise      Data Review:    CBC  Recent Labs Lab 04/14/16 1942  WBC 12.3*  HGB 14.6  HCT 43.0  PLT 204  MCV 92.5  MCH 31.4  MCHC 34.0  RDW 14.6  LYMPHSABS 1.3  MONOABS 0.7  EOSABS 0.1  BASOSABS 0.0    Chemistries   Recent Labs Lab 04/14/16 1942  NA 138  K 4.7  CL 103  CO2 28  GLUCOSE 108*  BUN 14  CREATININE 1.03  CALCIUM 8.5*   ------------------------------------------------------------------------------------------------------------------ No results for input(s): CHOL, HDL, LDLCALC, TRIG, CHOLHDL,  LDLDIRECT in the last 72 hours.  Lab Results  Component Value Date   HGBA1C 5.40 07/15/2015   ------------------------------------------------------------------------------------------------------------------ No results for input(s): TSH, T4TOTAL, T3FREE, THYROIDAB in the last 72 hours.  Invalid input(s): FREET3 ------------------------------------------------------------------------------------------------------------------ No results for input(s): VITAMINB12, FOLATE, FERRITIN, TIBC, IRON, RETICCTPCT in the last 72 hours.  Coagulation profile No results for input(s): INR, PROTIME in the last 168 hours.  No results for input(s): DDIMER in the last 72 hours.  Cardiac Enzymes No results for input(s): CKMB, TROPONINI, MYOGLOBIN in the last 168 hours.  Invalid input(s): CK ------------------------------------------------------------------------------------------------------------------ No results found for: BNP  Inpatient Medications  Scheduled Meds: . citalopram  30 mg Oral Daily  . divalproex  750 mg Oral Daily  . enoxaparin (LOVENOX) injection  40 mg Subcutaneous Q24H  . gabapentin  200 mg Oral TID  . QUEtiapine  200 mg Oral QHS   Continuous Infusions: . sodium chloride Stopped (04/15/16 1600)  . naLOXone (NARCAN) adult infusion for OVERDOSE     PRN Meds:.albuterol, hydrOXYzine, ondansetron **OR** ondansetron (ZOFRAN) IV  Micro Results No results found for this or any previous visit (from the past 240 hour(s)).  Radiology Reports Dg Chest Portable 1 View  Result Date: 04/14/2016 CLINICAL DATA:  30 y/o  M; possible narcotic overdose and hypoxia. EXAM: PORTABLE CHEST 1 VIEW COMPARISON:  None. FINDINGS: Diffusely increased interstitial markings. Normal cardiac silhouette. Anterior cervical fusion hardware noted. No acute osseous abnormality evident IMPRESSION: Diffusely increased interstitial markings probably represents interstitial edema or ARDS in the setting of narcotic  overdose. Electronically Signed   By:  Mitzi HansenLance  Furusawa-Stratton M.D.   On: 04/14/2016 19:06   Dg Hand Complete Left  Result Date: 04/08/2016 CLINICAL DATA:  30 y/o M; smashed left ring finger between his bruit and a lawnmower. Swelling distally. EXAM: LEFT HAND - COMPLETE 3+ VIEW COMPARISON:  None. FINDINGS: There is no evidence of fracture or dislocation. There is no evidence of arthropathy or other focal bone abnormality. IMPRESSION: No acute fracture or dislocation identified. Electronically Signed   By: Mitzi HansenLance  Furusawa-Stratton M.D.   On: 04/08/2016 04:26     Reylene Stauder M.D on 04/16/2016 at 2:22 PM  Between 7am to 7pm - Pager - [phone]  After 7pm go to [email or web address] - password [email or web address]  [facility] -  Office  [phone]

## 2016-04-17 DIAGNOSIS — Z811 Family history of alcohol abuse and dependence: Secondary | ICD-10-CM

## 2016-04-17 DIAGNOSIS — F319 Bipolar disorder, unspecified: Secondary | ICD-10-CM | POA: Diagnosis not present

## 2016-04-17 DIAGNOSIS — T50902S Poisoning by unspecified drugs, medicaments and biological substances, intentional self-harm, sequela: Secondary | ICD-10-CM | POA: Diagnosis not present

## 2016-04-17 DIAGNOSIS — Z91030 Bee allergy status: Secondary | ICD-10-CM

## 2016-04-17 DIAGNOSIS — F1120 Opioid dependence, uncomplicated: Secondary | ICD-10-CM | POA: Diagnosis not present

## 2016-04-17 DIAGNOSIS — R400 Somnolence: Secondary | ICD-10-CM

## 2016-04-17 DIAGNOSIS — T424X2A Poisoning by benzodiazepines, intentional self-harm, initial encounter: Secondary | ICD-10-CM | POA: Diagnosis not present

## 2016-04-17 DIAGNOSIS — T1491XA Suicide attempt, initial encounter: Secondary | ICD-10-CM

## 2016-04-17 NOTE — Progress Notes (Addendum)
PROGRESS NOTE                                                                                                                                                                                                             Patient Demographics:    Fred Reynolds, is a 30 y.o. male, DOB - 16-Jun-1985, BJY:[id number]  Admit date - 04/14/2016   Admitting Physician Delano Metzobert Schertz, MD  Outpatient Primary MD for the patient is [name], [name], MD  LOS - 3    Chief Complaint  Patient presents with  . Drug Overdose       Brief Narrative   30 y.o. male with history of severe depression, drug addiction, bipolar d/o found unresponsive at home with snoring respirations at his parents house.  Recently released from drug rehab program.  In ED was given IV Narcan low doses x 2 and awakened somewhat.  Has known history of heroin abuse.  STaff says that after awakening some he said that he had been "popping xanax like popcorn", and that he "didn't want to live anymore". Required IV Narcan initially, back to baseline, IV seat as he wanted to leave, seen by psychiatry, who recommended inpatient psych admission  Subjective:    Fred Reynolds today Denies any chest pain, or shortness of breath, No nausea or vomiting.   Assessment  & Plan :    Principal Problem:   Intentional drug overdose (HCC) Active Problems:   Bipolar disease, chronic (HCC)   Opioid type dependence, continuous (HCC)   Altered mental status   Somnolence   Suicidal ideation   Wheezing   Asthma  Acute encephalopathy - Secondary to drug overdose, Patient presents with somnolence and lethargy, this is most likely due to benzodiazepine overdose, required Narcan drip initially. - Currently awake alert, back to baseline  Bipolar disorder, with suicidal ideation on presentation - Patient has been IVC, seen by psychiatry, recommendation for inpatient psych admission, awaiting bed availability  History of asthma - No active  wheezing,     Code Status : Full  Family Communication  : none at bedside  Disposition Plan  : Medically cleared for discharge, awaiting inpatient psych availability   Consults  :  Psychiatry  Procedures  : None  DVT Prophylaxis  :  SCDs , has been refusing Lovenox.  Lab Results  Component Value Date   PLT 204 04/14/2016    Antibiotics  :    Anti-infectives    None  Objective:   Vitals:   04/15/16 0542 04/15/16 2043 04/16/16 0519 04/16/16 1402  BP:  (!) 110/45 106/77 104/68  Pulse:  75 77 64  Resp:  16 16 17   Temp:   97.7 F (36.5 C) 98.6 F (37 C)  TempSrc:  Oral Oral Oral  SpO2: 95% 90% 95% 96%    Wt Readings from Last 3 Encounters:  03/03/16 110.2 kg (243 lb)  07/15/15 111.8 kg (246 lb 6.4 oz)  11/29/14 104.3 kg (230 lb)     Intake/Output Summary (Last 24 hours) at 04/17/16 1444 Last data filed at 04/17/16 0400  Gross per 24 hour  Intake              240 ml  Output                0 ml  Net              240 ml     Physical Exam  Awake Alert, Oriented X 3,  [place].AT,PERRAL Supple Neck,No JVD, No cervical lymphadenopathy appriciated.  Symmetrical Chest wall movement, Good air movement bilaterally, CTAB RRR,No Gallops,Rubs or new Murmurs, No Parasternal Heave +ve B.Sounds, Abd Soft, No tenderness, No organomegaly appriciated, No rebound - guarding or rigidity. No Cyanosis, Clubbing or edema, No new Rash or bruise      Data Review:    CBC  Recent Labs Lab 04/14/16 1942  WBC 12.3*  HGB 14.6  HCT 43.0  PLT 204  MCV 92.5  MCH 31.4  MCHC 34.0  RDW 14.6  LYMPHSABS 1.3  MONOABS 0.7  EOSABS 0.1  BASOSABS 0.0    Chemistries   Recent Labs Lab 04/14/16 1942  NA 138  K 4.7  CL 103  CO2 28  GLUCOSE 108*  BUN 14  CREATININE 1.03  CALCIUM 8.5*   ------------------------------------------------------------------------------------------------------------------ No results for input(s): CHOL, HDL, LDLCALC, TRIG, CHOLHDL,  LDLDIRECT in the last 72 hours.  Lab Results  Component Value Date   HGBA1C 5.40 07/15/2015   ------------------------------------------------------------------------------------------------------------------ No results for input(s): TSH, T4TOTAL, T3FREE, THYROIDAB in the last 72 hours.  Invalid input(s): FREET3 ------------------------------------------------------------------------------------------------------------------ No results for input(s): VITAMINB12, FOLATE, FERRITIN, TIBC, IRON, RETICCTPCT in the last 72 hours.  Coagulation profile No results for input(s): INR, PROTIME in the last 168 hours.  No results for input(s): DDIMER in the last 72 hours.  Cardiac Enzymes No results for input(s): CKMB, TROPONINI, MYOGLOBIN in the last 168 hours.  Invalid input(s): CK ------------------------------------------------------------------------------------------------------------------ No results found for: BNP  Inpatient Medications  Scheduled Meds: . citalopram  30 mg Oral Daily  . divalproex  750 mg Oral Daily  . enoxaparin (LOVENOX) injection  40 mg Subcutaneous Q24H  . gabapentin  200 mg Oral TID  . QUEtiapine  200 mg Oral QHS   Continuous Infusions: . sodium chloride Stopped (04/15/16 1600)   PRN Meds:.albuterol, hydrOXYzine, ondansetron **OR** ondansetron (ZOFRAN) IV  Micro Results No results found for this or any previous visit (from the past 240 hour(s)).  Radiology Reports Dg Chest Portable 1 View  Result Date: 04/14/2016 CLINICAL DATA:  30 y/o  M; possible narcotic overdose and hypoxia. EXAM: PORTABLE CHEST 1 VIEW COMPARISON:  None. FINDINGS: Diffusely increased interstitial markings. Normal cardiac silhouette. Anterior cervical fusion hardware noted. No acute osseous abnormality evident IMPRESSION: Diffusely increased interstitial markings probably represents interstitial edema or ARDS in the setting of narcotic overdose. Electronically Signed   By: Mitzi HansenLance   Furusawa-Stratton M.D.   On: 04/14/2016 19:06   Dg  Hand Complete Left  Result Date: 04/08/2016 CLINICAL DATA:  30 y/o M; smashed left ring finger between his bruit and a lawnmower. Swelling distally. EXAM: LEFT HAND - COMPLETE 3+ VIEW COMPARISON:  None. FINDINGS: There is no evidence of fracture or dislocation. There is no evidence of arthropathy or other focal bone abnormality. IMPRESSION: No acute fracture or dislocation identified. Electronically Signed   By: Mitzi Hansen M.D.   On: 04/08/2016 04:26     Draco Malczewski M.D on 04/17/2016 at 2:44 PM  Between 7am to 7pm - Pager - [phone]  After 7pm go to [email or web address] - password [facility]  [facility] -  Office  [phone]

## 2016-04-17 NOTE — Progress Notes (Signed)
Re-Faxed clinical information to Psych. [facility] Will inform medical staff when bed is available.  Vivi BarrackNicole Najee Cowens, Theresia MajorsLCSWA, MSW Clinical Social Worker 5E and Psychiatric Service Line [phone] 04/17/2016  10:00 AM

## 2016-04-17 NOTE — Progress Notes (Signed)
3:00pm, [facility]

## 2016-04-17 NOTE — Progress Notes (Addendum)
[facility] referral completed. [facility], waiting on follow up call from nurse for [facility].

## 2016-04-17 NOTE — Progress Notes (Signed)
Pt continuing to refuse tele, v/s, all VTE, and to wear ID bracelet. Patient continues to be extremely agitated and uncooperative with staff. Pt states "you don't know how much I want to run up and down the hall breaking everything I see and to choke everyone that passes." This RN updated security on patient's status in case they are needed at a later point. Will continue to monitor to the best of our abilities.
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
## 2016-04-18 DIAGNOSIS — T50902S Poisoning by unspecified drugs, medicaments and biological substances, intentional self-harm, sequela: Secondary | ICD-10-CM | POA: Diagnosis not present

## 2016-04-18 MED ORDER — ENOXAPARIN SODIUM 40 MG/0.4ML ~~LOC~~ SOLN
40.0000 mg | SUBCUTANEOUS | Status: DC
  Administered 2016-04-18 – 2016-04-20 (×3): 40 mg via SUBCUTANEOUS
  Filled 2016-04-18 (×3): qty 0.4

## 2016-04-18 NOTE — Progress Notes (Signed)
PROGRESS NOTE                                                                                                                                                                                                             Patient Demographics:    Fred Reynolds, is a 30 y.o. male, DOB - 09-May-1986, JYN:[id number]  Admit date - 04/14/2016   Admitting Physician Delano Metz, MD  Outpatient Primary MD for the patient is [name], MD  LOS - 4    Chief Complaint  Patient presents with  . Drug Overdose       Brief Narrative   30 y.o. male with history of severe depression, drug addiction, bipolar d/o found unresponsive at home with snoring respirations at his parents house.  Recently released from drug rehab program.  In ED was given IV Narcan low doses x 2 and awakened somewhat.  Has known history of heroin abuse.  STaff says that after awakening some he said that he had been "popping xanax like popcorn", and that he "didn't want to live anymore". Required IV Narcan initially, back to baseline, IV seat as he wanted to leave, seen by psychiatry, who recommended inpatient psych admission  Subjective:    Fred Reynolds today Denies any chest pain, or shortness of breath, No nausea or vomiting.   Assessment  & Plan :    Principal Problem:   Intentional drug overdose (HCC) Active Problems:   Bipolar disease, chronic (HCC)   Opioid type dependence, continuous (HCC)   Altered mental status   Somnolence   Suicidal ideation   Wheezing   Asthma  Acute encephalopathy - Secondary to drug overdose, Patient presents with somnolence and lethargy, this is most likely due to benzodiazepine overdose, required Narcan drip initially. - Currently awake alert, back to baseline  Bipolar disorder, with suicidal ideation on presentation - Patient has been IVC, seen by psychiatry, recommendation for inpatient psych admission, awaiting bed availability  History of asthma - No active  wheezing,     Code Status : Full  Family Communication  : none at bedside  Disposition Plan  : Medically cleared for discharge, awaiting inpatient psych availability   Consults  :  Psychiatry  Procedures  : None  DVT Prophylaxis  : Lovenox  Lab Results  Component Value Date   PLT 204 04/14/2016    Antibiotics  :    Anti-infectives    None        Objective:   Vitals:  04/15/16 2043 04/16/16 0519 04/16/16 1402 04/18/16 0552  BP: (!) 110/45 106/77 104/68 112/61  Pulse: 75 77 64 (!) 56  Resp: 16 16 17 18   Temp:    97.8 F (36.6 C)  TempSrc: Oral Oral Oral Oral  SpO2: 90% 95% 96% 94%    Wt Readings from Last 3 Encounters:  03/03/16 110.2 kg (243 lb)  07/15/15 111.8 kg (246 lb 6.4 oz)  11/29/14 104.3 kg (230 lb)     Intake/Output Summary (Last 24 hours) at 04/18/16 1246 Last data filed at 04/18/16 0800  Gross per 24 hour  Intake           692.54 ml  Output                0 ml  Net           692.54 ml     Physical Exam  Awake Alert, Oriented X 3,  [place].AT,PERRAL Supple Neck,No JVD, No cervical lymphadenopathy appriciated.  Symmetrical Chest wall movement, Good air movement bilaterally, CTAB RRR,No Gallops,Rubs or new Murmurs, No Parasternal Heave +ve B.Sounds, Abd Soft, No tenderness, No organomegaly appriciated, No rebound - guarding or rigidity. No Cyanosis, Clubbing or edema, No new Rash or bruise      Data Review:    CBC  Recent Labs Lab 04/14/16 1942  WBC 12.3*  HGB 14.6  HCT 43.0  PLT 204  MCV 92.5  MCH 31.4  MCHC 34.0  RDW 14.6  LYMPHSABS 1.3  MONOABS 0.7  EOSABS 0.1  BASOSABS 0.0    Chemistries   Recent Labs Lab 04/14/16 1942  NA 138  K 4.7  CL 103  CO2 28  GLUCOSE 108*  BUN 14  CREATININE 1.03  CALCIUM 8.5*   ------------------------------------------------------------------------------------------------------------------ No results for input(s): CHOL, HDL, LDLCALC, TRIG, CHOLHDL, LDLDIRECT in the last 72  hours.  Lab Results  Component Value Date   HGBA1C 5.40 07/15/2015   ------------------------------------------------------------------------------------------------------------------ No results for input(s): TSH, T4TOTAL, T3FREE, THYROIDAB in the last 72 hours.  Invalid input(s): FREET3 ------------------------------------------------------------------------------------------------------------------ No results for input(s): VITAMINB12, FOLATE, FERRITIN, TIBC, IRON, RETICCTPCT in the last 72 hours.  Coagulation profile No results for input(s): INR, PROTIME in the last 168 hours.  No results for input(s): DDIMER in the last 72 hours.  Cardiac Enzymes No results for input(s): CKMB, TROPONINI, MYOGLOBIN in the last 168 hours.  Invalid input(s): CK ------------------------------------------------------------------------------------------------------------------ No results found for: BNP  Inpatient Medications  Scheduled Meds: . citalopram  30 mg Oral Daily  . divalproex  750 mg Oral Daily  . gabapentin  200 mg Oral TID  . QUEtiapine  200 mg Oral QHS   Continuous Infusions:  PRN Meds:.albuterol, hydrOXYzine, ondansetron **OR** ondansetron (ZOFRAN) IV  Micro Results No results found for this or any previous visit (from the past 240 hour(s)).  Radiology Reports Dg Chest Portable 1 View  Result Date: 04/14/2016 CLINICAL DATA:  30 y/o  M; possible narcotic overdose and hypoxia. EXAM: PORTABLE CHEST 1 VIEW COMPARISON:  None. FINDINGS: Diffusely increased interstitial markings. Normal cardiac silhouette. Anterior cervical fusion hardware noted. No acute osseous abnormality evident IMPRESSION: Diffusely increased interstitial markings probably represents interstitial edema or ARDS in the setting of narcotic overdose. Electronically Signed   By: Mitzi HansenLance  Furusawa-Stratton M.D.   On: 04/14/2016 19:06   Dg Hand Complete Left  Result Date: 04/08/2016 CLINICAL DATA:  30 y/o M; smashed  left ring finger between his bruit and a lawnmower. Swelling distally. EXAM: LEFT HAND - COMPLETE 3+  VIEW COMPARISON:  None. FINDINGS: There is no evidence of fracture or dislocation. There is no evidence of arthropathy or other focal bone abnormality. IMPRESSION: No acute fracture or dislocation identified. Electronically Signed   By: Mitzi HansenLance  Furusawa-Stratton M.D.   On: 04/08/2016 04:26     Chelci Wintermute M.D on 04/18/2016 at 12:46 PM  Between 7am to 7pm - Pager - [phone]  After 7pm go to [email or web address] - password [email or web address]  [facility] -  Office  [phone]

## 2016-04-18 NOTE — Progress Notes (Signed)
CSW contacted [facility] [phone] to ensure that patient is on the waitlist. [facility] reported that patient is on the waitlist. [facility] to inquire about bed availability. Staff reported that they do have bed availability today. [facility], staff member reported that they do have beds but is unaware if their physician will be admitting from outside. Staff member reported that CSW could fax referrals. CSW fax[facility], [facility]. CSW contacted the following the facilities that reported being at capacity: [address], [address] [place], [facility], [facility]. [facility] reported that they only have 1 geri psych bed available.

## 2016-04-18 NOTE — Progress Notes (Signed)
[facility] to inquire about patient's referral. Staff reported that the facility has been busy and has not had an opportunity to review referral. Staff informed CSW that night shift usually reviews referrals and CSW would receive return phone call if the patient was being considered for admission.

## 2016-04-19 DIAGNOSIS — T50902S Poisoning by unspecified drugs, medicaments and biological substances, intentional self-harm, sequela: Secondary | ICD-10-CM | POA: Diagnosis not present

## 2016-04-19 NOTE — Progress Notes (Signed)
PROGRESS NOTE                                                                                                                                                                                                             Patient Demographics:    Fred Reynolds, is a 30 y.o. male, DOB - December 25, 1985, VWU:[id number]  Admit date - 04/14/2016   Admitting Physician Delano Metz, MD  Outpatient Primary MD for the patient is [name], MD  LOS - 5    Chief Complaint  Patient presents with  . Drug Overdose       Brief Narrative   30 y.o. male with history of severe depression, drug addiction, bipolar d/o found unresponsive at home with snoring respirations at his parents house.  Recently released from drug rehab program.  In ED was given IV Narcan low doses x 2 and awakened somewhat.  Has known history of heroin abuse.  STaff says that after awakening some he said that he had been "popping xanax like popcorn", and that he "didn't want to live anymore". Required IV Narcan initially, back to baseline, IV seat as he wanted to leave, seen by psychiatry, who recommended inpatient psych admission  Subjective:    Fred Reynolds today Denies any chest pain, or shortness of breath, No nausea or vomiting.   Assessment  & Plan :    Principal Problem:   Intentional drug overdose (HCC) Active Problems:   Bipolar disease, chronic (HCC)   Opioid type dependence, continuous (HCC)   Altered mental status   Somnolence   Suicidal ideation   Wheezing   Asthma  Acute encephalopathy - Secondary to drug overdose, Patient presents with somnolence and lethargy, this is most likely due to benzodiazepine overdose, required Narcan drip initially. - Currently awake alert, back to baseline  Bipolar disorder, with suicidal ideation on presentation - Patient has been IVC, seen by psychiatry, recommendation for inpatient psych admission, awaiting bed availability  History of asthma - No active  wheezing,     Code Status : Full  Family Communication  : none at bedside  Disposition Plan  : Medically cleared for discharge, awaiting inpatient psych availability   Consults  :  Psychiatry  Procedures  : None  DVT Prophylaxis  : Lovenox  Lab Results  Component Value Date   PLT 204 04/14/2016    Antibiotics  :    Anti-infectives    None        Objective:   Vitals:  04/16/16 1402 04/18/16 0552 04/18/16 1451 04/18/16 2212  BP: 104/68 112/61 (!) 116/47 (!) 127/44  Pulse: 64 (!) 56 60 69  Resp: 17 18  16   Temp:  97.8 F (36.6 C) 98.8 F (37.1 C) 98.3 F (36.8 C)  TempSrc: Oral Oral Oral Oral  SpO2: 96% 94% 97% 95%    Wt Readings from Last 3 Encounters:  03/03/16 110.2 kg (243 lb)  07/15/15 111.8 kg (246 lb 6.4 oz)  11/29/14 104.3 kg (230 lb)     Intake/Output Summary (Last 24 hours) at 04/19/16 1147 Last data filed at 04/18/16 1610  Gross per 24 hour  Intake              240 ml  Output                0 ml  Net              240 ml     Physical Exam  Awake Alert, Oriented X 3,  [place].AT,PERRAL Supple Neck,No JVD, No cervical lymphadenopathy appriciated.  Symmetrical Chest wall movement, Good air movement bilaterally, CTAB RRR,No Gallops,Rubs or new Murmurs, No Parasternal Heave +ve B.Sounds, Abd Soft, No tenderness, No organomegaly appriciated, No rebound - guarding or rigidity. No Cyanosis, Clubbing or edema, No new Rash or bruise      Data Review:    CBC  Recent Labs Lab 04/14/16 1942  WBC 12.3*  HGB 14.6  HCT 43.0  PLT 204  MCV 92.5  MCH 31.4  MCHC 34.0  RDW 14.6  LYMPHSABS 1.3  MONOABS 0.7  EOSABS 0.1  BASOSABS 0.0    Chemistries   Recent Labs Lab 04/14/16 1942  NA 138  K 4.7  CL 103  CO2 28  GLUCOSE 108*  BUN 14  CREATININE 1.03  CALCIUM 8.5*   ------------------------------------------------------------------------------------------------------------------ No results for input(s): CHOL, HDL, LDLCALC, TRIG,  CHOLHDL, LDLDIRECT in the last 72 hours.  Lab Results  Component Value Date   HGBA1C 5.40 07/15/2015   ------------------------------------------------------------------------------------------------------------------ No results for input(s): TSH, T4TOTAL, T3FREE, THYROIDAB in the last 72 hours.  Invalid input(s): FREET3 ------------------------------------------------------------------------------------------------------------------ No results for input(s): VITAMINB12, FOLATE, FERRITIN, TIBC, IRON, RETICCTPCT in the last 72 hours.  Coagulation profile No results for input(s): INR, PROTIME in the last 168 hours.  No results for input(s): DDIMER in the last 72 hours.  Cardiac Enzymes No results for input(s): CKMB, TROPONINI, MYOGLOBIN in the last 168 hours.  Invalid input(s): CK ------------------------------------------------------------------------------------------------------------------ No results found for: BNP  Inpatient Medications  Scheduled Meds: . citalopram  30 mg Oral Daily  . divalproex  750 mg Oral Daily  . enoxaparin (LOVENOX) injection  40 mg Subcutaneous Q24H  . gabapentin  200 mg Oral TID  . QUEtiapine  200 mg Oral QHS   Continuous Infusions:  PRN Meds:.albuterol, hydrOXYzine, ondansetron **OR** ondansetron (ZOFRAN) IV  Micro Results No results found for this or any previous visit (from the past 240 hour(s)).  Radiology Reports Dg Chest Portable 1 View  Result Date: 04/14/2016 CLINICAL DATA:  30 y/o  M; possible narcotic overdose and hypoxia. EXAM: PORTABLE CHEST 1 VIEW COMPARISON:  None. FINDINGS: Diffusely increased interstitial markings. Normal cardiac silhouette. Anterior cervical fusion hardware noted. No acute osseous abnormality evident IMPRESSION: Diffusely increased interstitial markings probably represents interstitial edema or ARDS in the setting of narcotic overdose. Electronically Signed   By: Mitzi HansenLance  Furusawa-Stratton M.D.   On: 04/14/2016  19:06   Dg Hand Complete Left  Result Date: 04/08/2016 CLINICAL  DATA:  30 y/o M; smashed left ring finger between his bruit and a lawnmower. Swelling distally. EXAM: LEFT HAND - COMPLETE 3+ VIEW COMPARISON:  None. FINDINGS: There is no evidence of fracture or dislocation. There is no evidence of arthropathy or other focal bone abnormality. IMPRESSION: No acute fracture or dislocation identified. Electronically Signed   By: Mitzi HansenLance  Furusawa-Stratton M.D.   On: 04/08/2016 04:26     Reeve Mallo M.D on 04/19/2016 at 11:47 AM  Between 7am to 7pm - Pager - [phone]  After 7pm go to [email or web address] - password [email or web address]  [facility] -  Office  [phone]

## 2016-04-20 DIAGNOSIS — T50902S Poisoning by unspecified drugs, medicaments and biological substances, intentional self-harm, sequela: Secondary | ICD-10-CM | POA: Diagnosis not present

## 2016-04-20 NOTE — Progress Notes (Signed)
PROGRESS NOTE                                                                                                                                                                                                             Patient Demographics:    Fred Reynolds, is a 30 y.o. male, DOB - February 17, 1986, [id number]  Admit date - 04/14/2016   Admitting Physician Delano Metz, MD  Outpatient Primary MD for the patient is [name], MD  LOS - 6    Chief Complaint  Patient presents with  . Drug Overdose       Brief Narrative   30 y.o. male with history of severe depression, drug addiction, bipolar d/o found unresponsive at home with snoring respirations at his parents house.  Recently released from drug rehab program.  In ED was given IV Narcan low doses x 2 and awakened somewhat.  Has known history of heroin abuse.  STaff says that after awakening some he said that he had been "popping xanax like popcorn", and that he "didn't want to live anymore". Required IV Narcan initially, back to baseline, IVCed as he wanted to leave, seen by psychiatry, who recommended inpatient psych admission, still awaiting inpatient bed psych availability.  Subjective:    Saleem Coccia today Denies any chest pain, or shortness of breath, No nausea or vomiting.Asking if she can go home.   Assessment  & Plan :    Principal Problem:   Intentional drug overdose (HCC) Active Problems:   Bipolar disease, chronic (HCC)   Opioid type dependence, continuous (HCC)   Altered mental status   Somnolence   Suicidal ideation   Wheezing   Asthma  Acute encephalopathy - Secondary to drug overdose, Patient presents with somnolence and lethargy, this is most likely due to benzodiazepine overdose, required Narcan drip initially. - Currently awake alert, back to baseline  Bipolar disorder, with suicidal ideation on presentation - Patient has been IVC, seen by psychiatry, recommendation for inpatient psych  admission, awaiting bed availability  History of asthma - No active wheezing,     Code Status : Full  Family Communication  : none at bedside  Disposition Plan  : Medically cleared for discharge, awaiting inpatient psych availability   Consults  :  Psychiatry  Procedures  : None  DVT Prophylaxis  : Lovenox  Lab Results  Component Value Date   PLT 204 04/14/2016    Antibiotics  :    Anti-infectives    None  Objective:   Vitals:   04/18/16 2212 04/19/16 1500 04/19/16 2133 04/20/16 0624  BP: (!) 127/44 114/60 (!) 116/51 (!) 103/43  Pulse: 69 70 68 (!) 58  Resp: 16 17 16 16   Temp: 98.3 F (36.8 C) 98.4 F (36.9 C) 97.9 F (36.6 C) 97.9 F (36.6 C)  TempSrc: Oral Oral Oral Oral  SpO2: 95% 96% 96% 94%    Wt Readings from Last 3 Encounters:  03/03/16 110.2 kg (243 lb)  07/15/15 111.8 kg (246 lb 6.4 oz)  11/29/14 104.3 kg (230 lb)     Intake/Output Summary (Last 24 hours) at 04/20/16 1114 Last data filed at 04/20/16 16100627  Gross per 24 hour  Intake             1140 ml  Output                0 ml  Net             1140 ml     Physical Exam  Awake Alert, Oriented X 3,  [place].AT,PERRAL Supple Neck,No JVD, No cervical lymphadenopathy appriciated.  Symmetrical Chest wall movement, Good air movement bilaterally, CTAB RRR,No Gallops,Rubs or new Murmurs, No Parasternal Heave +ve B.Sounds, Abd Soft, No tenderness, No organomegaly appriciated, No rebound - guarding or rigidity. No Cyanosis, Clubbing or edema, No new Rash or bruise      Data Review:    CBC  Recent Labs Lab 04/14/16 1942  WBC 12.3*  HGB 14.6  HCT 43.0  PLT 204  MCV 92.5  MCH 31.4  MCHC 34.0  RDW 14.6  LYMPHSABS 1.3  MONOABS 0.7  EOSABS 0.1  BASOSABS 0.0    Chemistries   Recent Labs Lab 04/14/16 1942  NA 138  K 4.7  CL 103  CO2 28  GLUCOSE 108*  BUN 14  CREATININE 1.03  CALCIUM 8.5*    ------------------------------------------------------------------------------------------------------------------ No results for input(s): CHOL, HDL, LDLCALC, TRIG, CHOLHDL, LDLDIRECT in the last 72 hours.  Lab Results  Component Value Date   HGBA1C 5.40 07/15/2015   ------------------------------------------------------------------------------------------------------------------ No results for input(s): TSH, T4TOTAL, T3FREE, THYROIDAB in the last 72 hours.  Invalid input(s): FREET3 ------------------------------------------------------------------------------------------------------------------ No results for input(s): VITAMINB12, FOLATE, FERRITIN, TIBC, IRON, RETICCTPCT in the last 72 hours.  Coagulation profile No results for input(s): INR, PROTIME in the last 168 hours.  No results for input(s): DDIMER in the last 72 hours.  Cardiac Enzymes No results for input(s): CKMB, TROPONINI, MYOGLOBIN in the last 168 hours.  Invalid input(s): CK ------------------------------------------------------------------------------------------------------------------ No results found for: BNP  Inpatient Medications  Scheduled Meds: . citalopram  30 mg Oral Daily  . divalproex  750 mg Oral Daily  . enoxaparin (LOVENOX) injection  40 mg Subcutaneous Q24H  . gabapentin  200 mg Oral TID  . QUEtiapine  200 mg Oral QHS   Continuous Infusions:  PRN Meds:.albuterol, hydrOXYzine, ondansetron **OR** ondansetron (ZOFRAN) IV  Micro Results No results found for this or any previous visit (from the past 240 hour(s)).  Radiology Reports Dg Chest Portable 1 View  Result Date: 04/14/2016 CLINICAL DATA:  30 y/o  M; possible narcotic overdose and hypoxia. EXAM: PORTABLE CHEST 1 VIEW COMPARISON:  None. FINDINGS: Diffusely increased interstitial markings. Normal cardiac silhouette. Anterior cervical fusion hardware noted. No acute osseous abnormality evident IMPRESSION: Diffusely increased interstitial  markings probably represents interstitial edema or ARDS in the setting of narcotic overdose. Electronically Signed   By: Mitzi HansenLance  Furusawa-Stratton M.D.   On: 04/14/2016 19:06   Dg  Hand Complete Left  Result Date: 04/08/2016 CLINICAL DATA:  30 y/o M; smashed left ring finger between his bruit and a lawnmower. Swelling distally. EXAM: LEFT HAND - COMPLETE 3+ VIEW COMPARISON:  None. FINDINGS: There is no evidence of fracture or dislocation. There is no evidence of arthropathy or other focal bone abnormality. IMPRESSION: No acute fracture or dislocation identified. Electronically Signed   By: Mitzi HansenLance  Furusawa-Stratton M.D.   On: 04/08/2016 04:26     Taiylor Virden M.D on 04/20/2016 at 11:14 AM  Between 7am to 7pm - Pager - [phone]  After 7pm go to [email or web address] - password [email or web address]  [facility] -  Office  [phone]

## 2016-04-20 NOTE — Progress Notes (Signed)
Called and [facility].

## 2016-04-21 DIAGNOSIS — T424X2A Poisoning by benzodiazepines, intentional self-harm, initial encounter: Secondary | ICD-10-CM | POA: Diagnosis not present

## 2016-04-21 DIAGNOSIS — Z811 Family history of alcohol abuse and dependence: Secondary | ICD-10-CM | POA: Diagnosis not present

## 2016-04-21 DIAGNOSIS — F319 Bipolar disorder, unspecified: Secondary | ICD-10-CM | POA: Diagnosis not present

## 2016-04-21 DIAGNOSIS — T50902S Poisoning by unspecified drugs, medicaments and biological substances, intentional self-harm, sequela: Secondary | ICD-10-CM | POA: Diagnosis not present

## 2016-04-21 DIAGNOSIS — F1120 Opioid dependence, uncomplicated: Secondary | ICD-10-CM | POA: Diagnosis not present

## 2016-04-21 LAB — BASIC METABOLIC PANEL
ANION GAP: 6 (ref 5–15)
BUN: 13 mg/dL (ref 6–20)
CO2: 27 mmol/L (ref 22–32)
Calcium: 8.6 mg/dL — ABNORMAL LOW (ref 8.9–10.3)
Chloride: 105 mmol/L (ref 101–111)
Creatinine, Ser: 0.88 mg/dL (ref 0.61–1.24)
GLUCOSE: 104 mg/dL — AB (ref 65–99)
POTASSIUM: 3.8 mmol/L (ref 3.5–5.1)
SODIUM: 138 mmol/L (ref 135–145)

## 2016-04-21 LAB — CBC
HCT: 42.5 % (ref 39.0–52.0)
Hemoglobin: 14.5 g/dL (ref 13.0–17.0)
MCH: 31 pg (ref 26.0–34.0)
MCHC: 34.1 g/dL (ref 30.0–36.0)
MCV: 91 fL (ref 78.0–100.0)
PLATELETS: 177 10*3/uL (ref 150–400)
RBC: 4.67 MIL/uL (ref 4.22–5.81)
RDW: 14.4 % (ref 11.5–15.5)
WBC: 6.6 10*3/uL (ref 4.0–10.5)
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
## 2016-04-21 NOTE — Discharge Instructions (Signed)
Follow with Primary MD [name], [name], MD in 7 days   Get CBC, CMP, 2 view Chest X ray checked  by Primary MD next visit.    Activity: As tolerated   Disposition Home   Diet: Regular diet.   On your next visit with your primary care physician please Get Medicines reviewed and adjusted.   Please request your Prim.MD to go over all Hospital Tests and Procedure/Radiological results at the follow up, please get all Hospital records sent to your Prim MD by signing hospital release before you go home.   If you experience worsening of your admission symptoms, develop shortness of breath, life threatening emergency, suicidal or homicidal thoughts you must seek medical attention immediately by calling 911 or calling your MD immediately  if symptoms less severe.  You Must read complete instructions/literature along with all the possible adverse reactions/side effects for all the Medicines you take and that have been prescribed to you. Take any new Medicines after you have completely understood and accpet all the possible adverse reactions/side effects.   Do not drive, operating heavy machinery, perform activities at heights, swimming or participation in water activities or provide baby sitting services if your were admitted for syncope or siezures until you have seen by Primary MD or a Neurologist and advised to do so again.  Do not drive when taking Pain medications.    Do not take more than prescribed Pain, Sleep and Anxiety Medications  Special Instructions: If you have smoked or chewed Tobacco  in the last 2 yrs please stop smoking, stop any regular Alcohol  and or any Recreational drug use.  Wear Seat belts while driving.   Please note  You were cared for by a hospitalist during your hospital stay. If you have any questions about your discharge medications or the care you received while you were in the hospital after you are discharged, you can call the unit and asked to speak with  the hospitalist on call if the hospitalist that took care of you is not available. Once you are discharged, your primary care physician will handle any further medical issues. Please note that NO REFILLS for any discharge medications will be authorized once you are discharged, as it is imperative that you return to your primary care physician (or establish a relationship with a primary care physician if you do not have one) for your aftercare needs so that they can reassess your need for medications and monitor your lab values.

## 2016-04-21 NOTE — Progress Notes (Signed)
LCSWA spoke extensively with patient and family by phone to go over patient safety plan. Patient and parents discussed [facility], pt. Understands appointment is Monday, with Erin, 7:45am. The pt. Parents expressed their concerns with his substance abuse and the difficulties it is causing the family. The patient expressed his search to find a halfway house, and [name]. Patient parents are agreeable to transport him home.  LCSWA faxed information. Safety plan Given to patient.  [name], Theresia MajorsLCSWA, MSW Clinical Social Worker 5E and Psychiatric Service Line [phone] 04/21/2016  4:47 PM
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
## 2016-04-21 NOTE — Progress Notes (Signed)
Went over d/c instructions with patient.  Patient verbalized understanding.  Patient given belongings.  He left hospital escorted by staff to personal vehicle.

## 2016-04-28 ENCOUNTER — Inpatient Hospital Stay (HOSPITAL_COMMUNITY): Payer: Self-pay

## 2016-04-28 ENCOUNTER — Encounter (HOSPITAL_COMMUNITY): Payer: Self-pay

## 2016-04-28 ENCOUNTER — Emergency Department (HOSPITAL_COMMUNITY): Payer: Self-pay

## 2016-04-28 ENCOUNTER — Inpatient Hospital Stay (HOSPITAL_COMMUNITY)
Admission: EM | Admit: 2016-04-28 | Discharge: 2016-04-30 | DRG: 871 | Disposition: A | Discharge status: Home / Self Care | Payer: Federal, State, Local not specified - Other | Attending: Internal Medicine | Admitting: Internal Medicine

## 2016-04-28 DIAGNOSIS — F1110 Opioid abuse, uncomplicated: Secondary | ICD-10-CM | POA: Diagnosis present

## 2016-04-28 DIAGNOSIS — G9340 Encephalopathy, unspecified: Secondary | ICD-10-CM | POA: Diagnosis present

## 2016-04-28 DIAGNOSIS — J9601 Acute respiratory failure with hypoxia: Secondary | ICD-10-CM | POA: Diagnosis present

## 2016-04-28 DIAGNOSIS — F319 Bipolar disorder, unspecified: Secondary | ICD-10-CM | POA: Diagnosis present

## 2016-04-28 DIAGNOSIS — J690 Pneumonitis due to inhalation of food and vomit: Secondary | ICD-10-CM | POA: Diagnosis present

## 2016-04-28 DIAGNOSIS — Z79899 Other long term (current) drug therapy: Secondary | ICD-10-CM

## 2016-04-28 DIAGNOSIS — A419 Sepsis, unspecified organism: Principal | ICD-10-CM | POA: Diagnosis present

## 2016-04-28 DIAGNOSIS — T50901A Poisoning by unspecified drugs, medicaments and biological substances, accidental (unintentional), initial encounter: Secondary | ICD-10-CM

## 2016-04-28 DIAGNOSIS — R739 Hyperglycemia, unspecified: Secondary | ICD-10-CM | POA: Diagnosis present

## 2016-04-28 DIAGNOSIS — Z789 Other specified health status: Secondary | ICD-10-CM

## 2016-04-28 DIAGNOSIS — Z6841 Body Mass Index (BMI) 40.0 and over, adult: Secondary | ICD-10-CM

## 2016-04-28 DIAGNOSIS — Z452 Encounter for adjustment and management of vascular access device: Secondary | ICD-10-CM

## 2016-04-28 DIAGNOSIS — E875 Hyperkalemia: Secondary | ICD-10-CM | POA: Diagnosis present

## 2016-04-28 DIAGNOSIS — R6521 Severe sepsis with septic shock: Secondary | ICD-10-CM | POA: Diagnosis present

## 2016-04-28 DIAGNOSIS — N179 Acute kidney failure, unspecified: Secondary | ICD-10-CM | POA: Diagnosis present

## 2016-04-28 DIAGNOSIS — F17210 Nicotine dependence, cigarettes, uncomplicated: Secondary | ICD-10-CM | POA: Diagnosis present

## 2016-04-28 DIAGNOSIS — E872 Acidosis: Secondary | ICD-10-CM | POA: Diagnosis present

## 2016-04-28 DIAGNOSIS — G4733 Obstructive sleep apnea (adult) (pediatric): Secondary | ICD-10-CM | POA: Diagnosis present

## 2016-04-28 DIAGNOSIS — T401X1A Poisoning by heroin, accidental (unintentional), initial encounter: Secondary | ICD-10-CM | POA: Diagnosis present

## 2016-04-28 LAB — BLOOD GAS, ARTERIAL
ACID-BASE DEFICIT: 3 mmol/L — AB (ref 0.0–2.0)
ACID-BASE DEFICIT: 2.5 mmol/L — AB (ref 0.0–2.0)
BICARBONATE: 23 mmol/L (ref 20.0–28.0)
BICARBONATE: 21.7 mmol/L (ref 20.0–28.0)
DRAWN BY: 103701
Drawn by: 257701
FIO2: 100
FIO2: 100
LHR: 15 {breaths}/min
O2 SAT: 97.9 %
O2 SAT: 97.6 %
PATIENT TEMPERATURE: 98.6
PCO2 ART: 46.2 mmHg (ref 32.0–48.0)
PCO2 ART: 37.6 mmHg (ref 32.0–48.0)
PEEP/CPAP: 5 cmH2O
PH ART: 7.379 (ref 7.350–7.450)
PO2 ART: 122 mmHg — AB (ref 83.0–108.0)
PRESSURE CONTROL: 8 cmH2O
Patient temperature: 37
pH, Arterial: 7.318 — ABNORMAL LOW (ref 7.350–7.450)
pO2, Arterial: 109 mmHg — ABNORMAL HIGH (ref 83.0–108.0)

## 2016-04-28 LAB — URINALYSIS, ROUTINE W REFLEX MICROSCOPIC
BILIRUBIN URINE: NEGATIVE
GLUCOSE, UA: NEGATIVE mg/dL
KETONES UR: NEGATIVE mg/dL
Leukocytes, UA: NEGATIVE
NITRITE: NEGATIVE
PH: 5 (ref 5.0–8.0)
PROTEIN: 30 mg/dL — AB
Specific Gravity, Urine: 1.021 (ref 1.005–1.030)

## 2016-04-28 LAB — BASIC METABOLIC PANEL
Anion gap: 8 (ref 5–15)
BUN: 22 mg/dL — AB (ref 6–20)
CALCIUM: 7.9 mg/dL — AB (ref 8.9–10.3)
CHLORIDE: 107 mmol/L (ref 101–111)
CO2: 22 mmol/L (ref 22–32)
CREATININE: 1.27 mg/dL — AB (ref 0.61–1.24)
GFR calc non Af Amer: >60 mL/min (ref >60)
Glucose, Bld: 150 mg/dL — ABNORMAL HIGH (ref 65–99)
Potassium: 5.3 mmol/L — ABNORMAL HIGH (ref 3.5–5.1)
SODIUM: 137 mmol/L (ref 135–145)

## 2016-04-28 LAB — CBC WITH DIFFERENTIAL/PLATELET
Basophils Absolute: 0 10*3/uL (ref 0.0–0.1)
Basophils Relative: 0 %
Eosinophils Absolute: 0 10*3/uL (ref 0.0–0.7)
Eosinophils Relative: 0 %
HEMATOCRIT: 46.9 % (ref 39.0–52.0)
HEMOGLOBIN: 16.1 g/dL (ref 13.0–17.0)
LYMPHS ABS: 0.8 10*3/uL (ref 0.7–4.0)
LYMPHS PCT: 6 %
MCH: 32.1 pg (ref 26.0–34.0)
MCHC: 34.3 g/dL (ref 30.0–36.0)
MCV: 93.4 fL (ref 78.0–100.0)
MONOS PCT: 6 %
Monocytes Absolute: 0.9 10*3/uL (ref 0.1–1.0)
NEUTROS PCT: 88 %
Neutro Abs: 12.1 10*3/uL — ABNORMAL HIGH (ref 1.7–7.7)
Platelets: 218 10*3/uL (ref 150–400)
RBC: 5.02 MIL/uL (ref 4.22–5.81)
RDW: 14.5 % (ref 11.5–15.5)
WBC: 13.8 10*3/uL — AB (ref 4.0–10.5)

## 2016-04-28 LAB — GLUCOSE, CAPILLARY
GLUCOSE-CAPILLARY: 172 mg/dL — AB (ref 65–99)
Glucose-Capillary: 116 mg/dL — ABNORMAL HIGH (ref 65–99)
Glucose-Capillary: 120 mg/dL — ABNORMAL HIGH (ref 65–99)
Glucose-Capillary: 130 mg/dL — ABNORMAL HIGH (ref 65–99)
Glucose-Capillary: 161 mg/dL — ABNORMAL HIGH (ref 65–99)

## 2016-04-28 LAB — COMPREHENSIVE METABOLIC PANEL
ALK PHOS: 83 U/L (ref 38–126)
ALT: 32 U/L (ref 17–63)
ANION GAP: 10 (ref 5–15)
AST: 43 U/L — ABNORMAL HIGH (ref 15–41)
Albumin: 5 g/dL (ref 3.5–5.0)
BILIRUBIN TOTAL: 0.5 mg/dL (ref 0.3–1.2)
BUN: 22 mg/dL — ABNORMAL HIGH (ref 6–20)
CALCIUM: 8.8 mg/dL — AB (ref 8.9–10.3)
CO2: 26 mmol/L (ref 22–32)
CREATININE: 1.43 mg/dL — AB (ref 0.61–1.24)
Chloride: 99 mmol/L — ABNORMAL LOW (ref 101–111)
Glucose, Bld: 154 mg/dL — ABNORMAL HIGH (ref 65–99)
Potassium: 7.2 mmol/L (ref 3.5–5.1)
SODIUM: 135 mmol/L (ref 135–145)
TOTAL PROTEIN: 8.9 g/dL — AB (ref 6.5–8.1)

## 2016-04-28 LAB — RAPID URINE DRUG SCREEN, HOSP PERFORMED
Amphetamines: NOT DETECTED
Barbiturates: NOT DETECTED
Benzodiazepines: NOT DETECTED
Cocaine: NOT DETECTED
OPIATES: POSITIVE — AB
TETRAHYDROCANNABINOL: NOT DETECTED

## 2016-04-28 LAB — I-STAT TROPONIN, ED: TROPONIN I, POC: 0.05 ng/mL (ref 0.00–0.08)

## 2016-04-28 LAB — ETHANOL: Alcohol, Ethyl (B): <5 mg/dL (ref <5)

## 2016-04-28 LAB — URINE MICROSCOPIC-ADD ON

## 2016-04-28 LAB — SALICYLATE LEVEL

## 2016-04-28 LAB — POTASSIUM: POTASSIUM: 4.4 mmol/L (ref 3.5–5.1)

## 2016-04-28 LAB — ACETAMINOPHEN LEVEL: Acetaminophen (Tylenol), Serum: <10 ug/mL — ABNORMAL LOW (ref 10–30)

## 2016-04-28 LAB — MRSA PCR SCREENING: MRSA by PCR: NEGATIVE

## 2016-04-28 LAB — I-STAT CG4 LACTIC ACID, ED: Lactic Acid, Venous: 3.69 mmol/L (ref 0.5–1.9)

## 2016-04-28 LAB — STREP PNEUMONIAE URINARY ANTIGEN: STREP PNEUMO URINARY ANTIGEN: NEGATIVE

## 2016-04-28 LAB — LACTIC ACID, PLASMA: LACTIC ACID, VENOUS: 1.6 mmol/L (ref 0.5–1.9)

## 2016-04-28 MED ORDER — SODIUM CHLORIDE 0.9 % IV BOLUS (SEPSIS)
2500.0000 mL | Freq: Once | INTRAVENOUS | Status: AC
  Administered 2016-04-28: 2500 mL via INTRAVENOUS

## 2016-04-28 MED ORDER — IPRATROPIUM-ALBUTEROL 0.5-2.5 (3) MG/3ML IN SOLN
3.0000 mL | RESPIRATORY_TRACT | Status: DC | PRN
  Administered 2016-04-28: 3 mL via RESPIRATORY_TRACT
  Filled 2016-04-28: qty 3

## 2016-04-28 MED ORDER — NALOXONE HCL 0.4 MG/ML IJ SOLN
0.4000 mg | Freq: Once | INTRAMUSCULAR | Status: AC
  Administered 2016-04-28: 0.4 mg via INTRAVENOUS
  Filled 2016-04-28: qty 1

## 2016-04-28 MED ORDER — CLINDAMYCIN PHOSPHATE 300 MG/50ML IV SOLN: 300.0000 mg | Freq: Once | INTRAVENOUS | Status: DC

## 2016-04-28 MED ORDER — SODIUM CHLORIDE 0.9 % IV BOLUS (SEPSIS)
1000.0000 mL | Freq: Once | INTRAVENOUS | Status: AC
  Administered 2016-04-28: 1000 mL via INTRAVENOUS

## 2016-04-28 MED ORDER — SODIUM CHLORIDE 0.9 % IV SOLN
3.0000 g | Freq: Four times a day (QID) | INTRAVENOUS | Status: DC
  Filled 2016-04-28: qty 3

## 2016-04-28 MED ORDER — HEPARIN SODIUM (PORCINE) 5000 UNIT/ML IJ SOLN
5000.0000 [IU] | Freq: Three times a day (TID) | INTRAMUSCULAR | Status: DC
  Administered 2016-04-28: 5000 [IU] via SUBCUTANEOUS
  Filled 2016-04-28 (×2): qty 1

## 2016-04-28 MED ORDER — SODIUM CHLORIDE 0.9 % IV SOLN
3.0000 g | Freq: Four times a day (QID) | INTRAVENOUS | Status: DC
  Administered 2016-04-28 – 2016-04-29 (×4): 3 g via INTRAVENOUS
  Filled 2016-04-28 (×10): qty 3

## 2016-04-28 MED ORDER — SODIUM CHLORIDE 0.9 % IV SOLN
3.0000 g | Freq: Once | INTRAVENOUS | Status: AC
  Administered 2016-04-28: 3 g via INTRAVENOUS
  Filled 2016-04-28: qty 3

## 2016-04-28 MED ORDER — SODIUM CHLORIDE 0.9 % IV BOLUS (SEPSIS): 2000.0000 mL | Freq: Once | INTRAVENOUS | Status: DC

## 2016-04-28 MED ORDER — THIAMINE HCL 100 MG/ML IJ SOLN
100.0000 mg | Freq: Every day | INTRAMUSCULAR | Status: DC
  Administered 2016-04-28 – 2016-04-29 (×2): 100 mg via INTRAVENOUS
  Filled 2016-04-28 (×3): qty 2

## 2016-04-28 MED ORDER — INSULIN ASPART 100 UNIT/ML ~~LOC~~ SOLN
0.0000 [IU] | SUBCUTANEOUS | Status: DC
  Administered 2016-04-29: 2 [IU] via SUBCUTANEOUS

## 2016-04-28 MED ORDER — SODIUM CHLORIDE 0.9 % IV SOLN
1.0000 g | Freq: Once | INTRAVENOUS | Status: AC
  Administered 2016-04-28: 1 g via INTRAVENOUS
  Filled 2016-04-28: qty 10

## 2016-04-28 MED ORDER — NALOXONE HCL 0.4 MG/ML IJ SOLN: 0.4000 mg | Freq: Once | INTRAMUSCULAR | Status: DC

## 2016-04-28 MED ORDER — NALOXONE HCL 2 MG/2ML IJ SOSY
1.0000 mg/h | PREFILLED_SYRINGE | INTRAVENOUS | Status: DC
  Administered 2016-04-28 – 2016-04-29 (×6): 1 mg/h via INTRAVENOUS
  Filled 2016-04-28 (×7): qty 4

## 2016-04-28 MED ORDER — SODIUM CHLORIDE 0.9 % IV SOLN: 250.0000 mL | INTRAVENOUS | Status: DC | PRN

## 2016-04-28 MED ORDER — INSULIN ASPART 100 UNIT/ML IV SOLN
10.0000 [IU] | Freq: Once | INTRAVENOUS | Status: AC
  Administered 2016-04-28: 10 [IU] via INTRAVENOUS
  Filled 2016-04-28: qty 0.1

## 2016-04-28 MED ORDER — SODIUM CHLORIDE 0.9 % IV SOLN
INTRAVENOUS | Status: DC
  Administered 2016-04-28 – 2016-04-29 (×3): via INTRAVENOUS

## 2016-04-28 MED ORDER — DEXTROSE 50 % IV SOLN: 1.0000 | Freq: Once | INTRAVENOUS | Status: DC

## 2016-04-28 MED ORDER — DEXTROSE 10 % IV SOLN: Freq: Once | INTRAVENOUS | Status: DC

## 2016-04-28 MED ORDER — ALBUTEROL SULFATE (2.5 MG/3ML) 0.083% IN NEBU
5.0000 mg | INHALATION_SOLUTION | Freq: Once | RESPIRATORY_TRACT | Status: AC
  Administered 2016-04-28: 5 mg via RESPIRATORY_TRACT
  Filled 2016-04-28: qty 6

## 2016-04-28 NOTE — ED Notes (Signed)
First set of blood cultures sent. Unable to get second set at this time.

## 2016-04-28 NOTE — Care Management Note (Signed)
Case Management Note  Patient Details  Name: Fred MossesJohn Sabino Jr. MRN: [id number] Date of Birth: Mar 14, 1986  Subjective/Objective:    Heroine overdose, tbd for outcome s/p psych assessment.                Action/Plan:Date:  April 28, 2016 Chart reviewed for concurrent status and case management needs. Will continue to follow patient progress. Discharge Planning: following for needs Expected discharge date: [id number] Marcelle SmilingRhonda Treydon Henricks, BSN, [place]3, [place]   [phone]   Expected Discharge Date:   (UNKNOWN)               Expected Discharge Plan:  Home/Self Care  In-House Referral:  Clinical Social Work  Discharge planning Services     Post Acute Care Choice:    Choice offered to:     DME Arranged:    DME Agency:     HH Arranged:    HH Agency:     Status of Service:  In process, will continue to follow  If discussed at Long Length of Stay Meetings, dates discussed:    Additional Comments:  Golda AcreDavis, Marrie Chandra Lynn, RN 04/28/2016, 11:25 AM
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
## 2016-04-28 NOTE — Procedures (Signed)
Central Venous Catheter Insertion Procedure Note Fred MossesJohn Oman Jr. [id number] [birth year]  Procedure: Insertion of Central Venous Catheter Indications: Drug and/or fluid administration  Procedure Details Consent: PT encephalopathic and unable to provide consent. Emergently required in order to continue care  Time Out: Verified patient identification, verified procedure, site/side was marked, verified correct patient position, special equipment/implants available, medications/allergies/relevent history reviewed, required imaging and test results available.  Performed Real time US was used to ID and cannulate vessel  Maximum sterile technique was used including antiseptics, cap, gloves, gown, hand hygiene, mask and sheet. Skin prep: Chlorhexidine; local anesthetic administered A antimicrobial bonded/coated triple lumen catheter was placed in the right internal jugular vein using the Seldinger technique.  Evaluation Blood flow good Complications: No apparent complications Patient did tolerate procedure well. Chest X-ray ordered to verify placement.  CXR: pending.  Fred Reynolds 04/28/2016, 2:12 PM  Fred Reynolds ACNP-BC [facility]/Critical Care Pager # [phone] OR # [phone] if no answer

## 2016-04-28 NOTE — ED Notes (Signed)
Bed: RESB Expected date:  Expected time:  Means of arrival:  Comments: Heroin overdose
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
## 2016-04-28 NOTE — ED Notes (Signed)
Attempted to get the second set of blood cultures. Was not successful

## 2016-04-28 NOTE — Progress Notes (Signed)
Afternoon rounds  Events Received complete crystalloid bolus w/ excellent response as well as Lactic acid clearance.  BP has maintained.  ABG stable   Sepsis - Repeat Assessment  Performed at: 1200 but lactic acid was delayed as pt refused vena puncture   Vitals     Blood pressure (!) 116/55, pulse (!) 103, temperature 99.2 F (37.3 C), temperature source Axillary, resp. rate (!) 23, height 5\' 2"  (1.575 m), weight 274 lb 7.6 oz (124.5 kg), SpO2 100 %.  Heart:     Regular rate and rhythm  Lungs:    CTA and Rhonchi  Capillary Refill:   <2 sec  Peripheral Pulse:   Radial pulse palpable  Skin:     Normal Color  ABG    Component Value Date/Time   PHART 7.379 04/28/2016 1453   PCO2ART 37.6 04/28/2016 1453   PO2ART 109 (H) 04/28/2016 1453   HCO3 21.7 04/28/2016 1453   TCO2 24 12/16/2013 1931   ACIDBASEDEF 2.5 (H) 04/28/2016 1453   O2SAT 97.6 04/28/2016 1453    Recent Labs Lab 04/28/16 0702 04/28/16 1400  NA 135 137  K 7.2* 5.3*  CL 99* 107  CO2 26 22  BUN 22* 22*  CREATININE 1.43* 1.27*  GLUCOSE 154* 150*    Recent Labs Lab 04/28/16 0702  HGB 16.1  HCT 46.9  WBC 13.8*  PLT 218    Impression/plan  Acute encephalopathy-->improved; holding sedating meds Overdose-->cont narcan gtt for now Aspiration PNA-->cont unasyn  Acute hypoxic respiratory failure-->continue oxygen  Sepsis/shock resolved->cont IVFs and tele AKI-->improving; cont IVFs Hyperkalemia w/ nml renal fxn-->improved. Cont IVF and f/u am chemistry   35 minutes ccm time   Simonne MartinetPeter E Margel Joens ACNP-BC [facility] Pulmonary/Critical Care Pager # [phone] OR # [phone] if no answer

## 2016-04-28 NOTE — Progress Notes (Addendum)
Phlebotomy unsuccessfull in getting labs that were due late morning.  Pt refusing further sticks.  Informed Anders SimmondsPete Babcock NP.  Erick Blinksuchman, Allisha Harter D, RN

## 2016-04-28 NOTE — ED Triage Notes (Signed)
Fred Reynolds was found by mother apneic and unresponsive and thick oral secretions mixed with  Blood BVM for 10 min and given 2 mg nasal narcan awake at 0610 and admitted to using herion CPAP 7.5 and 5 mg albuteral of wheezing and possible aspiration

## 2016-04-29 ENCOUNTER — Inpatient Hospital Stay (HOSPITAL_COMMUNITY): Payer: Self-pay

## 2016-04-29 DIAGNOSIS — T50902A Poisoning by unspecified drugs, medicaments and biological substances, intentional self-harm, initial encounter: Secondary | ICD-10-CM

## 2016-04-29 DIAGNOSIS — T1491XA Suicide attempt, initial encounter: Secondary | ICD-10-CM

## 2016-04-29 DIAGNOSIS — J690 Pneumonitis due to inhalation of food and vomit: Secondary | ICD-10-CM

## 2016-04-29 LAB — RENAL FUNCTION PANEL
Albumin: 3.5 g/dL (ref 3.5–5.0)
Anion gap: 6 (ref 5–15)
BUN: 10 mg/dL (ref 6–20)
CHLORIDE: 107 mmol/L (ref 101–111)
CO2: 25 mmol/L (ref 22–32)
CREATININE: 0.82 mg/dL (ref 0.61–1.24)
Calcium: 8 mg/dL — ABNORMAL LOW (ref 8.9–10.3)
GFR calc Af Amer: >60 mL/min (ref >60)
Glucose, Bld: 101 mg/dL — ABNORMAL HIGH (ref 65–99)
Phosphorus: 1.6 mg/dL — ABNORMAL LOW (ref 2.5–4.6)
Potassium: 3.9 mmol/L (ref 3.5–5.1)
Sodium: 138 mmol/L (ref 135–145)

## 2016-04-29 LAB — CBC WITH DIFFERENTIAL/PLATELET
BASOS ABS: 0 10*3/uL (ref 0.0–0.1)
BASOS PCT: 0 %
EOS ABS: 0.1 10*3/uL (ref 0.0–0.7)
EOS PCT: 1 %
HCT: 37.7 % — ABNORMAL LOW (ref 39.0–52.0)
HEMOGLOBIN: 12.9 g/dL — AB (ref 13.0–17.0)
Lymphocytes Relative: 13 %
Lymphs Abs: 1.3 10*3/uL (ref 0.7–4.0)
MCH: 31 pg (ref 26.0–34.0)
MCHC: 34.2 g/dL (ref 30.0–36.0)
MCV: 90.6 fL (ref 78.0–100.0)
Monocytes Absolute: 1 10*3/uL (ref 0.1–1.0)
Monocytes Relative: 9 %
NEUTROS PCT: 77 %
Neutro Abs: 7.9 10*3/uL — ABNORMAL HIGH (ref 1.7–7.7)
PLATELETS: 173 10*3/uL (ref 150–400)
RBC: 4.16 MIL/uL — AB (ref 4.22–5.81)
RDW: 14.4 % (ref 11.5–15.5)
WBC: 10.2 10*3/uL (ref 4.0–10.5)

## 2016-04-29 LAB — GLUCOSE, CAPILLARY
GLUCOSE-CAPILLARY: 107 mg/dL — AB (ref 65–99)
GLUCOSE-CAPILLARY: 99 mg/dL (ref 65–99)
Glucose-Capillary: 108 mg/dL — ABNORMAL HIGH (ref 65–99)
Glucose-Capillary: 96 mg/dL (ref 65–99)

## 2016-04-29 LAB — PROCALCITONIN: Procalcitonin: 2.14 ng/mL

## 2016-04-29 LAB — MAGNESIUM: MAGNESIUM: 2.1 mg/dL (ref 1.7–2.4)

## 2016-04-29 MED ORDER — HALOPERIDOL LACTATE 5 MG/ML IJ SOLN
5.0000 mg | Freq: Four times a day (QID) | INTRAMUSCULAR | Status: DC | PRN
  Administered 2016-04-29: 5 mg via INTRAVENOUS
  Filled 2016-04-29: qty 1

## 2016-04-29 MED ORDER — ONDANSETRON HCL 4 MG/2ML IJ SOLN: 4.0000 mg | Freq: Four times a day (QID) | INTRAMUSCULAR | Status: DC | PRN

## 2016-04-29 NOTE — Progress Notes (Signed)
PHARMACY NOTE -  Unasyn  Pharmacy has been assisting with dosing of Unasyn for aspiration PNA. Dosage remains stable at 3gm IV q6h and need for further dosage adjustment appears unlikely at present.    Will sign off at this time (follow peripherally while in ICU).  Please reconsult if a change in clinical status warrants re-evaluation of dosage.  Junita PushMichelle Areana Kosanke, PharmD, BCPS Pager: [phone] 04/29/2016@12 :03 PM

## 2016-04-29 NOTE — Progress Notes (Signed)
Pt. Refusing to have labs drawn for repeat K+ and Bld. Culture. Provider notified.

## 2016-04-29 NOTE — Progress Notes (Signed)
Patient transferred to [facility] room 1507.  Oriented to unit, room, and staff.  Sitter at bedside.  Telemetry placed on patient and called CCMD with two verifiers.  Patient is alert and oriented, no acute distress.  Vitals obtained and patient assessed, will continue to monitor.

## 2016-04-29 NOTE — Progress Notes (Signed)
PULMONARY / CRITICAL CARE MEDICINE   Name: Fred Reynolds. MRN: [id number] DOB: [birth year]    ADMISSION DATE:  04/28/2016 CONSULTATION DATE:  11/21  REFERRING MD:  Lawrence Marseilles   CHIEF COMPLAINT:  Acute resp failure and overdose   HISTORY OF PRESENT ILLNESS:   30 year old male w/ prior h/o known polysubstance abuse. Found on floor by mother unresponsive w/ agonal resp pattern on 11/21. EMS called. Received narcan and manual bag mask ventilation. Became more awake and responsive. Was transported to ER on CPAP. On arrival admitted to heroin use that am. Further eval in ER demonstrated: AKI, hyperkalemia w/ K >7, mild lactic acidosis and RLL airspace disease as well as RUL consolidation. He was placed on Narcan gtt, IVF bolus given and abx initiated in the ER per sepsis protocol, and PCCM asked to admit.   SUBJECTIVE: No acute events overnight. Patient refusing to have labs drawn from his central line. Mother contacted nursing concerned that he may be a flight risk and believes he did attempt to commit suicide with an overdose. Patient denies any chest pain, chest pressure, or chest tightness. He denies any nausea or vomiting. Denies any abdominal pain. Reports he is not hungry. Does recall taking heroin yesterday. Does not recall the events preceding his transport to the emergency department.  REVIEW OF SYSTEMS:  No dyspnea or cough. No subjective fever or chills. No headache. Denies any suicidal ideation.  VITAL SIGNS: BP (!) 100/59   Pulse 83   Temp 98.7 F (37.1 C) (Oral)   Resp 19   Ht 5\' 2"  (1.575 m)   Wt 276 lb 7.3 oz (125.4 kg)   SpO2 96%   BMI 50.56 kg/m   HEMODYNAMICS:    VENTILATOR SETTINGS: FiO2 (%):  [55 %-100 %] 55 %  INTAKE / OUTPUT: I/O last 3 completed shifts: In: 3514 [P.O.:240; I.V.:3074; IV Piggyback:200] Out: 3325 [Urine:3325]  PHYSICAL EXAMINATION: General:  Obese male. Laying in bed. Eyes closed and lights off. Sitter at bedside. Neuro:  Oriented 3.  Grossly nonfocal. No meningismus. HEENT:  Dry mucous membranes. No scleral icterus. Nasal cannula oxygen in place. Cardiovascular:  Regular rate and rhythm. No JVD or edema. Pulmonary: Normal work of breathing on nasal cannula oxygen. Slightly diminished breath sounds in the bases bilaterally. Abdomen:  Soft. Protuberant. Nontender. Normal bowel sounds. Integument: Warm and dry. No rash on exposed skin. Psychiatric: Patient denies any suicidal ideation.  LABS:  BMET  Recent Labs Lab 04/28/16 0702 04/28/16 1400 04/28/16 1845  NA 135 137  --   K 7.2* 5.3* 4.4  CL 99* 107  --   CO2 26 22  --   BUN 22* 22*  --   CREATININE 1.43* 1.27*  --   GLUCOSE 154* 150*  --     Electrolytes  Recent Labs Lab 04/28/16 0702 04/28/16 1400  CALCIUM 8.8* 7.9*    CBC  Recent Labs Lab 04/28/16 0702  WBC 13.8*  HGB 16.1  HCT 46.9  PLT 218    Coag's No results for input(s): APTT, INR in the last 168 hours.  Sepsis Markers  Recent Labs Lab 04/28/16 0800 04/28/16 1400  LATICACIDVEN 3.69* 1.6    ABG  Recent Labs Lab 04/28/16 0716 04/28/16 1453  PHART 7.318* 7.379  PCO2ART 46.2 37.6  PO2ART 122* 109*    Liver Enzymes  Recent Labs Lab 04/28/16 0702  AST 43*  ALT 32  ALKPHOS 83  BILITOT 0.5  ALBUMIN 5.0    Cardiac Enzymes  No results for input(s): TROPONINI, PROBNP in the last 168 hours.  Glucose  Recent Labs Lab 04/28/16 1052 04/28/16 1159 04/28/16 1651 04/28/16 2003 04/28/16 2341 04/29/16 0402  GLUCAP 120* 130* 172* 116* 161* 107*    Imaging Dg Chest Port 1 View  Result Date: 04/28/2016 CLINICAL DATA:  Shortness of breath.  Central line placement. EXAM: PORTABLE CHEST 1 VIEW COMPARISON:  Radiograph of same day. FINDINGS: Stable cardiomediastinal silhouette. Left lung is clear. No pneumothorax is noted. Increased right upper lobe airspace opacity is noted concerning for pneumonia. Interval placement of right internal jugular catheter with distal  tip in expected position of the SVC. Bony thorax is unremarkable. IMPRESSION: Worsening right upper lobe pneumonia. Interval placement of right internal jugular catheter with distal tip in expected position of SVC. No pneumothorax is noted. These results were called by telephone at the time of interpretation on 04/28/2016 at 2:55 pm to [facility], RN, who verbally acknowledged these results. Electronically Signed   By: Lupita RaiderJames  Green Jr, M.D.   On: 04/28/2016 14:56     STUDIES:  UDS 11/21:  Positive Opiates  MICROBIOLOGY: MRSA PCR 11/21:  Negative Blood Ctx x1 11/21 >> Urine Ctx 11/21 >> Urine Strep Ag 11/21:  Negative Urine Legionella Ag 11/21 >>  ANTIBIOTICS: Unasyn 11/21>>>  SIGNIFICANT EVENTS: 11/21 - Admit with intentional overdose  LINES/TUBES: R IJ CVL 11/21 >> PIV  ASSESSMENT / PLAN:  NEUROLOGIC A:   Acute Encephalopathy - Improving. Secondary to intentional overdose. Intentional Overdose/Suicide Attempt H/O Polysubstance Abuse/Use H/O Bipolar Disorder  P:   RASS goal: 0  Holding Narcan drip Hold all sedating medications Neuro Checks q4hr Suicide Precautions Sitter at bedside ordered Psychiatry Consulted  PULMONARY A: Acute Hypoxic Respiratory Failure - Likely due to aspiration. Aspiration Pneumonia vs Pneumonitis H/O Asthma H/O OSA  P:   Weaning FiO2 for Sat >88% Continuous Pulse Oximetry No NIPPV given aspiration risk  CARDIOVASCULAR A:  Hypotension - Resolved w/ IVF.  P:  Repeat LA after crystalloid challenge here in ER Cont NS If LA doesn't clear will need pressors and central access   RENAL A:   Acute Renal Failure - Resolving. Lactic Acidosis - Resolved. Hyperkalemia - Resolved.   P:   Monitoring renal function & electrolytes daily Replacing electrolytes as indicated  GASTROINTESTINAL A:   Nausea w/ Emesis - Resolved.   P:   Advancing diet as tolerated Zofran IV prn   HEMATOLOGIC A:   Leukocytosis - Likely secondary to  demargination & possible aspiration pneumonia.   P:  Trending cell counts daily w/ CBC SCDs Heparin Gem q8hr  INFECTIOUS A:   Sepsis Aspiration Pneumonia vs Pneumonitis  P:   Unasyn Day #2/7 Awaiting Culture & Urine antigen results Trending Procalcitonin per algorithm   ENDOCRINE A:   Hyperglycemia - Resolved.  P:   Continuing Accu-Checks q4hr SSI per Sensitive Algorithm    FAMILY  - Updates: Mother Garrel RidgelDebra Farina called by Dr. Jamison NeighborNestor 11/22 but no answer.   - Inter-disciplinary family meet or Palliative Care meeting due by:  11/28    TODAY'S SUMMARY:  30 y.o. male admitted with acute encephalopathy secondary to hair when use/overdose. History of suicide attempt and family are concerned with the same. Transitioning off Narcan infusion while closely monitoring for signs of worsening mental status. Continuing Unasyn for aspiration pneumonia with improving oxygen requirement from acute hypoxic respiratory failure. Patient if patient is able to remain off Narcan infusion plan for transition to a stepdown/telemetry floor bed for  further treatment and monitoring.  I have spent a total of 33 minutes of critical care time today caring for the patient in reviewing the patient's electronic medical record.  Donna ChristenJennings E. Jamison NeighborNestor, M.D. [facility] Pager:  [phone] After 3pm or if no response, call [phone] 04/29/2016, 7:50 AM

## 2016-04-29 NOTE — Progress Notes (Addendum)
PCCM Attending Re-Rounding Note: Patient seen again this afternoon on rounds. He reports he wants to go home today and that he is not suicidal. He wants to go home for the holiday. The patient's mother contacted his bedside nurse reporting she is worried he may try to leave and hurt himself or attempt suicide again. She believes he attempted suicide this time as well. His mother reports he has overdosed twice in the last couple of weeks. I explained to the patient that he is sick with pneumonia and medical problems he would be at risk for clinical worsening and a risk to his life. His mother reports she has found drugs in her home. I advised her to contact the police and notify them of the situation so they can appropriately and legally dispose of the drugs. We will be placing him under involuntary commitment. I have personally called the psychiatry consultation. We will transfer him to a medical-surgical floor bed since he has not required the Narcan drip further. Transferring care to hospitalist service & PCCM will sign off as of 11/23.  Donna ChristenJennings E. Jamison NeighborNestor, M.D. [facility] Pager:  [phone] After 3pm or if no response, call [phone] 2:35 PM 04/29/16

## 2016-04-29 NOTE — Progress Notes (Signed)
IVC papers [facility] by [employer]. Papers on Chart. Became agitated; pulled out one PIV. 1730 Reassured; given 5mg  IV Haldol slow push; Qtc.44 !745 Asleep. Prior to this is refusing IVF,ATX, pulse ox. Suicide sitter at bedside.

## 2016-04-29 NOTE — Progress Notes (Signed)
   04/29/16 1200  Clinical Encounter Type  Visited With Patient  Visit Type Initial;Psychological support;Spiritual support;Critical Care  Referral From Nurse  Consult/Referral To Chaplain  Spiritual Encounters  Spiritual Needs Emotional;Other (Comment) (Pastoral Conversation/Support)  Stress Factors  Patient Stress Factors Other (Comment) (Wants to be discharged )   Visited with the patient per Spiritual Care consult. The patient was asleep upon my arrival, but woke up to state that he wanted to go home. He refused Spiritual Care and stated that he did not want a follow-up.  Please, contact Spiritual Care for further assistance.   Chaplain Clint BolderBrittany Bright Spielmann M.Div.

## 2016-04-29 NOTE — Progress Notes (Signed)
Dr Jamison NeighborNestor notifed patient refused unasyn and heparin at 1400 .

## 2016-04-29 NOTE — Progress Notes (Signed)
Patient states he does not want information given to his mother about his condition.

## 2016-04-29 NOTE — Progress Notes (Signed)
Dr. Dalbert Mayotteamaswammy, CCM, informed of transfer to Rm 1507. Clarified that patient will be CCM until 0700 in am.  Informed him that Psychiatry has not seen him, prior psychiatric  medications have not been restarted, and he is refusing IVF and antibiotics for RLL PNA.

## 2016-04-29 NOTE — Progress Notes (Signed)
IVC paperwork completed with Dr. Jamison NeighborNestor, [facility] Paperwork Received, pt. To be served by [employer].   Vivi BarrackNicole Shanika Levings, Theresia MajorsLCSWA, MSW Clinical Social Worker 5E and Psychiatric Service Line [phone]

## 2016-04-30 ENCOUNTER — Encounter: Payer: Self-pay | Admitting: Emergency Medicine

## 2016-04-30 ENCOUNTER — Emergency Department: Payer: Self-pay

## 2016-04-30 ENCOUNTER — Inpatient Hospital Stay
Admission: EM | Admit: 2016-04-30 | Discharge: 2016-05-03 | DRG: 194 | Disposition: A | Discharge status: Other Acute Inpatient Hospital | Payer: Self-pay | Attending: Internal Medicine | Admitting: Internal Medicine

## 2016-04-30 DIAGNOSIS — F1320 Sedative, hypnotic or anxiolytic dependence, uncomplicated: Secondary | ICD-10-CM | POA: Diagnosis present

## 2016-04-30 DIAGNOSIS — F1123 Opioid dependence with withdrawal: Secondary | ICD-10-CM | POA: Diagnosis not present

## 2016-04-30 DIAGNOSIS — T50901A Poisoning by unspecified drugs, medicaments and biological substances, accidental (unintentional), initial encounter: Secondary | ICD-10-CM

## 2016-04-30 DIAGNOSIS — I248 Other forms of acute ischemic heart disease: Secondary | ICD-10-CM | POA: Diagnosis present

## 2016-04-30 DIAGNOSIS — Z9889 Other specified postprocedural states: Secondary | ICD-10-CM

## 2016-04-30 DIAGNOSIS — Z590 Homelessness: Secondary | ICD-10-CM

## 2016-04-30 DIAGNOSIS — R45851 Suicidal ideations: Secondary | ICD-10-CM | POA: Diagnosis present

## 2016-04-30 DIAGNOSIS — Z91030 Bee allergy status: Secondary | ICD-10-CM

## 2016-04-30 DIAGNOSIS — Z79899 Other long term (current) drug therapy: Secondary | ICD-10-CM

## 2016-04-30 DIAGNOSIS — F11229 Opioid dependence with intoxication, unspecified: Secondary | ICD-10-CM | POA: Diagnosis not present

## 2016-04-30 DIAGNOSIS — Z6841 Body Mass Index (BMI) 40.0 and over, adult: Secondary | ICD-10-CM

## 2016-04-30 DIAGNOSIS — I214 Non-ST elevation (NSTEMI) myocardial infarction: Secondary | ICD-10-CM

## 2016-04-30 DIAGNOSIS — F319 Bipolar disorder, unspecified: Secondary | ICD-10-CM | POA: Diagnosis present

## 2016-04-30 DIAGNOSIS — R042 Hemoptysis: Secondary | ICD-10-CM | POA: Diagnosis present

## 2016-04-30 DIAGNOSIS — F17210 Nicotine dependence, cigarettes, uncomplicated: Secondary | ICD-10-CM | POA: Diagnosis present

## 2016-04-30 DIAGNOSIS — J189 Pneumonia, unspecified organism: Principal | ICD-10-CM

## 2016-04-30 DIAGNOSIS — Z794 Long term (current) use of insulin: Secondary | ICD-10-CM

## 2016-04-30 DIAGNOSIS — Z8614 Personal history of Methicillin resistant Staphylococcus aureus infection: Secondary | ICD-10-CM

## 2016-04-30 DIAGNOSIS — T401X1A Poisoning by heroin, accidental (unintentional), initial encounter: Secondary | ICD-10-CM

## 2016-04-30 DIAGNOSIS — R0600 Dyspnea, unspecified: Secondary | ICD-10-CM

## 2016-04-30 DIAGNOSIS — E669 Obesity, unspecified: Secondary | ICD-10-CM | POA: Diagnosis present

## 2016-04-30 DIAGNOSIS — F419 Anxiety disorder, unspecified: Secondary | ICD-10-CM | POA: Diagnosis present

## 2016-04-30 DIAGNOSIS — Z818 Family history of other mental and behavioral disorders: Secondary | ICD-10-CM

## 2016-04-30 HISTORY — DX: Opioid abuse, uncomplicated: F11.10

## 2016-04-30 HISTORY — DX: Other psychoactive substance abuse, uncomplicated: F19.10

## 2016-04-30 HISTORY — DX: Depression, unspecified: F32.A

## 2016-04-30 HISTORY — DX: Major depressive disorder, single episode, unspecified: F32.9

## 2016-04-30 HISTORY — DX: Suicide attempt, initial encounter: T14.91XA

## 2016-04-30 LAB — URINE DRUG SCREEN, QUALITATIVE (ARMC ONLY)
AMPHETAMINES, UR SCREEN: NOT DETECTED
BENZODIAZEPINE, UR SCRN: NOT DETECTED
Barbiturates, Ur Screen: NOT DETECTED
Cannabinoid 50 Ng, Ur ~~LOC~~: NOT DETECTED
Cocaine Metabolite,Ur ~~LOC~~: NOT DETECTED
MDMA (ECSTASY) UR SCREEN: NOT DETECTED
METHADONE SCREEN, URINE: NOT DETECTED
OPIATE, UR SCREEN: POSITIVE — AB
Phencyclidine (PCP) Ur S: NOT DETECTED
Tricyclic, Ur Screen: NOT DETECTED

## 2016-04-30 LAB — COMPREHENSIVE METABOLIC PANEL
ALBUMIN: 3.8 g/dL (ref 3.5–5.0)
ALT: 55 U/L (ref 17–63)
ANION GAP: 8 (ref 5–15)
AST: 73 U/L — AB (ref 15–41)
Alkaline Phosphatase: 65 U/L (ref 38–126)
BILIRUBIN TOTAL: 0.6 mg/dL (ref 0.3–1.2)
BUN: 9 mg/dL (ref 6–20)
CHLORIDE: 109 mmol/L (ref 101–111)
CO2: 24 mmol/L (ref 22–32)
Calcium: 9.3 mg/dL (ref 8.9–10.3)
Creatinine, Ser: 0.87 mg/dL (ref 0.61–1.24)
GFR calc Af Amer: >60 mL/min (ref >60)
GFR calc non Af Amer: >60 mL/min (ref >60)
GLUCOSE: 107 mg/dL — AB (ref 65–99)
POTASSIUM: 3.6 mmol/L (ref 3.5–5.1)
SODIUM: 141 mmol/L (ref 135–145)
TOTAL PROTEIN: 7.5 g/dL (ref 6.5–8.1)

## 2016-04-30 LAB — CBC WITH DIFFERENTIAL/PLATELET
BASOS ABS: 0 10*3/uL (ref 0.0–0.1)
Basophils Relative: 0 %
EOS ABS: 0.2 10*3/uL (ref 0.0–0.7)
EOS PCT: 2 %
HCT: 38 % — ABNORMAL LOW (ref 39.0–52.0)
Hemoglobin: 12.9 g/dL — ABNORMAL LOW (ref 13.0–17.0)
LYMPHS PCT: 22 %
Lymphs Abs: 1.9 10*3/uL (ref 0.7–4.0)
MCH: 31.3 pg (ref 26.0–34.0)
MCHC: 33.9 g/dL (ref 30.0–36.0)
MCV: 92.2 fL (ref 78.0–100.0)
MONO ABS: 0.8 10*3/uL (ref 0.1–1.0)
Monocytes Relative: 10 %
Neutro Abs: 5.6 10*3/uL (ref 1.7–7.7)
Neutrophils Relative %: 66 %
PLATELETS: 179 10*3/uL (ref 150–400)
RBC: 4.12 MIL/uL — ABNORMAL LOW (ref 4.22–5.81)
RDW: 14.1 % (ref 11.5–15.5)
WBC: 8.5 10*3/uL (ref 4.0–10.5)

## 2016-04-30 LAB — CBC
HEMATOCRIT: 42.3 % (ref 40.0–52.0)
Hemoglobin: 14.7 g/dL (ref 13.0–18.0)
MCH: 31.9 pg (ref 26.0–34.0)
MCHC: 34.6 g/dL (ref 32.0–36.0)
MCV: 92 fL (ref 80.0–100.0)
Platelets: 195 10*3/uL (ref 150–440)
RBC: 4.6 MIL/uL (ref 4.40–5.90)
RDW: 14.2 % (ref 11.5–14.5)
WBC: 13.9 10*3/uL — AB (ref 3.8–10.6)

## 2016-04-30 LAB — LEGIONELLA PNEUMOPHILA SEROGP 1 UR AG: L. PNEUMOPHILA SEROGP 1 UR AG: NEGATIVE

## 2016-04-30 LAB — ACETAMINOPHEN LEVEL

## 2016-04-30 LAB — GLUCOSE, CAPILLARY
GLUCOSE-CAPILLARY: 110 mg/dL — AB (ref 65–99)
Glucose-Capillary: 107 mg/dL — ABNORMAL HIGH (ref 65–99)
Glucose-Capillary: 121 mg/dL — ABNORMAL HIGH (ref 65–99)

## 2016-04-30 LAB — MAGNESIUM: MAGNESIUM: 1.9 mg/dL (ref 1.7–2.4)

## 2016-04-30 LAB — SALICYLATE LEVEL: Salicylate Lvl: <7 mg/dL (ref 2.8–30.0)

## 2016-04-30 LAB — ETHANOL: Alcohol, Ethyl (B): <5 mg/dL (ref <5)

## 2016-04-30 LAB — URINE CULTURE: Culture: NO GROWTH

## 2016-04-30 LAB — TROPONIN I: TROPONIN I: 0.14 ng/mL — AB (ref <0.03)

## 2016-04-30 LAB — PROCALCITONIN: PROCALCITONIN: 1.29 ng/mL

## 2016-04-30 MED ORDER — ALBUTEROL SULFATE (2.5 MG/3ML) 0.083% IN NEBU
5.0000 mg | INHALATION_SOLUTION | Freq: Once | RESPIRATORY_TRACT | Status: AC
  Administered 2016-04-30: 5 mg via RESPIRATORY_TRACT
  Filled 2016-04-30: qty 6

## 2016-04-30 MED ORDER — VITAMIN B-1 100 MG PO TABS: 100.0000 mg | ORAL_TABLET | Freq: Every day | ORAL | Status: DC

## 2016-04-30 MED ORDER — VANCOMYCIN HCL IN DEXTROSE 1-5 GM/200ML-% IV SOLN
1000.0000 mg | Freq: Once | INTRAVENOUS | Status: AC
  Administered 2016-05-01: 1000 mg via INTRAVENOUS
  Filled 2016-04-30: qty 200

## 2016-04-30 MED ORDER — IOPAMIDOL (ISOVUE-370) INJECTION 76%
75.0000 mL | Freq: Once | INTRAVENOUS | Status: AC | PRN
  Administered 2016-04-30: 75 mL via INTRAVENOUS

## 2016-04-30 MED ORDER — HYDROXYZINE HCL 25 MG PO TABS: 50.0000 mg | ORAL_TABLET | Freq: Three times a day (TID) | ORAL | Status: DC | PRN

## 2016-04-30 MED ORDER — AMOXICILLIN-POT CLAVULANATE 875-125 MG PO TABS: 1.0000 | ORAL_TABLET | Freq: Two times a day (BID) | ORAL | 0 refills | Status: DC

## 2016-04-30 MED ORDER — AMOXICILLIN-POT CLAVULANATE 875-125 MG PO TABS: 1.0000 | ORAL_TABLET | Freq: Two times a day (BID) | ORAL | Status: DC

## 2016-04-30 MED ORDER — PIPERACILLIN-TAZOBACTAM 3.375 G IVPB
3.3750 g | Freq: Once | INTRAVENOUS | Status: AC
  Administered 2016-05-01: 3.375 g via INTRAVENOUS
  Filled 2016-04-30: qty 50

## 2016-04-30 NOTE — ED Notes (Signed)
At bedside for 1 on 1.
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
## 2016-04-30 NOTE — Discharge Summary (Signed)
Discharge Summary  Fred MossesJohn Meetze Jr. ZOX:[id number] DOB: 1985/12/18  PCP: Fred ShaggyEnobong, [name], MD  Admit date: 04/28/2016 Discharge date: 04/30/2016  Time spent: >5030mins, more than 50% time spent on coordination of care  Recommendations for Outpatient Follow-up:  1. F/u with PMD within a week  for hospital discharge follow up, repeat cbc/bmp at follow up 2. F/u with psychiatry   Discharge Diagnoses:  Active Hospital Problems   Diagnosis Date Noted  . Accidental drug overdose 04/28/2016  . AKI (acute kidney injury) (HCC)   . Encounter for central line placement   . Difficult intravenous access     Resolved Hospital Problems   Diagnosis Date Noted Date Resolved  No resolved problems to display.    Discharge Condition: stable  Diet recommendation: regular diet  Filed Weights   04/28/16 1000 04/29/16 0500  Weight: 124.5 kg (274 lb 7.6 oz) 125.4 kg (276 lb 7.3 oz)    History of present illness:  Patient with h/o recreational drug use, h/o bipolar, depression, anxiety, h/o drug overdose, found on  on floor by mother unresponsive w/ agonal resp pattern on 11/21. EMS called. Received narcan and manual bag mask ventilation. Became more awake and responsive. Was transported to ER on CPAP. On arrival admitted to heroin use that am. Further eval in ER demonstrated: AKI, hyperkalemia w/ K >7, mild lactic acidosis and RLL airspace disease as well as RUL consolidation. He was placed on Narcan gtt, IVF bolus given and abx initiated in the ER per sepsis protocol, and PCCM asked to admit Patient has right ij central line placed, he improved with ivf and abx, he is transferred to hospitaliast service on 11/23, psych consulted on 11/23 who cleared him to be discharged home .  Hospital Course:  Active Problems:   Accidental drug overdose   AKI (acute kidney injury) (HCC)   Encounter for central line placement   Difficult intravenous access  intentional heroin overdose with acute encephalopathy:  resolved.  Acute hypoxic respiratory failure from drug overdoase,/aspiration pneumonia with sepsis presented on admission he has leukocytosis, lactic acidosis, sinus tachycardia and hypotension on admission He received unasys, then augmentin, ivf, improving, procalcitonin trending down, lactic acid normalized Weaned off  oxygen He is discharged home with oral augmentin and pmd follow up.  AKI; resolved with ivf  Psych: h/o bipolar, substance abuse, suicidal ideation, on suicide precaution, psych consulted , patient denies suicidal ideation, psych cleared patient to discharge home with continue previous psych meds  morbid obesity Body mass index is 50.56 kg/m.  Life style modification   Code Status: full  Family Communication: patient   Disposition Plan: cleared  By psych to discharge home   Consultants:  pccm admit, hospitalist pick up on 11/23  psych  Procedures:  Right ij placement on 11/21, removed on 11/22  Antibiotics:  unasyn then augmentin    Discharge Exam: BP 109/64 (BP Location: Left Arm)   Pulse 70   Temp 98.7 F (37.1 C) (Oral)   Resp 18   Ht 5\' 2"  (1.575 m)   Wt 125.4 kg (276 lb 7.3 oz)   SpO2 95%   BMI 50.56 kg/m   General: NAD, obese Cardiovascular: RRR Respiratory: CTABL  Discharge Instructions You were cared for by a hospitalist during your hospital stay. If you have any questions about your discharge medications or the care you received while you were in the hospital after you are discharged, you can call the unit and asked to speak with the hospitalist on call if  the hospitalist that took care of you is not available. Once you are discharged, your primary care physician will handle any further medical issues. Please note that NO REFILLS for any discharge medications will be authorized once you are discharged, as it is imperative that you return to your primary care physician (or establish a relationship with a primary care  physician if you do not have one) for your aftercare needs so that they can reassess your need for medications and monitor your lab values.  Discharge Instructions    Diet general    Complete by:  As directed    Increase activity slowly    Complete by:  As directed        Medication List    TAKE these medications   amoxicillin-clavulanate 875-125 MG tablet Commonly known as:  AUGMENTIN Take 1 tablet by mouth every 12 (twelve) hours.   citalopram 10 MG tablet Commonly known as:  CELEXA Take 3 tablets (30 mg total) by mouth daily. For depression   divalproex 250 MG 24 hr tablet Commonly known as:  DEPAKOTE ER Take 3 tablets (750 mg total) by mouth daily. For mood stabilization   gabapentin 100 MG capsule Commonly known as:  NEURONTIN Take 2 capsules (200 mg total) by mouth 3 (three) times daily. For agitation   hydrOXYzine 50 MG tablet Commonly known as:  ATARAX/VISTARIL Take 1 tablet (50 mg total) by mouth every 6 (six) hours as needed for anxiety. What changed:  when to take this   nicotine polacrilex 2 MG gum Commonly known as:  NICORETTE Take 1 each (2 mg total) by mouth as needed for smoking cessation.   QUEtiapine 200 MG tablet Commonly known as:  SEROQUEL Take 1 tablet (200 mg total) by mouth at bedtime. For mood control      Allergies  Allergen Reactions  . Bee Venom Anaphylaxis  . Tramadol Diarrhea  . Tylenol [Acetaminophen] Diarrhea   Follow-up Information    Fred Shaggy, MD Follow up in 1 week(s).   Specialty:  Family Medicine Why:  hospital discharge follow up Contact information: [address] [place] [address] [phone]            The results of significant diagnostics from this hospitalization (including imaging, microbiology, ancillary and laboratory) are listed below for reference.    Significant Diagnostic Studies: Dg Chest Port 1 View  Result Date: 04/28/2016 CLINICAL DATA:  Shortness of breath.  Central line  placement. EXAM: PORTABLE CHEST 1 VIEW COMPARISON:  Radiograph of same day. FINDINGS: Stable cardiomediastinal silhouette. Left lung is clear. No pneumothorax is noted. Increased right upper lobe airspace opacity is noted concerning for pneumonia. Interval placement of right internal jugular catheter with distal tip in expected position of the SVC. Bony thorax is unremarkable. IMPRESSION: Worsening right upper lobe pneumonia. Interval placement of right internal jugular catheter with distal tip in expected position of SVC. No pneumothorax is noted. These results were called by telephone at the time of interpretation on 04/28/2016 at 2:55 pm to [facility], RN, who verbally acknowledged these results. Electronically Signed   By: Lupita Raider, M.D.   On: 04/28/2016 14:56   Dg Chest Portable 1 View  Result Date: 04/28/2016 CLINICAL DATA:  Drug overdose.  Wheezing EXAM: PORTABLE CHEST 1 VIEW COMPARISON:  April 14, 2016 FINDINGS: There is airspace consolidation throughout several areas of the right upper lobe, with the greatest consolidation abutting the right minor fissure. Lungs elsewhere clear. Heart is upper normal in  size with pulmonary vascularity within normal limits. No adenopathy. There is postoperative change in the lower cervical spine. IMPRESSION: Airspace consolidation in the right upper lobe. Question pneumonia versus aspiration as most likely etiologies. Lungs elsewhere clear. Stable cardiac prominence. No evident pneumothorax. Electronically Signed   By: Bretta BangWilliam  Woodruff III M.D.   On: 04/28/2016 07:53   Dg Chest Portable 1 View  Result Date: 04/14/2016 CLINICAL DATA:  30 y/o  M; possible narcotic overdose and hypoxia. EXAM: PORTABLE CHEST 1 VIEW COMPARISON:  None. FINDINGS: Diffusely increased interstitial markings. Normal cardiac silhouette. Anterior cervical fusion hardware noted. No acute osseous abnormality evident IMPRESSION: Diffusely increased interstitial markings probably represents  interstitial edema or ARDS in the setting of narcotic overdose. Electronically Signed   By: Mitzi HansenLance  Furusawa-Stratton M.D.   On: 04/14/2016 19:06   Dg Hand Complete Left  Result Date: 04/08/2016 CLINICAL DATA:  30 y/o M; smashed left ring finger between his bruit and a lawnmower. Swelling distally. EXAM: LEFT HAND - COMPLETE 3+ VIEW COMPARISON:  None. FINDINGS: There is no evidence of fracture or dislocation. There is no evidence of arthropathy or other focal bone abnormality. IMPRESSION: No acute fracture or dislocation identified. Electronically Signed   By: Mitzi HansenLance  Furusawa-Stratton M.D.   On: 04/08/2016 04:26    Microbiology: Recent Results (from the past 240 hour(s))  Blood Culture (routine x 2)     Status: None (Preliminary result)   Collection Time: 04/28/16  8:05 AM  Result Value Ref Range Status   Specimen Description BLOOD LEFT ANTECUBITAL  Final   Special Requests IN PEDIATRIC BOTTLE 2ML  Final   Culture   Final    NO GROWTH 2 DAYS [facility]formed at [facility]    Report Status PENDING  Incomplete  MRSA PCR Screening     Status: None   Collection Time: 04/28/16  2:33 PM  Result Value Ref Range Status   MRSA by PCR NEGATIVE NEGATIVE Final    Comment:        The GeneXpert MRSA Assay (FDA approved for NASAL specimens only), is one component of a comprehensive MRSA colonization surveillance program. It is not intended to diagnose MRSA infection nor to guide or monitor treatment for MRSA infections.   Urine culture     Status: None   Collection Time: 04/28/16  3:39 PM  Result Value Ref Range Status   Specimen Description URINE, RANDOM  Final   Special Requests NONE  Final   Culture NO GROWTH [facility]formed at [facility]   Final   Report Status 04/30/2016 FINAL  Final     Labs: Basic Metabolic Panel:  Recent Labs Lab 04/28/16 0702 04/28/16 1400 04/28/16 1845 04/29/16 0903 04/30/16 0721  NA 135 137  --  138  --   K 7.2* 5.3* 4.4 3.9  --   CL 99* 107   --  107  --   CO2 26 22  --  25  --   GLUCOSE 154* 150*  --  101*  --   BUN 22* 22*  --  10  --   CREATININE 1.43* 1.27*  --  0.82  --   CALCIUM 8.8* 7.9*  --  8.0*  --   MG  --   --   --  2.1 1.9  PHOS  --   --   --  1.6*  --    Liver Function Tests:  Recent Labs Lab 04/28/16 0702 04/29/16 0903  AST 43*  --   ALT  32  --   ALKPHOS 83  --   BILITOT 0.5  --   PROT 8.9*  --   ALBUMIN 5.0 3.5   No results for input(s): LIPASE, AMYLASE in the last 168 hours. No results for input(s): AMMONIA in the last 168 hours. CBC:  Recent Labs Lab 04/28/16 0702 04/29/16 0903 04/30/16 0721  WBC 13.8* 10.2 8.5  NEUTROABS 12.1* 7.9* 5.6  HGB 16.1 12.9* 12.9*  HCT 46.9 37.7* 38.0*  MCV 93.4 90.6 92.2  PLT 218 173 179   Cardiac Enzymes: No results for input(s): CKTOTAL, CKMB, CKMBINDEX, TROPONINI in the last 168 hours. BNP: BNP (last 3 results) No results for input(s): BNP in the last 8760 hours.  ProBNP (last 3 results) No results for input(s): PROBNP in the last 8760 hours.  CBG:  Recent Labs Lab 04/29/16 1158 04/29/16 1545 04/30/16 0003 04/30/16 0755 04/30/16 1221  GLUCAP 99 96 121* 110* 107*       Signed:  Eshawn Coor MD, PhD  [facility] 04/30/2016, 3:08 PM
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
## 2016-04-30 NOTE — Progress Notes (Signed)
PROGRESS NOTE  Fred MossesJohn Eldred Jr. WUJ:[id number] DOB: 08-30-1985 DOA: 04/28/2016 PCP: [name], [name], MD  Brief summary: Patient with h/o recreational drug use, h/o bipolar, depression, anxiety, h/o drug overdose, found on  on floor by mother unresponsive w/ agonal resp pattern on 11/21. EMS called. Received narcan and manual bag mask ventilation. Became more awake and responsive. Was transported to ER on CPAP. On arrival admitted to heroin use that am. Further eval in ER demonstrated: AKI, hyperkalemia w/ K >7, mild lactic acidosis and RLL airspace disease as well as RUL consolidation. He was placed on Narcan gtt, IVF bolus given and abx initiated in the ER per sepsis protocol, and PCCM asked to admit Patient has right ij central line placed, he improved with ivf and abx, he is transferred to hospitaliast service on 11/23, psych consulted   HPI/Recap of past 24 hours:  Awake, report some intermittent dry cough, denies pain, no sob, no fever, on room air, He refused iv abx, he wants to go home  Assessment/Plan: Active Problems:   Accidental drug overdose   AKI (acute kidney injury) (HCC)   Encounter for central line placement   Difficult intravenous access  intentional heroin overdose with acute encephalopathy: resolved.  Acute hypoxic respiratory failure from drug overdoase,/aspiration pneumonia with sepsis presented on admission he has leukocytosis, lactic acidosis, sinus tachycardia and hypotension on admission He received unasys, then augmentin, ivf, improving, procalcitonin trending down, lactic acid normalized Weaned off  oxygen  AKI; resolved with ivf  Psych: h/o bipolar, substance abuse, suicidal ideation, on suicide precaution, psych consulted  morbid obesity Body mass index is 50.56 kg/m.  Life style modification   Code Status: full  Family Communication: patient   Disposition Plan: likely will need inpatient psych placement   Consultants:  pccm admit,  hospitalist pick up on 11/23  psych  Procedures:  Right ij placement on 11/21, removed on 11/22  Antibiotics:  unasyn then augmentin   Objective: BP 109/64 (BP Location: Left Arm)   Pulse 70   Temp 98.7 F (37.1 C) (Oral)   Resp 18   Ht 5\' 2"  (1.575 m)   Wt 125.4 kg (276 lb 7.3 oz)   SpO2 95%   BMI 50.56 kg/m   Intake/Output Summary (Last 24 hours) at 04/30/16 1024 Last data filed at 04/29/16 1607  Gross per 24 hour  Intake              620 ml  Output              725 ml  Net             -105 ml   Filed Weights   04/28/16 1000 04/29/16 0500  Weight: 124.5 kg (274 lb 7.6 oz) 125.4 kg (276 lb 7.3 oz)    Exam:   General:  NAD  Cardiovascular: RRR  Respiratory: CTABL  Abdomen: Soft/ND/NT, positive BS  Musculoskeletal: No Edema  Neuro: aaox3  Data Reviewed: Basic Metabolic Panel:  Recent Labs Lab 04/28/16 0702 04/28/16 1400 04/28/16 1845 04/29/16 0903 04/30/16 0721  NA 135 137  --  138  --   K 7.2* 5.3* 4.4 3.9  --   CL 99* 107  --  107  --   CO2 26 22  --  25  --   GLUCOSE 154* 150*  --  101*  --   BUN 22* 22*  --  10  --   CREATININE 1.43* 1.27*  --  0.82  --  CALCIUM 8.8* 7.9*  --  8.0*  --   MG  --   --   --  2.1 1.9  PHOS  --   --   --  1.6*  --    Liver Function Tests:  Recent Labs Lab 04/28/16 0702 04/29/16 0903  AST 43*  --   ALT 32  --   ALKPHOS 83  --   BILITOT 0.5  --   PROT 8.9*  --   ALBUMIN 5.0 3.5   No results for input(s): LIPASE, AMYLASE in the last 168 hours. No results for input(s): AMMONIA in the last 168 hours. CBC:  Recent Labs Lab 04/28/16 0702 04/29/16 0903 04/30/16 0721  WBC 13.8* 10.2 8.5  NEUTROABS 12.1* 7.9* 5.6  HGB 16.1 12.9* 12.9*  HCT 46.9 37.7* 38.0*  MCV 93.4 90.6 92.2  PLT 218 173 179   Cardiac Enzymes:   No results for input(s): CKTOTAL, CKMB, CKMBINDEX, TROPONINI in the last 168 hours. BNP (last 3 results) No results for input(s): BNP in the last 8760 hours.  ProBNP (last 3  results) No results for input(s): PROBNP in the last 8760 hours.  CBG:  Recent Labs Lab 04/29/16 0805 04/29/16 1158 04/29/16 1545 04/30/16 0003 04/30/16 0755  GLUCAP 108* 99 96 121* 110*    Recent Results (from the past 240 hour(s))  Blood Culture (routine x 2)     Status: None (Preliminary result)   Collection Time: 04/28/16  8:05 AM  Result Value Ref Range Status   Specimen Description BLOOD LEFT ANTECUBITAL  Final   Special Requests IN PEDIATRIC BOTTLE 2ML  Final   Culture   Final    NO GROWTH 1 DAY Performed at [facility]    Report Status PENDING  Incomplete  MRSA PCR Screening     Status: None   Collection Time: 04/28/16  2:33 PM  Result Value Ref Range Status   MRSA by PCR NEGATIVE NEGATIVE Final    Comment:        The GeneXpert MRSA Assay (FDA approved for NASAL specimens only), is one component of a comprehensive MRSA colonization surveillance program. It is not intended to diagnose MRSA infection nor to guide or monitor treatment for MRSA infections.   Urine culture     Status: None   Collection Time: 04/28/16  3:39 PM  Result Value Ref Range Status   Specimen Description URINE, RANDOM  Final   Special Requests NONE  Final   Culture NO GROWTH [facility]formed [facility]   Final   Report Status 04/30/2016 FINAL  Final     Studies: No results found.  Scheduled Meds: . ampicillin-sulbactam (UNASYN) IV  3 g Intravenous Q6H  . dextrose  1 ampule Intravenous Once  . heparin  5,000 Units Subcutaneous Q8H  . insulin aspart  0-9 Units Subcutaneous Q4H  . thiamine injection  100 mg Intravenous Daily    Continuous Infusions: . sodium chloride 100 mL/hr at 04/29/16 0801     Time spent: 25mins  Eliott Amparan MD, PhD  [facility] Pager [phone]. If 7PM-7AM, please contact night-coverage at [email or web address], password [facility] 04/30/2016, 10:24 AM  LOS: 2 days

## 2016-04-30 NOTE — ED Notes (Signed)
RN at bedside for 1 on 1

## 2016-04-30 NOTE — Progress Notes (Signed)
Pt ordered for discharge home by Dr. Roda ShuttersXu per psychiatrist Leata MouseJanardhana Jonnalagadda clearance this morning, with IVC revoked per Dr. Roda ShuttersXu. Discharge instructions given to patient, however, he refused to sign them. Pt has no clothes, blue paper scrubs given to patient, with socks. Pt called his mother to come pick him up, however, she informed him that he cannot come to her house, and she is unable to pick him up but may come pick him up later. Informed patient. Pt refusing to wait, throwing phone at staff in room. Pt is alert, oriented x4, with appropriate responses, denies that he is having thoughts of wanting to harm himself or anyone else. Offered to assist patient with calling someone else, patient yelling at staff, "yall can't hold me now, the doctors let me go." Offered patient a wheelchair, patient refusing wheelchair. Patient refusing to wait on a ride. Charge RN updated. Pt walked to main entrance with this RN and sitter, security to standby in main lobby. Security alerted on patient behaviors.

## 2016-04-30 NOTE — Progress Notes (Signed)
Key Points: Use following P&T approved IV to PO antibiotic change policy.  Description contains the criteria that are approved Note: Policy Excludes:  Esophagectomy patientsPHARMACIST - PHYSICIAN COMMUNICATION DR:   Roda ShuttersXu CONCERNING: IV to Oral Route Change Policy  RECOMMENDATION: This patient is receiving thiamine by the intravenous route.  Based on criteria approved by the Pharmacy and Therapeutics Committee, the intravenous medication(s) is/are being converted to the equivalent oral dose form(s).   DESCRIPTION: These criteria include:  The patient is eating (either orally or via tube) and/or has been taking other orally administered medications for a least 24 hours  The patient has no evidence of active gastrointestinal bleeding or impaired GI absorption (gastrectomy, short bowel, patient on TNA or NPO).  If you have questions about this conversion, please contact the Pharmacy Department  []   [phone]( [phone] )  [facility] []   [phone]( [phone] )  [facility] []   [phone]( [phone] )  [facility] []   [phone]( [phone] )  [facility] [x]   [phone]( [phone] )  [facility]   Emily FilbertLilliston, Trust Crago [place]orkMichel[place], [facility] CenterRPH 04/30/2016 12:48 PM

## 2016-04-30 NOTE — ED Notes (Signed)
RN at bedside 1 on 1. Pt verbalizes desire for rehab. Pt tearful when discussing struggles with addiction and verbalizes a desire for change and need for help.

## 2016-04-30 NOTE — Progress Notes (Signed)
Pt is refusing all medications, including iv fluids, iv antibiotics. Importance stressed to patient, however, he continues to refuse, states "I just want to go home for Thanksgiving." Psychiatrist at bedside.

## 2016-04-30 NOTE — ED Triage Notes (Addendum)
[facility], states was dx with pneumonia and given rx for Augmentin but reports will not be able to get medication until Monday. States has been coughing blood up tonight. Pt reports was admitted due to heroin overdose also requesting help for detox from heroin. Pt c/o chest pain and shortness of breath. Pt states tonight "I feel like hurting myself."  Pt reports has overdosed on heroin in the past 2 weeks.

## 2016-05-01 ENCOUNTER — Inpatient Hospital Stay (HOSPITAL_COMMUNITY)
Admit: 2016-05-01 | Discharge: 2016-05-01 | Disposition: A | Discharge status: Home / Self Care | Payer: Self-pay | Attending: Family Medicine | Admitting: Family Medicine

## 2016-05-01 ENCOUNTER — Inpatient Hospital Stay: Payer: Self-pay

## 2016-05-01 ENCOUNTER — Encounter: Payer: Self-pay | Admitting: Nurse Practitioner

## 2016-05-01 DIAGNOSIS — J156 Pneumonia due to other aerobic Gram-negative bacteria: Secondary | ICD-10-CM

## 2016-05-01 DIAGNOSIS — J189 Pneumonia, unspecified organism: Secondary | ICD-10-CM

## 2016-05-01 DIAGNOSIS — R079 Chest pain, unspecified: Secondary | ICD-10-CM

## 2016-05-01 LAB — CBC
HEMATOCRIT: 37.7 % — AB (ref 40.0–52.0)
HEMOGLOBIN: 13 g/dL (ref 13.0–18.0)
MCH: 31.7 pg (ref 26.0–34.0)
MCHC: 34.4 g/dL (ref 32.0–36.0)
MCV: 92.1 fL (ref 80.0–100.0)
Platelets: 192 10*3/uL (ref 150–440)
RBC: 4.09 MIL/uL — ABNORMAL LOW (ref 4.40–5.90)
RDW: 14.3 % (ref 11.5–14.5)
WBC: 8.8 10*3/uL (ref 3.8–10.6)

## 2016-05-01 LAB — RAPID HIV SCREEN (HIV 1/2 AB+AG)
HIV 1/2 Antibodies: NONREACTIVE
HIV-1 P24 Antigen - HIV24: NONREACTIVE

## 2016-05-01 LAB — PHOSPHORUS: Phosphorus: 1.9 mg/dL — ABNORMAL LOW (ref 2.5–4.6)

## 2016-05-01 LAB — BASIC METABOLIC PANEL
ANION GAP: 5 (ref 5–15)
BUN: 10 mg/dL (ref 6–20)
CHLORIDE: 110 mmol/L (ref 101–111)
CO2: 26 mmol/L (ref 22–32)
Calcium: 8.4 mg/dL — ABNORMAL LOW (ref 8.9–10.3)
Creatinine, Ser: 0.94 mg/dL (ref 0.61–1.24)
GFR calc Af Amer: >60 mL/min (ref >60)
GFR calc non Af Amer: >60 mL/min (ref >60)
GLUCOSE: 94 mg/dL (ref 65–99)
POTASSIUM: 3.5 mmol/L (ref 3.5–5.1)
SODIUM: 141 mmol/L (ref 135–145)

## 2016-05-01 LAB — ECHOCARDIOGRAM COMPLETE
Height: 62 in
WEIGHTICAEL: 4160 [oz_av]

## 2016-05-01 LAB — TSH: TSH: 5.241 u[IU]/mL — ABNORMAL HIGH (ref 0.350–4.500)

## 2016-05-01 LAB — TROPONIN I
TROPONIN I: 0.11 ng/mL — AB (ref <0.03)
Troponin I: 0.14 ng/mL (ref <0.03)
Troponin I: 0.1 ng/mL (ref <0.03)

## 2016-05-01 LAB — MAGNESIUM: Magnesium: 1.8 mg/dL (ref 1.7–2.4)

## 2016-05-01 LAB — PROTIME-INR
INR: 1.11
PROTHROMBIN TIME: 14.4 s (ref 11.4–15.2)

## 2016-05-01 LAB — HEPARIN LEVEL (UNFRACTIONATED)
HEPARIN UNFRACTIONATED: 0.11 [IU]/mL — AB (ref 0.30–0.70)
Heparin Unfractionated: 0.27 IU/mL — ABNORMAL LOW (ref 0.30–0.70)

## 2016-05-01 LAB — APTT: aPTT: 30 seconds (ref 24–36)

## 2016-05-01 MED ORDER — DM-GUAIFENESIN ER 30-600 MG PO TB12: 1.0000 | ORAL_TABLET | Freq: Two times a day (BID) | ORAL | Status: DC

## 2016-05-01 MED ORDER — ASPIRIN 325 MG PO TABS
325.0000 mg | ORAL_TABLET | Freq: Once | ORAL | Status: AC
  Administered 2016-05-01: 325 mg via ORAL
  Filled 2016-05-01: qty 1

## 2016-05-01 MED ORDER — DEXTROMETHORPHAN POLISTIREX ER 30 MG/5ML PO SUER
30.0000 mg | Freq: Two times a day (BID) | ORAL | Status: DC
  Filled 2016-05-01 (×2): qty 5

## 2016-05-01 MED ORDER — HEPARIN SODIUM (PORCINE) 5000 UNIT/ML IJ SOLN
5000.0000 [IU] | Freq: Two times a day (BID) | INTRAMUSCULAR | Status: DC
  Administered 2016-05-01 – 2016-05-03 (×4): 5000 [IU] via SUBCUTANEOUS
  Filled 2016-05-01 (×4): qty 1

## 2016-05-01 MED ORDER — SENNOSIDES-DOCUSATE SODIUM 8.6-50 MG PO TABS: 1.0000 | ORAL_TABLET | Freq: Every evening | ORAL | Status: DC | PRN

## 2016-05-01 MED ORDER — HEPARIN BOLUS VIA INFUSION
4000.0000 [IU] | Freq: Once | INTRAVENOUS | Status: AC
  Administered 2016-05-01: 4000 [IU] via INTRAVENOUS
  Filled 2016-05-01: qty 4000

## 2016-05-01 MED ORDER — CITALOPRAM HYDROBROMIDE 20 MG PO TABS
30.0000 mg | ORAL_TABLET | Freq: Every day | ORAL | Status: DC
  Administered 2016-05-01: 30 mg via ORAL
  Filled 2016-05-01: qty 2

## 2016-05-01 MED ORDER — SODIUM CHLORIDE 0.9 % IV SOLN
INTRAVENOUS | Status: DC
  Administered 2016-05-01 (×2): via INTRAVENOUS

## 2016-05-01 MED ORDER — HYDROXYZINE HCL 25 MG PO TABS
50.0000 mg | ORAL_TABLET | Freq: Three times a day (TID) | ORAL | Status: DC | PRN
  Administered 2016-05-02: 50 mg via ORAL
  Filled 2016-05-01: qty 2

## 2016-05-01 MED ORDER — QUETIAPINE FUMARATE 200 MG PO TABS
200.0000 mg | ORAL_TABLET | Freq: Every day | ORAL | Status: DC
  Administered 2016-05-01 – 2016-05-02 (×3): 200 mg via ORAL
  Filled 2016-05-01 (×5): qty 1

## 2016-05-01 MED ORDER — DILTIAZEM HCL ER COATED BEADS 120 MG PO CP24
120.0000 mg | ORAL_CAPSULE | Freq: Every day | ORAL | Status: DC
  Administered 2016-05-02 – 2016-05-03 (×2): 120 mg via ORAL
  Filled 2016-05-01 (×3): qty 1

## 2016-05-01 MED ORDER — DIVALPROEX SODIUM ER 500 MG PO TB24
750.0000 mg | ORAL_TABLET | Freq: Every day | ORAL | Status: DC
  Administered 2016-05-01 – 2016-05-03 (×3): 750 mg via ORAL
  Filled 2016-05-01 (×4): qty 1

## 2016-05-01 MED ORDER — MAGNESIUM CITRATE PO SOLN: 1.0000 | Freq: Once | ORAL | Status: DC | PRN

## 2016-05-01 MED ORDER — NICOTINE POLACRILEX 2 MG MT GUM
2.0000 mg | CHEWING_GUM | OROMUCOSAL | Status: DC | PRN
  Filled 2016-05-01: qty 1

## 2016-05-01 MED ORDER — VANCOMYCIN HCL 10 G IV SOLR
1250.0000 mg | Freq: Three times a day (TID) | INTRAVENOUS | Status: DC
  Administered 2016-05-01 – 2016-05-02 (×3): 1250 mg via INTRAVENOUS
  Filled 2016-05-01 (×8): qty 1250

## 2016-05-01 MED ORDER — ZOLPIDEM TARTRATE 5 MG PO TABS: 5.0000 mg | ORAL_TABLET | Freq: Every evening | ORAL | Status: DC | PRN

## 2016-05-01 MED ORDER — IPRATROPIUM-ALBUTEROL 0.5-2.5 (3) MG/3ML IN SOLN: 3.0000 mL | Freq: Four times a day (QID) | RESPIRATORY_TRACT | Status: DC | PRN

## 2016-05-01 MED ORDER — GUAIFENESIN ER 600 MG PO TB12
600.0000 mg | ORAL_TABLET | Freq: Two times a day (BID) | ORAL | Status: DC
  Administered 2016-05-01 – 2016-05-03 (×6): 600 mg via ORAL
  Filled 2016-05-01 (×6): qty 1

## 2016-05-01 MED ORDER — DEXTROMETHORPHAN POLISTIREX ER 30 MG/5ML PO SUER
30.0000 mg | Freq: Two times a day (BID) | ORAL | Status: DC
  Administered 2016-05-01 – 2016-05-03 (×6): 30 mg via ORAL
  Filled 2016-05-01 (×9): qty 5

## 2016-05-01 MED ORDER — METHADONE HCL 10 MG PO TABS
20.0000 mg | ORAL_TABLET | Freq: Every day | ORAL | Status: DC
  Administered 2016-05-01 – 2016-05-03 (×3): 20 mg via ORAL
  Filled 2016-05-01 (×3): qty 2

## 2016-05-01 MED ORDER — GUAIFENESIN ER 600 MG PO TB12: 600.0000 mg | ORAL_TABLET | Freq: Two times a day (BID) | ORAL | Status: DC

## 2016-05-01 MED ORDER — HEPARIN (PORCINE) IN NACL 100-0.45 UNIT/ML-% IJ SOLN
1100.0000 [IU]/h | INTRAMUSCULAR | Status: DC
  Administered 2016-05-01: 1100 [IU]/h via INTRAVENOUS
  Filled 2016-05-01 (×2): qty 250

## 2016-05-01 MED ORDER — ONDANSETRON HCL 4 MG PO TABS: 4.0000 mg | ORAL_TABLET | Freq: Four times a day (QID) | ORAL | Status: DC | PRN

## 2016-05-01 MED ORDER — BISACODYL 5 MG PO TBEC: 5.0000 mg | DELAYED_RELEASE_TABLET | Freq: Every day | ORAL | Status: DC | PRN

## 2016-05-01 MED ORDER — CITALOPRAM HYDROBROMIDE 20 MG PO TABS
40.0000 mg | ORAL_TABLET | Freq: Every day | ORAL | Status: DC
  Administered 2016-05-02 – 2016-05-03 (×2): 40 mg via ORAL
  Filled 2016-05-01 (×2): qty 2

## 2016-05-01 MED ORDER — PIPERACILLIN-TAZOBACTAM 4.5 G IVPB
4.5000 g | Freq: Three times a day (TID) | INTRAVENOUS | Status: DC
  Administered 2016-05-01 – 2016-05-02 (×4): 4.5 g via INTRAVENOUS
  Filled 2016-05-01 (×7): qty 100

## 2016-05-01 MED ORDER — SODIUM CHLORIDE 0.9% FLUSH
3.0000 mL | Freq: Two times a day (BID) | INTRAVENOUS | Status: DC
  Administered 2016-05-01 – 2016-05-03 (×5): 3 mL via INTRAVENOUS

## 2016-05-01 MED ORDER — GABAPENTIN 100 MG PO CAPS
200.0000 mg | ORAL_CAPSULE | Freq: Three times a day (TID) | ORAL | Status: DC
  Administered 2016-05-01 – 2016-05-03 (×7): 200 mg via ORAL
  Filled 2016-05-01 (×7): qty 2

## 2016-05-01 MED ORDER — ONDANSETRON HCL 4 MG/2ML IJ SOLN: 4.0000 mg | Freq: Four times a day (QID) | INTRAMUSCULAR | Status: DC | PRN

## 2016-05-01 NOTE — Progress Notes (Signed)
Seen via telehealth by Dr Leretha PolSantiago, and 1:1 sitter no longer needed and po med changes recommended. Dr Juliene PinaMody called and updated on progress, sitter order stopped, po meds increased and MD aware of cardiazem being held for low systolic BP/ Current BP is 106 systolic with a HR of 97. IV heparin stopped and IV fluids stopped as patient has eaten x 3 breaksfasts.

## 2016-05-01 NOTE — Progress Notes (Signed)
Called and informed MD on call about patient's troponin level which is 0.14. Will continue to monitor.

## 2016-05-01 NOTE — ED Notes (Signed)
Pt within view of nursing station. Pt calm and cooperative. Sitter at bedside. No needs expressed.
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
## 2016-05-01 NOTE — Progress Notes (Signed)
[facility] [place] [facility]   PATIENT NAME: Fred GlaserJohn Reynolds    MR#:  [id number]  DATE OF BIRTH:  09/21/1985  SUBJECTIVE:   Patient reports hemoptysis and night sweats Patient denying suicidal ideations or homicidal ideations He is hungry  REVIEW OF SYSTEMS:    Review of Systems  Constitutional: Negative.  Negative for chills, fever and malaise/fatigue.       Night sweats  HENT: Negative.  Negative for ear discharge, ear pain, hearing loss, nosebleeds and sore throat.   Eyes: Negative.  Negative for blurred vision and pain.  Respiratory: Positive for cough and hemoptysis. Negative for shortness of breath and wheezing.   Cardiovascular: Negative.  Negative for chest pain, palpitations and leg swelling.  Gastrointestinal: Negative.  Negative for abdominal pain, blood in stool, diarrhea, nausea and vomiting.  Genitourinary: Negative.  Negative for dysuria.  Musculoskeletal: Negative.  Negative for back pain.  Skin: Negative.   Neurological: Negative for dizziness, tremors, speech change, focal weakness, seizures and headaches.  Endo/Heme/Allergies: Negative.  Does not bruise/bleed easily.  Psychiatric/Behavioral: Positive for depression. Negative for hallucinations and suicidal ideas.    Tolerating Diet: yes      DRUG ALLERGIES:   Allergies  Allergen Reactions  . Bee Venom Anaphylaxis  . Tramadol Diarrhea  . Tylenol [Acetaminophen] Diarrhea    VITALS:  Blood pressure (!) 85/40, pulse 79, temperature 97.2 F (36.2 C), resp. rate (!) 26, height 5\' 2"  (1.575 m), weight 117.9 kg (260 lb), SpO2 97 %.  PHYSICAL EXAMINATION:   Physical Exam    LABORATORY PANEL:   CBC  Recent Labs Lab 05/01/16 0425  WBC 8.8  HGB 13.0  HCT 37.7*  PLT 192   ------------------------------------------------------------------------------------------------------------------  Chemistries   Recent Labs Lab 04/30/16 2107 05/01/16 0051 05/01/16 0425  NA 141   --  141  K 3.6  --  3.5  CL 109  --  110  CO2 24  --  26  GLUCOSE 107*  --  94  BUN 9  --  10  CREATININE 0.87  --  0.94  CALCIUM 9.3  --  8.4*  MG  --  1.8  --   AST 73*  --   --   ALT 55  --   --   ALKPHOS 65  --   --   BILITOT 0.6  --   --    ------------------------------------------------------------------------------------------------------------------  Cardiac Enzymes  Recent Labs Lab 04/30/16 2107 05/01/16 0425 05/01/16 0925  TROPONINI 0.14* 0.14* 0.11*   ------------------------------------------------------------------------------------------------------------------  RADIOLOGY:  Ct Angio Chest Pe W And/or Wo Contrast  Result Date: 05/01/2016 CLINICAL DATA:  [facility]. Coughing blood up tonight. Dyspnea and chest pain. Heroin overdose. EXAM: CT ANGIOGRAPHY CHEST WITH CONTRAST TECHNIQUE: Multidetector CT imaging of the chest was performed using the standard protocol during bolus administration of intravenous contrast. Multiplanar CT image reconstructions and MIPs were obtained to evaluate the vascular anatomy. CONTRAST:  75 mL Isovue 370 COMPARISON:  None. FINDINGS: Cardiovascular: There is moderately good opacification of the central and segmental pulmonary arteries. No focal filling defects are demonstrated, providing no evidence of significant pulmonary embolus. Heart size is normal. Normal caliber thoracic aorta. No pericardial effusions. Mediastinum/Nodes: Esophagus is decompressed. No significant lymphadenopathy in the chest. Lungs/Pleura: Diffuse patchy airspace disease with focal areas of consolidation demonstrated throughout the right lung with focal area of patchy airspace disease in the inferior posterior left upper lung. Changes likely to represent  pneumonia. Can't exclude septic emboli given history of IV drug abuse. Aspiration could also potentially have this appearance. Pneumatocele in the left upper lung.  Small right pleural effusion. No pneumothorax. Airways are patent. Upper Abdomen: No acute abnormality. Musculoskeletal: Postoperative changes in the lower cervical spine. Deformity of the manubrium was consistent with old injury. No acute bony abnormalities demonstrated. Review of the MIP images confirms the above findings. IMPRESSION: No evidence of significant pulmonary embolus. Diffuse patchy airspace disease throughout the right lung and focally in the left upper lung. Changes likely to represent pneumonia. Small right pleural effusion. Electronically Signed   By: Burman NievesWilliam  Reynolds M.D.   On: 05/01/2016 00:12     ASSESSMENT AND PLAN:    30 year old male [facility] with heroin overdose and right upper lobe consolidation who presented with left-sided chest pain and hemoptysis along with suicidal ideation.  1. Hemoptysis due to HCAP Patient will need to continue with AFBs to rule out tuberculosis Continue vancomycin and Zosyn Monitor hemoglobin Check HIV Discontinue heparin drip Follow-up on blood culture  2. Elevated troponin in the setting of demand ischemia Cardiology consultation  3. Suicidal ideation: Continue sitter until psychiatry evaluation  4. Opiate use disorder with resolved symptoms: Currently placed on methadone Will need psychiatry evaluation  5. Tobacco dependence: Patient counseled 3 minutes regarding stopping smoking.     Management plans discussed with the patient and he is in agreement.  CODE STATUS: full  TOTAL TIME TAKING CARE OF THIS PATIENT: 33 minutes.     POSSIBLE D/C 2 days, DEPENDING ON CLINICAL CONDITION.   Fred Reynolds M.D on 05/01/2016 at 11:46 AM  Between 7am to 6pm - Pager - [phone] After 6pm go to [email or web address] - password [email or web address]  [facility]  Office  [phone]  CC: Primary care physician; [name], [name], MD  Note: This dictation was prepared with Dragon  dictation along with smaller phrase technology. Any transcriptional errors that result from this process are unintentional.

## 2016-05-01 NOTE — Progress Notes (Signed)
Patient left the ICU unit at around 2030  And went to room 128. AOX4. Patient left with his belongings.

## 2016-05-01 NOTE — Progress Notes (Signed)
Chaplain rounded the unit to provide a compassionate presence and support to the patient. Patient stated overdose was not intentional. Also stated he feels worthless. Recommend to Chaplain A for continued services. Jefm PettyChaplain Nazyia Gaugh [phone]

## 2016-05-01 NOTE — Progress Notes (Signed)
*  PRELIMINARY RESULTS* Echocardiogram 2D Echocardiogram has been performed.  [name], [name] 05/01/2016, 1:02 PM
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
## 2016-05-01 NOTE — Progress Notes (Signed)
DR Larae GroomsVon Damm contacted this am re infectious disease consult, asked that MD call him for collaboration. ICU MD and Dr Juliene PinaMody notified of request
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
## 2016-05-01 NOTE — Progress Notes (Signed)
Pharmacy Antibiotic Note  Fred AxonJohn W Benison Jr. is a 30 y.o. male admitted on 04/30/2016 with pneumonia.  Pharmacy has been consulted for Zosyn and vancomycin dosing.  Plan: 1. Zosyn 4.5 gm IV Q8H EI 2. Vancomycin 1 gm IV x 1 in ED followed in 6 hours (stacked dosing) by vancomycin 1.25 gm IV Q8H, predicted trough 15 mcg/mL, pharmacy will continue to follow and adjust as needed to maintain trough 15 to 20 mcg/mL.   Vd 56 L, Ke 0.119 hr-1, T1/2 5.8 hr  Height: 5\' 2"  (157.5 cm) Weight: 260 lb (117.9 kg) IBW/kg (Calculated) : 54.6  Temp (24hrs), Avg:98.3 F (36.8 C), Min:98 F (36.7 C), Max:98.7 F (37.1 C)   Recent Labs Lab 04/28/16 0702 04/28/16 0800 04/28/16 1400 04/29/16 0903 04/30/16 0721 04/30/16 2107 05/01/16 0425  WBC 13.8*  --   --  10.2 8.5 13.9* 8.8  CREATININE 1.43*  --  1.27* 0.82  --  0.87  --   LATICACIDVEN  --  3.69* 1.6  --   --   --   --     Estimated Creatinine Clearance: 137.8 mL/min (by C-G formula based on SCr of 0.87 mg/dL).    Allergies  Allergen Reactions  . Bee Venom Anaphylaxis  . Tramadol Diarrhea  . Tylenol [Acetaminophen] Diarrhea   Thank you for allowing pharmacy to be a part of this patient's care.  Carola FrostNathan A Westlynn Fifer, Pharm.D., BCPS Clinical Pharmacist 05/01/2016 5:34 AM
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
## 2016-05-02 ENCOUNTER — Inpatient Hospital Stay: Payer: Self-pay

## 2016-05-02 LAB — ACID FAST SMEAR (AFB): ACID FAST SMEAR - AFSCU2: NEGATIVE

## 2016-05-02 LAB — CBC
HEMATOCRIT: 38 % — AB (ref 40.0–52.0)
HEMOGLOBIN: 12.8 g/dL — AB (ref 13.0–18.0)
MCH: 31.6 pg (ref 26.0–34.0)
MCHC: 33.8 g/dL (ref 32.0–36.0)
MCV: 93.6 fL (ref 80.0–100.0)
Platelets: 215 10*3/uL (ref 150–440)
RBC: 4.06 MIL/uL — AB (ref 4.40–5.90)
RDW: 14.3 % (ref 11.5–14.5)
WBC: 7.8 10*3/uL (ref 3.8–10.6)

## 2016-05-02 LAB — BASIC METABOLIC PANEL
Anion gap: 4 — ABNORMAL LOW (ref 5–15)
BUN: 10 mg/dL (ref 6–20)
CHLORIDE: 110 mmol/L (ref 101–111)
CO2: 25 mmol/L (ref 22–32)
CREATININE: 0.84 mg/dL (ref 0.61–1.24)
Calcium: 8.3 mg/dL — ABNORMAL LOW (ref 8.9–10.3)
GFR calc Af Amer: >60 mL/min (ref >60)
GFR calc non Af Amer: >60 mL/min (ref >60)
Glucose, Bld: 96 mg/dL (ref 65–99)
POTASSIUM: 3.8 mmol/L (ref 3.5–5.1)
Sodium: 139 mmol/L (ref 135–145)

## 2016-05-02 LAB — EXPECTORATED SPUTUM ASSESSMENT W GRAM STAIN, RFLX TO RESP C

## 2016-05-02 LAB — VANCOMYCIN, TROUGH: Vancomycin Tr: 11 ug/mL — ABNORMAL LOW (ref 15–20)

## 2016-05-02 LAB — EXPECTORATED SPUTUM ASSESSMENT W REFEX TO RESP CULTURE: SPECIAL REQUESTS: NORMAL

## 2016-05-02 MED ORDER — PIPERACILLIN-TAZOBACTAM 4.5 G IVPB
4.5000 g | Freq: Three times a day (TID) | INTRAVENOUS | Status: DC
  Administered 2016-05-02 – 2016-05-03 (×3): 4.5 g via INTRAVENOUS
  Filled 2016-05-02 (×5): qty 100

## 2016-05-02 MED ORDER — VANCOMYCIN HCL 10 G IV SOLR
1250.0000 mg | Freq: Three times a day (TID) | INTRAVENOUS | Status: DC
  Administered 2016-05-02 – 2016-05-03 (×3): 1250 mg via INTRAVENOUS
  Filled 2016-05-02 (×5): qty 1250
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
## 2016-05-02 NOTE — Progress Notes (Signed)
[facility] [place] [facility]   PATIENT NAME: Fred Reynolds    MR#:  [id number]  DATE OF BIRTH:  [birth year]  SUBJECTIVE:   Patient denies SI SOB improved No chest pain  REVIEW OF SYSTEMS:    Review of Systems  Constitutional: Negative.  Negative for chills, fever and malaise/fatigue.  HENT: Negative.  Negative for ear discharge, ear pain, hearing loss, nosebleeds and sore throat.   Eyes: Negative.  Negative for blurred vision and pain.  Respiratory: Positive for cough. Negative for shortness of breath and wheezing.   Cardiovascular: Negative.  Negative for chest pain, palpitations and leg swelling.  Gastrointestinal: Negative.  Negative for abdominal pain, blood in stool, diarrhea, nausea and vomiting.  Genitourinary: Negative.  Negative for dysuria.  Musculoskeletal: Negative.  Negative for back pain.  Skin: Negative.   Neurological: Negative for dizziness, tremors, speech change, focal weakness, seizures and headaches.  Endo/Heme/Allergies: Negative.  Does not bruise/bleed easily.  Psychiatric/Behavioral: Positive for depression. Negative for hallucinations and suicidal ideas.    Tolerating Diet: yes      DRUG ALLERGIES:   Allergies  Allergen Reactions  . Bee Venom Anaphylaxis  . Tramadol Diarrhea  . Tylenol [Acetaminophen] Diarrhea    VITALS:  Blood pressure (!) 105/52, pulse (!) 57, temperature 98 F (36.7 C), temperature source Oral, resp. rate 20, height 5\' 2"  (1.575 m), weight 117.9 kg (260 lb), SpO2 93 %.  PHYSICAL EXAMINATION:   Physical Exam  Constitutional: He is oriented to person, place, and time and well-developed, well-nourished, and in no distress. No distress.  HENT:  Head: Normocephalic.  Eyes: No scleral icterus.  Neck: Normal range of motion. Neck supple. No JVD present. No tracheal deviation present.  Cardiovascular: Normal rate, regular rhythm and normal heart sounds.  Exam reveals no gallop and no friction rub.    No murmur heard. Pulmonary/Chest: Effort normal and breath sounds normal. No respiratory distress. He has no wheezes. He has no rales. He exhibits no tenderness.  Abdominal: Soft. Bowel sounds are normal. He exhibits no distension and no mass. There is no tenderness. There is no rebound and no guarding.  Musculoskeletal: Normal range of motion. He exhibits no edema.  Neurological: He is alert and oriented to person, place, and time.  Skin: Skin is warm. No rash noted. No erythema.  Psychiatric: Affect and judgment normal.      LABORATORY PANEL:   CBC  Recent Labs Lab 05/02/16 0547  WBC 7.8  HGB 12.8*  HCT 38.0*  PLT 215   ------------------------------------------------------------------------------------------------------------------  Chemistries   Recent Labs Lab 04/30/16 2107 05/01/16 0051  05/02/16 0547  NA 141  --   < > 139  K 3.6  --   < > 3.8  CL 109  --   < > 110  CO2 24  --   < > 25  GLUCOSE 107*  --   < > 96  BUN 9  --   < > 10  CREATININE 0.87  --   < > 0.84  CALCIUM 9.3  --   < > 8.3*  MG  --  1.8  --   --   AST 73*  --   --   --   ALT 55  --   --   --   ALKPHOS 65  --   --   --   BILITOT 0.6  --   --   --   < > = values in this interval not  displayed. ------------------------------------------------------------------------------------------------------------------  Cardiac Enzymes  Recent Labs Lab 05/01/16 0425 05/01/16 0925 05/01/16 1341  TROPONINI 0.14* 0.11* 0.10*   ------------------------------------------------------------------------------------------------------------------  RADIOLOGY:  Dg Chest 1 View  Result Date: 05/02/2016 CLINICAL DATA:  Dyspnea EXAM: CHEST 1 VIEW COMPARISON:  05/01/2016 FINDINGS: Cardiac shadow is mildly enlarged. Postsurgical changes are noted in the cervical spine. The lungs are well aerated bilaterally. The left lung remains clear. Patchy infiltrative changes are again noted on the right similar to that  seen on the prior exam. IMPRESSION: Stable patchy right-sided infiltrates. Electronically Signed   By: Alcide CleverMark  Lukens M.D.   On: 05/02/2016 07:23   Ct Angio Chest Pe W And/or Wo Contrast  Result Date: 05/01/2016 CLINICAL DATA:  [facility] with diagnosis of pneumonia. Coughing blood up tonight. Dyspnea and chest pain. Heroin overdose. EXAM: CT ANGIOGRAPHY CHEST WITH CONTRAST TECHNIQUE: Multidetector CT imaging of the chest was performed using the standard protocol during bolus administration of intravenous contrast. Multiplanar CT image reconstructions and MIPs were obtained to evaluate the vascular anatomy. CONTRAST:  75 mL Isovue 370 COMPARISON:  None. FINDINGS: Cardiovascular: There is moderately good opacification of the central and segmental pulmonary arteries. No focal filling defects are demonstrated, providing no evidence of significant pulmonary embolus. Heart size is normal. Normal caliber thoracic aorta. No pericardial effusions. Mediastinum/Nodes: Esophagus is decompressed. No significant lymphadenopathy in the chest. Lungs/Pleura: Diffuse patchy airspace disease with focal areas of consolidation demonstrated throughout the right lung with focal area of patchy airspace disease in the inferior posterior left upper lung. Changes likely to represent pneumonia. Can't exclude septic emboli given history of IV drug abuse. Aspiration could also potentially have this appearance. Pneumatocele in the left upper lung. Small right pleural effusion. No pneumothorax. Airways are patent. Upper Abdomen: No acute abnormality. Musculoskeletal: Postoperative changes in the lower cervical spine. Deformity of the manubrium was consistent with old injury. No acute bony abnormalities demonstrated. Review of the MIP images confirms the above findings. IMPRESSION: No evidence of significant pulmonary embolus. Diffuse patchy airspace disease throughout the right lung and focally in the left upper lung.  Changes likely to represent pneumonia. Small right pleural effusion. Electronically Signed   By: Burman NievesWilliam  Stevens M.D.   On: 05/01/2016 00:12   Dg Chest Port 1 View  Result Date: 05/01/2016 CLINICAL DATA:  Follow-up pneumonia EXAM: PORTABLE CHEST 1 VIEW COMPARISON:  CT from earlier in the same day FINDINGS: Cardiac shadow is stable. The left lung is well aerated with only minimal atelectatic changes in the upper lobe. Patchy infiltrative changes are again seen in the right lung similar to that noted on prior CT examination. Postsurgical changes in the cervical spine are seen. No acute bony abnormality is noted. IMPRESSION: Patchy right-sided infiltrates similar to that noted on prior CT. Electronically Signed   By: Alcide CleverMark  Lukens M.D.   On: 05/01/2016 12:08     ASSESSMENT AND PLAN:    30 year old male recently discharged from [facility] on November 23 with heroin overdose and right upper lobe consolidation who presented with left-sided chest pain and hemoptysis along with suicidal ideation.  1. Hemoptysis due to HCAP Patient will need to continue with AFBs to rule out tuberculosis Continue vancomycin and Zosyn Monitor hemoglobin HIV negative Follow-up on blood culture  2. Elevated troponin in the setting of demand ischemia Cardiology consultation appreciated Consider outpatient stress test if he has recurrent CP Echo shows nml LV and no WMA.   3. Suicidal ideation: Sitter d/c as  patient is no longer suicidal as per Tele Psych consult  4. Opiate use disorder :Currently placed on methadone   5. Tobacco dependence: Patient counseled 3 minutes regarding stopping smoking.     Management plans discussed with the patient and he is in agreement.  CODE STATUS: full  TOTAL TIME TAKING CARE OF THIS PATIENT: 24 minutes.     POSSIBLE D/C 2 days, DEPENDING ON CLINICAL CONDITION.   Kailiana Granquist M.D on 05/02/2016 at 9:02 AM  Between 7am to 6pm - Pager - [phone] After  6pm go to [email or web address] - password [email or web address]  [facility]  Office  [phone]  CC: Primary care physician; [name], [name], MD  Note: This dictation was prepared with Dragon dictation along with smaller phrase technology. Any transcriptional errors that result from this process are unintentional.

## 2016-05-02 NOTE — Progress Notes (Signed)
Pharmacy Antibiotic Note  Fred AxonJohn W Geurin Jr. is a 30 y.o. male admitted on 04/30/2016 with pneumonia.  Pharmacy has been consulted for Zosyn and vancomycin dosing.  Plan: 1. Continue  Zosyn 4.5 gm IV Q8H EI 2. Vanc level today:11  Not true trough- patients morning dose was given 4 hours late. Will continue vancomycin 1.25 gm IV Q8H. Will draw trough after 3 more doses.    Vd 56 L, Ke 0.119 hr-1, T1/2 5.8 hr  Height: 5\' 2"  (157.5 cm) Weight: 260 lb (117.9 kg) IBW/kg (Calculated) : 54.6  Temp (24hrs), Avg:97.9 F (36.6 C), Min:97.8 F (36.6 C), Max:98 F (36.7 C)   Recent Labs Lab 04/28/16 0800 04/28/16 1400 04/29/16 0903 04/30/16 0721 04/30/16 2107 05/01/16 0425 05/02/16 0547 05/02/16 1736  WBC  --   --  10.2 8.5 13.9* 8.8 7.8  --   CREATININE  --  1.27* 0.82  --  0.87 0.94 0.84  --   LATICACIDVEN 3.69* 1.6  --   --   --   --   --   --   VANCOTROUGH  --   --   --   --   --   --   --  11*    Estimated Creatinine Clearance: 142.7 mL/min (by C-G formula based on SCr of 0.84 mg/dL).    Allergies  Allergen Reactions  . Bee Venom Anaphylaxis  . Tramadol Diarrhea  . Tylenol [Acetaminophen] Diarrhea   Thank you for allowing pharmacy to be a part of this patient's care.  Fred Reynolds M Fred Reynolds, Pharm.D. Clinical Pharmacist 05/02/2016 6:59 PM

## 2016-05-03 ENCOUNTER — Inpatient Hospital Stay
Admission: AD | Admit: 2016-05-03 | Discharge: 2016-05-07 | DRG: 885 | Disposition: A | Discharge status: Home / Self Care | Payer: No Typology Code available for payment source | Source: Intra-hospital | Attending: Psychiatry | Admitting: Psychiatry

## 2016-05-03 DIAGNOSIS — G4700 Insomnia, unspecified: Secondary | ICD-10-CM | POA: Diagnosis present

## 2016-05-03 DIAGNOSIS — Z6841 Body Mass Index (BMI) 40.0 and over, adult: Secondary | ICD-10-CM

## 2016-05-03 DIAGNOSIS — Z590 Homelessness: Secondary | ICD-10-CM | POA: Diagnosis not present

## 2016-05-03 DIAGNOSIS — F17200 Nicotine dependence, unspecified, uncomplicated: Secondary | ICD-10-CM | POA: Diagnosis present

## 2016-05-03 DIAGNOSIS — J189 Pneumonia, unspecified organism: Secondary | ICD-10-CM | POA: Diagnosis present

## 2016-05-03 DIAGNOSIS — F1410 Cocaine abuse, uncomplicated: Secondary | ICD-10-CM | POA: Diagnosis present

## 2016-05-03 DIAGNOSIS — Z811 Family history of alcohol abuse and dependence: Secondary | ICD-10-CM

## 2016-05-03 DIAGNOSIS — F1120 Opioid dependence, uncomplicated: Secondary | ICD-10-CM | POA: Diagnosis present

## 2016-05-03 DIAGNOSIS — J45909 Unspecified asthma, uncomplicated: Secondary | ICD-10-CM | POA: Diagnosis present

## 2016-05-03 DIAGNOSIS — F17210 Nicotine dependence, cigarettes, uncomplicated: Secondary | ICD-10-CM | POA: Diagnosis present

## 2016-05-03 DIAGNOSIS — F4310 Post-traumatic stress disorder, unspecified: Secondary | ICD-10-CM | POA: Diagnosis present

## 2016-05-03 DIAGNOSIS — F1123 Opioid dependence with withdrawal: Secondary | ICD-10-CM | POA: Diagnosis present

## 2016-05-03 DIAGNOSIS — E669 Obesity, unspecified: Secondary | ICD-10-CM | POA: Diagnosis present

## 2016-05-03 DIAGNOSIS — F1121 Opioid dependence, in remission: Secondary | ICD-10-CM | POA: Diagnosis present

## 2016-05-03 DIAGNOSIS — Z62810 Personal history of physical and sexual abuse in childhood: Secondary | ICD-10-CM | POA: Diagnosis present

## 2016-05-03 DIAGNOSIS — F332 Major depressive disorder, recurrent severe without psychotic features: Principal | ICD-10-CM | POA: Diagnosis present

## 2016-05-03 DIAGNOSIS — F1220 Cannabis dependence, uncomplicated: Secondary | ICD-10-CM | POA: Diagnosis present

## 2016-05-03 DIAGNOSIS — Z79899 Other long term (current) drug therapy: Secondary | ICD-10-CM

## 2016-05-03 DIAGNOSIS — E221 Hyperprolactinemia: Secondary | ICD-10-CM | POA: Diagnosis present

## 2016-05-03 DIAGNOSIS — F1310 Sedative, hypnotic or anxiolytic abuse, uncomplicated: Secondary | ICD-10-CM | POA: Diagnosis present

## 2016-05-03 DIAGNOSIS — F419 Anxiety disorder, unspecified: Secondary | ICD-10-CM | POA: Diagnosis present

## 2016-05-03 DIAGNOSIS — F319 Bipolar disorder, unspecified: Secondary | ICD-10-CM | POA: Diagnosis present

## 2016-05-03 DIAGNOSIS — R45851 Suicidal ideations: Secondary | ICD-10-CM | POA: Diagnosis present

## 2016-05-03 DIAGNOSIS — L03113 Cellulitis of right upper limb: Secondary | ICD-10-CM | POA: Diagnosis present

## 2016-05-03 DIAGNOSIS — F1421 Cocaine dependence, in remission: Secondary | ICD-10-CM | POA: Diagnosis present

## 2016-05-03 LAB — CULTURE, RESPIRATORY: SPECIAL REQUESTS: NORMAL

## 2016-05-03 LAB — CULTURE, RESPIRATORY W GRAM STAIN: Culture: NORMAL

## 2016-05-03 LAB — CULTURE, BLOOD (ROUTINE X 2): CULTURE: NO GROWTH

## 2016-05-03 MED ORDER — SULFAMETHOXAZOLE-TRIMETHOPRIM 800-160 MG PO TABS
1.0000 | ORAL_TABLET | Freq: Two times a day (BID) | ORAL | Status: DC
  Administered 2016-05-03 – 2016-05-07 (×8): 1 via ORAL
  Filled 2016-05-03 (×8): qty 1

## 2016-05-03 MED ORDER — DILTIAZEM HCL ER COATED BEADS 120 MG PO CP24: 120.0000 mg | ORAL_CAPSULE | Freq: Every day | ORAL | 0 refills | Status: DC

## 2016-05-03 MED ORDER — GABAPENTIN 100 MG PO CAPS
200.0000 mg | ORAL_CAPSULE | Freq: Three times a day (TID) | ORAL | Status: DC
  Administered 2016-05-03 – 2016-05-07 (×10): 200 mg via ORAL
  Filled 2016-05-03 (×11): qty 2

## 2016-05-03 MED ORDER — LEVOFLOXACIN 750 MG PO TABS: 750.0000 mg | ORAL_TABLET | Freq: Every day | ORAL | 0 refills | Status: DC

## 2016-05-03 MED ORDER — ALUM & MAG HYDROXIDE-SIMETH 200-200-20 MG/5ML PO SUSP
30.0000 mL | ORAL | Status: DC | PRN
Start: 2016-05-03 — End: 2016-05-07

## 2016-05-03 MED ORDER — HYDROXYZINE HCL 50 MG PO TABS: 50.0000 mg | ORAL_TABLET | Freq: Three times a day (TID) | ORAL | 0 refills | Status: DC | PRN

## 2016-05-03 MED ORDER — SULFAMETHOXAZOLE-TRIMETHOPRIM 800-160 MG PO TABS: 1.0000 | ORAL_TABLET | Freq: Two times a day (BID) | ORAL | Status: DC

## 2016-05-03 MED ORDER — HYDROXYZINE HCL 50 MG PO TABS
50.0000 mg | ORAL_TABLET | Freq: Three times a day (TID) | ORAL | Status: DC | PRN
Start: 2016-05-03 — End: 2016-05-05
  Administered 2016-05-03 – 2016-05-04 (×3): 50 mg via ORAL
  Filled 2016-05-03 (×3): qty 1

## 2016-05-03 MED ORDER — CYCLOBENZAPRINE HCL 10 MG PO TABS
10.0000 mg | ORAL_TABLET | Freq: Three times a day (TID) | ORAL | Status: DC | PRN
Start: 2016-05-03 — End: 2016-05-05

## 2016-05-03 MED ORDER — SULFAMETHOXAZOLE-TRIMETHOPRIM 800-160 MG PO TABS: 1.0000 | ORAL_TABLET | Freq: Two times a day (BID) | ORAL | 0 refills | Status: DC

## 2016-05-03 MED ORDER — LOPERAMIDE HCL 2 MG PO CAPS: 2.0000 mg | ORAL_CAPSULE | ORAL | Status: DC | PRN

## 2016-05-03 MED ORDER — QUETIAPINE FUMARATE 200 MG PO TABS
200.0000 mg | ORAL_TABLET | Freq: Every day | ORAL | Status: DC
  Administered 2016-05-03 – 2016-05-06 (×4): 200 mg via ORAL
  Filled 2016-05-03 (×4): qty 1

## 2016-05-03 MED ORDER — DIVALPROEX SODIUM ER 500 MG PO TB24
750.0000 mg | ORAL_TABLET | Freq: Every day | ORAL | Status: DC
  Administered 2016-05-03 – 2016-05-07 (×5): 750 mg via ORAL
  Filled 2016-05-03 (×7): qty 1

## 2016-05-03 MED ORDER — CITALOPRAM HYDROBROMIDE 20 MG PO TABS
40.0000 mg | ORAL_TABLET | Freq: Every day | ORAL | Status: DC
  Administered 2016-05-04 – 2016-05-07 (×4): 40 mg via ORAL
  Filled 2016-05-03 (×4): qty 2

## 2016-05-03 MED ORDER — DILTIAZEM HCL ER COATED BEADS 120 MG PO CP24
120.0000 mg | ORAL_CAPSULE | Freq: Every day | ORAL | Status: DC
  Administered 2016-05-04 – 2016-05-06 (×3): 120 mg via ORAL
  Filled 2016-05-03 (×4): qty 1

## 2016-05-03 MED ORDER — LEVOFLOXACIN 750 MG PO TABS
750.0000 mg | ORAL_TABLET | Freq: Every day | ORAL | Status: DC
  Administered 2016-05-03 – 2016-05-06 (×4): 750 mg via ORAL
  Filled 2016-05-03 (×4): qty 1

## 2016-05-03 MED ORDER — LEVOFLOXACIN 750 MG PO TABS: 750.0000 mg | ORAL_TABLET | Freq: Every day | ORAL | Status: DC

## 2016-05-03 MED ORDER — MAGNESIUM HYDROXIDE 400 MG/5ML PO SUSP: 30.0000 mL | Freq: Every day | ORAL | Status: DC | PRN

## 2016-05-03 MED ORDER — CITALOPRAM HYDROBROMIDE 40 MG PO TABS: 40.0000 mg | ORAL_TABLET | Freq: Every day | ORAL | 0 refills | Status: DC
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
## 2016-05-03 NOTE — Progress Notes (Signed)
Patient ID: Fred AxonJohn W Boley Jr., male   DOB: May 17, 1986, 30 y.o.   MRN: [id number]  Patient asked CSW if he can get Methadone prescribed while he is here on the unit. CSW informed patient that his assigned doctor will make that determination. Patient then stated "Well if I don't get Methadone I probably will just leave and go somewhere else where I can." CSW informed patient's nurse, Vic BlackbirdJennifer Minter RN.   Larnie Heart G. Garnette CzechSampson MSW, [facility] 05/03/2016 4:47 PM

## 2016-05-03 NOTE — Tx Team (Signed)
[facility]. Denies SI.HI or AVH at this time. Skin assessment completed. Patient was searched and no contraband was found. Belonging to locker.  Oriented to milieu, unit, and staff. Remains on Q15 minute checks for safety. States he is lonely, depressed, feels worthless, feels negative about himself. Stated " I don't know who I am and why I keep hurting myself."  Product/process development scientistTold writer that he has overdosed on heroin tice in the past couple of weeks. Per Child psychotherapistocial Worker, Dewitt HoesAmaris S, whne patient asked about methadone she told him that it would be up to the discretion of the MD. When he got that information he stated he would go elsewhere to get the methadone. Other than that patient has no other concerns. Will continue to monitor.

## 2016-05-03 NOTE — Progress Notes (Signed)
Patient was going to be d/ced today, but he's currently homeless and the shelters are all full.  He told the CSW who was trying to find him a place that he is a risk to himself.  Patient has expressed suicidal ideation during this admission in CCU.  He's overdosed twice recently on heroin.  [facility] and was admitted here the same day for Left sided chest pain and hemoptysis and SOB.  Determined to have community acquired PNA and has been on IV abx.  3 acid fast tests were done to r/o TB.  First one has come back negative and it was deemed safe to take him off of airborne precautions.  IVs were removed and patient is being d/ced down to [facility].  Report was called to BerryJen, Charity fundraiserN.
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
## 2016-05-03 NOTE — Progress Notes (Signed)
Patient remains 1:1 with sitter for safety. Will continue to monitor.

## 2016-05-03 NOTE — Clinical Social Work Note (Addendum)
CSW visited patient to discuss dc planning and his options for ASAM level 3 services that are available in the [place] area for those with no insurance, including [facility] [place] (referral sent by CSW with verbal permission from patient) and contacting [facility]. The patient indicated that his parents refuse to assist him further with transportation or housing due to past discord connected with his behaviors when actively using. While the CSW attempted to reach staff at local homeless shelters, the patient verbalized that he would "like to kill [himself] since no one can help [him]. [He wishes] it would all just end". CSW offered emotional support and used motivational interview to elicit the patient's stage of change. Currently, the patient is in the preparation stage of change as he is researching options for services. However, due to his co-occurring mental illness and his verbalization of suicidal ideation, the CSW alerted his attending for possible need for psychiatric assessment.   CSW alerted that patient will be accepted on BMU. CSW visited patient to discuss goals of care. The patient indicates that he is willing to consider inpatient substance treatment once he is behaviorally stable.   CSW signing off.  Argentina PonderKaren Martha Britteny Fiebelkorn, MSW, LCSW-A [phone]
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
## 2016-05-03 NOTE — BH Specialist Note (Signed)
This Clinical research associatewriter received a all from Tobi BastosAnna, Camera operatorfloor secretary requesting a psych consult per Dr. Juliene PinaMody.  She was informed [facility] and instructed to contact ED secretary for guidance if needed.       Maryelizabeth Rowanressa Rickeya Manus, MSW, Tinnie GensLCSW, LCAS, CCSI [facility] Triage Specialist [phone]
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
## 2016-05-03 NOTE — Tx Team (Signed)
Initial Treatment Plan 05/03/2016 3:58 PM Fred AxonJohn W Bartoli Jr. [id number]    PATIENT STRESSORS: Financial difficulties Health problems Substance abuse   PATIENT STRENGTHS: Active sense of humor Capable of independent living Motivation for treatment/growth   PATIENT IDENTIFIED PROBLEMS: Depression Hypertension  Anxiety  Asthma  Bipolar Disorder  IV drug use             DISCHARGE CRITERIA:  Ability to meet basic life and health needs Adequate post-discharge living arrangements Improved stabilization in mood, thinking, and/or behavior Verbal commitment to aftercare and medication compliance  PRELIMINARY DISCHARGE PLAN: Attend aftercare/continuing care group Outpatient therapy Placement in alternative living arrangements  PATIENT/FAMILY INVOLVEMENT: This treatment plan has been presented to and reviewed with the patient, Fred AxonJohn W Railey Jr., and/or family member. The patient and family have been given the opportunity to ask questions and make suggestions.  Dagoberto LigasJennifer S Sanjana Folz, RN 05/03/2016, 3:58 PM

## 2016-05-04 ENCOUNTER — Encounter: Payer: Self-pay | Admitting: Psychiatry

## 2016-05-04 DIAGNOSIS — F332 Major depressive disorder, recurrent severe without psychotic features: Principal | ICD-10-CM

## 2016-05-04 LAB — LIPID PANEL
CHOL/HDL RATIO: 2.7 ratio
CHOLESTEROL: 129 mg/dL (ref 0–200)
HDL: 47 mg/dL (ref >40)
LDL Cholesterol: 60 mg/dL (ref 0–99)
TRIGLYCERIDES: 112 mg/dL (ref <150)
VLDL: 22 mg/dL (ref 0–40)

## 2016-05-04 LAB — ACID FAST SMEAR (AFB)

## 2016-05-04 LAB — ACID FAST SMEAR (AFB, MYCOBACTERIA): Acid Fast Smear: NEGATIVE

## 2016-05-04 LAB — VITAMIN B12: Vitamin B-12: 525 pg/mL (ref 180–914)

## 2016-05-04 LAB — FOLATE: FOLATE: 9.8 ng/mL (ref >5.9)

## 2016-05-04 MED ORDER — IBUPROFEN 400 MG PO TABS
800.0000 mg | ORAL_TABLET | Freq: Four times a day (QID) | ORAL | Status: DC | PRN
  Administered 2016-05-04: 800 mg via ORAL
  Filled 2016-05-04: qty 2

## 2016-05-04 MED ORDER — NICOTINE 21 MG/24HR TD PT24
21.0000 mg | MEDICATED_PATCH | Freq: Every day | TRANSDERMAL | Status: DC
  Administered 2016-05-07: 21 mg via TRANSDERMAL
  Filled 2016-05-04: qty 1

## 2016-05-04 NOTE — Plan of Care (Signed)
Problem: Education: Goal: Ability to state activities that reduce stress will improve Outcome: Not Progressing Pt new to unit slept most of evening due to lack of sleep today [place] RN

## 2016-05-04 NOTE — Progress Notes (Signed)
Recreation Therapy Notes  At approximately 3:20 pm, LRT attempted assessment. Patient requested for LRT to come back tomorrow to complete assessment.  Jacquelynn CreeGreene,Alonte Wulff M, LRT/CTRS 05/04/2016 4:34 PM

## 2016-05-04 NOTE — Plan of Care (Signed)
Problem: Activity: Goal: Interest or engagement in leisure activities will improve Outcome: Not Progressing Patient continues to isolate to self and room.
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
## 2016-05-04 NOTE — Progress Notes (Signed)
D: Patient is alert and oriented on the unit this shift. Patient not  attended and actively participated in groups today. Patient denies suicidal ideation, homicidal ideation, auditory or visual hallucinations at the present time.  A: Scheduled medications are administered to patient as per MD orders. Emotional support and encouragement are provided. Patient is maintained on q.15 minute safety checks. Patient is informed to notify staff with questions or concerns. R: No adverse medication reactions are noted. Patient is cooperative with medication administration  . Patient is receptive, calm,depressed and cooperative on the unit at this time. Patient does not interact  with others on the unit this shift. Patient contracts for safety at this time. Patient remains safe at this time. Anxiety 6/10  Depression 6/10

## 2016-05-04 NOTE — Progress Notes (Signed)
Patient is alert and orient x 4. Isolates to self and room. Medication compliant but does not attend group. Denies SI, HI, AVH. Reports anxiety at rate of 6 and withdrawal at 10. CIWA continues  Patient upset this am reports he has been getting methadone for the past few days in hospital. Writer informed patient that methadone was not ordered at this time. Pt has minimal staff and peer interaction. Comes out for meals and medications only.  Encouragement and support offered. Pt receptive and remains safe on unit with q 15 min checks. Will continue to assess and monitor for safety.

## 2016-05-05 LAB — QUANTIFERON IN TUBE
QFT TB AG MINUS NIL VALUE: <0 IU/mL
QUANTIFERON MITOGEN VALUE: 8.89 [IU]/mL
QUANTIFERON NIL VALUE: 0.05 [IU]/mL
QUANTIFERON TB AG VALUE: 0.03 IU/mL
QUANTIFERON TB GOLD: NEGATIVE

## 2016-05-05 LAB — HEMOGLOBIN A1C
Hgb A1c MFr Bld: 5.4 % (ref 4.8–5.6)
MEAN PLASMA GLUCOSE: 108 mg/dL

## 2016-05-05 LAB — HEPATITIS C ANTIBODY: HCV Ab: <0.1 s/co ratio (ref 0.0–0.9)

## 2016-05-05 LAB — PROLACTIN: PROLACTIN: 25 ng/mL — AB (ref 4.0–15.2)

## 2016-05-05 LAB — GLUCOSE, CAPILLARY: Glucose-Capillary: 120 mg/dL — ABNORMAL HIGH (ref 65–99)

## 2016-05-05 LAB — QUANTIFERON TB GOLD ASSAY (BLOOD)

## 2016-05-05 MED ORDER — HYDROXYZINE HCL 50 MG PO TABS
50.0000 mg | ORAL_TABLET | Freq: Three times a day (TID) | ORAL | Status: DC | PRN
  Administered 2016-05-05 – 2016-05-06 (×3): 50 mg via ORAL
  Filled 2016-05-05 (×3): qty 1

## 2016-05-05 NOTE — Progress Notes (Signed)
Patient presents with sad, flat affect. Isolates to self and room. Medication compliant. No group. Minimal interaction with staff and peers. Pt request Methadone.  Encouragement and support offered. Encouraged patient to attend group and verbalize feelings. Patient receptive and remains safe on unit with q 15 min checks.

## 2016-05-05 NOTE — Plan of Care (Signed)
Problem: Safety: Goal: Ability to remain free from injury will improve Outcome: Progressing Patient has remained free from injury during this shift.   

## 2016-05-05 NOTE — Progress Notes (Signed)
Recreation Therapy Notes  Date: 11.28.17 Time: 1:00 pm Location: Craft Room  Group Topic: Self-expression  Goal Area(s) Addresses:  Patient will identify one color per emotion listed on the wheel. Patient will verbalize one emotion experienced during session. Patient will be educated on other forms of self-expression  Behavioral Response: Did not attend  Intervention: Emotion Wheel  Activity: Patients were given an Emotion Wheel worksheet and were instructed to pick a color for each emotion listed on the wheel.  Education: LRT educated patients on different forms of self-expression.  Education Outcome: Patient did not attend group.  Clinical Observations/Feedback: Patient did not attend group.  Jacquelynn CreeGreene,Nicolus Ose M, LRT/CTRS 05/05/2016 4:00 PM

## 2016-05-05 NOTE — [facility] Group Notes (Signed)
[facility] Group Notes:  (Nursing/MHT/Case Management/Adjunct)  Date:  05/05/2016  Time:  1:50 PM  Type of Therapy:  Psychoeducational Skills  Participation Level:  Did Not Attend  Fred Reynolds Fred Reynolds 05/05/2016, 1:50 PM 

## 2016-05-05 NOTE — Progress Notes (Signed)
Recreation Therapy Notes  At approximately 1:27 pm, LRT attempted assessment. Patient stated his stomach hurt and he was having withdrawal symptoms and requested for LRT to come back to complete assessment tomorrow.  Jacquelynn CreeGreene,Perfecto Purdy M, LRT/CTRS 05/05/2016 2:40 PM

## 2016-05-05 NOTE — Plan of Care (Signed)
Problem: Coping: Goal: Ability to cope will improve Outcome: Not Progressing Pt reports feeling worse today
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
## 2016-05-05 NOTE — Progress Notes (Signed)
D: Patient appears flat and sad. States he doesn't understand why he can't go on Methadone. Denies SI/HI/AVH. Patient has been visible in the milieu and ate snack. C/o HA and tremors.  A: MD notified of HA. Ibuprofen ordered. Medication given with education. Encouragement provided.  R: Patient was compliant with medication. He has remained calm and cooperative. Safety maintained with 15 min checks

## 2016-05-06 LAB — CULTURE, BLOOD (ROUTINE X 2)
Culture: NO GROWTH
Culture: NO GROWTH

## 2016-05-06 LAB — VALPROIC ACID LEVEL: Valproic Acid Lvl: 25 ug/mL — ABNORMAL LOW (ref 50.0–100.0)

## 2016-05-06 MED ORDER — SULFAMETHOXAZOLE-TRIMETHOPRIM 800-160 MG PO TABS: 1.0000 | ORAL_TABLET | Freq: Two times a day (BID) | ORAL | 0 refills | Status: DC

## 2016-05-06 MED ORDER — DIVALPROEX SODIUM ER 250 MG PO TB24: 750.0000 mg | ORAL_TABLET | Freq: Every day | ORAL | 0 refills | Status: DC

## 2016-05-06 MED ORDER — LEVOFLOXACIN 750 MG PO TABS: 750.0000 mg | ORAL_TABLET | Freq: Every day | ORAL | 0 refills | Status: DC

## 2016-05-06 MED ORDER — CITALOPRAM HYDROBROMIDE 40 MG PO TABS: 40.0000 mg | ORAL_TABLET | Freq: Every day | ORAL | 0 refills | Status: DC

## 2016-05-06 MED ORDER — DILTIAZEM HCL ER COATED BEADS 120 MG PO CP24: 120.0000 mg | ORAL_CAPSULE | Freq: Every day | ORAL | 0 refills | Status: DC

## 2016-05-06 MED ORDER — GABAPENTIN 100 MG PO CAPS: 200.0000 mg | ORAL_CAPSULE | Freq: Three times a day (TID) | ORAL | 0 refills | Status: DC

## 2016-05-06 MED ORDER — QUETIAPINE FUMARATE 200 MG PO TABS: 200.0000 mg | ORAL_TABLET | Freq: Every day | ORAL | 0 refills | Status: DC

## 2016-05-06 NOTE — Plan of Care (Signed)
Problem: Coping: Goal: Ability to identify and develop effective coping behavior will improve Outcome: Not Progressing Patient feels he is detoxing off heroine with withdrawals. Instructed coping mechanisms. 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

## 2016-05-06 NOTE — Progress Notes (Signed)
D: Patient is alert and oriented on the unit this shift. Patient not attended and actively participated in groups today. Patient denies suicidal ideation, homicidal ideation, auditory or visual hallucinations at the present time.  A: Scheduled medications are administered to patient as per MD orders. Emotional support and encouragement are provided. Patient is maintained on q.15 minute safety checks. Patient is informed to notify staff with questions or concerns. R: No adverse medication reactions are noted. Patient is cooperative with medication administration   Patient is receptive,  Anxious and cooperative on the unit at this time. Patient does not interact  with others on the unit this shift. Patient contracts for safety at this time. Patient remains safe at this time.patient irritated that he had been on heroine and not given subloxone on the unit . Informed patient that it is illegal to give per Dr Toni Amendlapacs . Vistaril given 50 mg prn anxiety

## 2016-05-06 NOTE — Progress Notes (Signed)
Recreation Therapy Notes  Date: 11.29.17 Time: 1:00 pm Location: Craft Room  Group Topic: Self-esteem  Goal Area(s) Addresses:  Patient will write at least one positive trait about self. Patient will verbalize benefit of having healthy self-esteem.  Behavioral Response: Did not attend  Intervention: I Am  Activity: Patients were given a worksheet with the letter I on it and were instructed to write positive traits about themselves inside the letter.  Education: LRT educated patients on ways to increase their self-esteem.  Education Outcome: Patient did not attend group.  Clinical Observations/Feedback: Patient did not attend group.  Jacquelynn CreeGreene,Sevrin Sally M, LRT/CTRS 05/06/2016 2:20 PM

## 2016-05-06 NOTE — Tx Team (Signed)
Interdisciplinary Treatment and Diagnostic Plan Update  05/06/2016 Time of Session: 10:30 AM Fred Reynolds. MRN: [id number]  Principal Diagnosis: Major depressive disorder, recurrent episode, severe (HCC)  Secondary Diagnoses: Principal Problem:   Major depressive disorder, recurrent episode, severe (HCC) Active Problems:   PTSD (post-traumatic stress disorder)   Opioid type dependence, continuous (HCC)   Cannabis use disorder, severe, dependence (HCC)   Tobacco use disorder   Benzodiazepine abuse   Opioid use disorder, moderate, in sustained remission (HCC)   Cocaine use disorder, moderate, in sustained remission (HCC)   Hyperprolactinemia (HCC)   Obesity   Asthma   Community acquired pneumonia   Current Medications:  Current Facility-Administered Medications  Medication Dose Route Frequency Provider Last Rate Last Dose  . alum & mag hydroxide-simeth (MAALOX/MYLANTA) 200-200-20 MG/5ML suspension 30 mL  30 mL Oral Q4H PRN Darliss RidgelAarti K Kapur, MD      . citalopram (CELEXA) tablet 40 mg  40 mg Oral Daily Darliss RidgelAarti K Kapur, MD   40 mg at 05/06/16 0805  . diltiazem (CARDIZEM CD) 24 hr capsule 120 mg  120 mg Oral Daily Darliss RidgelAarti K Kapur, MD   120 mg at 05/06/16 0805  . divalproex (DEPAKOTE ER) 24 hr tablet 750 mg  750 mg Oral Daily Darliss RidgelAarti K Kapur, MD   750 mg at 05/06/16 0805  . gabapentin (NEURONTIN) capsule 200 mg  200 mg Oral TID Darliss RidgelAarti K Kapur, MD   200 mg at 05/06/16 0805  . hydrOXYzine (ATARAX/VISTARIL) tablet 50 mg  50 mg Oral Q8H PRN Jimmy FootmanAndrea Hernandez-Gonzalez, MD   50 mg at 05/06/16 0806  . ibuprofen (ADVIL,MOTRIN) tablet 800 mg  800 mg Oral Q6H PRN Shari ProwsJolanta B Pucilowska, MD   800 mg at 05/04/16 2224  . levofloxacin (LEVAQUIN) tablet 750 mg  750 mg Oral Daily Darliss RidgelAarti K Kapur, MD   750 mg at 05/05/16 1651  . magnesium hydroxide (MILK OF MAGNESIA) suspension 30 mL  30 mL Oral Daily PRN Darliss RidgelAarti K Kapur, MD      . nicotine (NICODERM CQ - dosed in mg/24 hours) patch 21 mg  21 mg Transdermal Daily  Jimmy FootmanAndrea Hernandez-Gonzalez, MD      . QUEtiapine (SEROQUEL) tablet 200 mg  200 mg Oral QHS Darliss RidgelAarti K Kapur, MD   200 mg at 05/05/16 2130  . sulfamethoxazole-trimethoprim (BACTRIM DS,SEPTRA DS) 800-160 MG per tablet 1 tablet  1 tablet Oral BID Darliss RidgelAarti K Kapur, MD   1 tablet at 05/06/16 0805   PTA Medications: Prescriptions Prior to Admission  Medication Sig Dispense Refill Last Dose  . citalopram (CELEXA) 40 MG tablet Take 1 tablet (40 mg total) by mouth daily. 30 tablet 0   . diltiazem (CARDIZEM CD) 120 MG 24 hr capsule Take 1 capsule (120 mg total) by mouth daily. 30 capsule 0   . divalproex (DEPAKOTE ER) 250 MG 24 hr tablet Take 3 tablets (750 mg total) by mouth daily. For mood stabilization 90 tablet 0 04/27/2016 at Unknown time  . gabapentin (NEURONTIN) 100 MG capsule Take 2 capsules (200 mg total) by mouth 3 (three) times daily. For agitation 180 capsule 0 04/27/2016 at Unknown time  . hydrOXYzine (ATARAX/VISTARIL) 50 MG tablet Take 1 tablet (50 mg total) by mouth every 8 (eight) hours as needed for anxiety. 20 tablet 0   . levofloxacin (LEVAQUIN) 750 MG tablet Take 1 tablet (750 mg total) by mouth daily. 10 tablet 0   . nicotine polacrilex (NICORETTE) 2 MG gum Take 1 each (2 mg total) by  mouth as needed for smoking cessation. (Patient not taking: Reported on 05/01/2016) 100 tablet 0 Not Taking at Unknown time  . QUEtiapine (SEROQUEL) 200 MG tablet Take 1 tablet (200 mg total) by mouth at bedtime. For mood control 30 tablet 0 04/27/2016 at Unknown time  . sulfamethoxazole-trimethoprim (BACTRIM DS,SEPTRA DS) 800-160 MG tablet Take 1 tablet by mouth 2 (two) times daily. 20 tablet 0     Patient Stressors: Financial difficulties Health problems Substance abuse  Patient Strengths: Active sense of humor Capable of independent living Motivation for treatment/growth  Treatment Modalities: Medication Management, Group therapy, Case management,  1 to 1 session with clinician, Psychoeducation,  Recreational therapy.   Physician Treatment Plan for Primary Diagnosis: Major depressive disorder, recurrent episode, severe (HCC) Long Term Goal(s): Improvement in symptoms so as ready for discharge Improvement in symptoms so as ready for discharge   Short Term Goals: Ability to identify changes in lifestyle to reduce recurrence of condition will improve Ability to verbalize feelings will improve Ability to disclose and discuss suicidal ideas Ability to demonstrate self-control will improve Ability to identify and develop effective coping behaviors will improve Compliance with prescribed medications will improve Ability to demonstrate self-control will improve Ability to identify and develop effective coping behaviors will improve Ability to identify triggers associated with substance abuse/mental health issues will improve  Medication Management: Evaluate patient's response, side effects, and tolerance of medication regimen.  Therapeutic Interventions: 1 to 1 sessions, Unit Group sessions and Medication administration.  Evaluation of Outcomes: Progressing  Physician Treatment Plan for Secondary Diagnosis: Principal Problem:   Major depressive disorder, recurrent episode, severe (HCC) Active Problems:   PTSD (post-traumatic stress disorder)   Opioid type dependence, continuous (HCC)   Cannabis use disorder, severe, dependence (HCC)   Tobacco use disorder   Benzodiazepine abuse   Opioid use disorder, moderate, in sustained remission (HCC)   Cocaine use disorder, moderate, in sustained remission (HCC)   Hyperprolactinemia (HCC)   Obesity   Asthma   Community acquired pneumonia  Long Term Goal(s): Improvement in symptoms so as ready for discharge Improvement in symptoms so as ready for discharge   Short Term Goals: Ability to identify changes in lifestyle to reduce recurrence of condition will improve Ability to verbalize feelings will improve Ability to disclose and discuss  suicidal ideas Ability to demonstrate self-control will improve Ability to identify and develop effective coping behaviors will improve Compliance with prescribed medications will improve Ability to demonstrate self-control will improve Ability to identify and develop effective coping behaviors will improve Ability to identify triggers associated with substance abuse/mental health issues will improve     Medication Management: Evaluate patient's response, side effects, and tolerance of medication regimen.  Therapeutic Interventions: 1 to 1 sessions, Unit Group sessions and Medication administration.  Evaluation of Outcomes: Progressing   RN Treatment Plan for Primary Diagnosis: Major depressive disorder, recurrent episode, severe (HCC) Long Term Goal(s): Knowledge of disease and therapeutic regimen to maintain health will improve  Short Term Goals: Ability to remain free from injury will improve, Ability to verbalize frustration and anger appropriately will improve, Ability to participate in decision making will improve, Ability to identify and develop effective coping behaviors will improve and Compliance with prescribed medications will improve  Medication Management: RN will administer medications as ordered by provider, will assess and evaluate patient's response and provide education to patient for prescribed medication. RN will report any adverse and/or side effects to prescribing provider.  Therapeutic Interventions: 1 on 1 counseling sessions,  Psychoeducation, Medication administration, Evaluate responses to treatment, Monitor vital signs and CBGs as ordered, Perform/monitor CIWA, COWS, AIMS and Fall Risk screenings as ordered, Perform wound care treatments as ordered.  Evaluation of Outcomes: Progressing   LCSW Treatment Plan for Primary Diagnosis: Major depressive disorder, recurrent episode, severe (HCC) Long Term Goal(s): Safe transition to appropriate next level of care at  discharge, Engage patient in therapeutic group addressing interpersonal concerns.  Short Term Goals: Engage patient in aftercare planning with referrals and resources, Increase social support, Facilitate acceptance of mental health diagnosis and concerns and Increase skills for wellness and recovery  Therapeutic Interventions: Assess for all discharge needs, 1 to 1 time with Social worker, Explore available resources and support systems, Assess for adequacy in community support network, Educate family and significant other(s) on suicide prevention, Complete Psychosocial Assessment, Interpersonal group therapy.  Evaluation of Outcomes: Progressing   Progress in Treatment: Attending groups: No. Participating in groups: No. Taking medication as prescribed: Yes. Toleration medication: Yes. Family/Significant other contact made: No, will contact:  Pt refused family contact. Patient understands diagnosis: Yes. Discussing patient identified problems/goals with staff: Yes. Medical problems stabilized or resolved: Yes. Denies suicidal/homicidal ideation: Yes. Issues/concerns per patient self-inventory: No.  New problem(s) identified: Yes, Describe:  Home stability.  New Short Term/Long Term Goal(s):  Discharge Plan or Barriers:   Reason for Continuation of Hospitalization: Depression Suicidal ideation Withdrawal symptoms  Estimated Length of Stay: D/C 05/06/2016  Attendees: Patient: Fred Reynolds 05/06/2016 11:12 AM  Physician: Dr. Radene JourneyAndrea Hernandez, MD  05/06/2016 11:12 AM  Nursing: Leonia ReaderPhyllis Cobb, BSN, RN 05/06/2016 11:12 AM  RN Care Manager: Philbert Riserori Terry, BSN, RN 05/06/2016 11:12 AM  Social Worker: Hampton AbbotKadijah Lanice Folden, MSW, LCSW-A 05/06/2016 11:12 AM  Recreational Therapist: Hershal CoriaBeth Greene, LRT, CTRS  05/06/2016 11:12 AM   Scribe for Treatment Team: Lynden OxfordKadijah R Pavel Gadd, LCSWA 05/06/2016 11:12 AM

## 2016-05-06 NOTE — [facility] Group Notes (Signed)
[facility] Group Notes:  (Nursing/MHT/Case Management/Adjunct)  Date:  05/06/2016  Time:  3:47 AM  Type of Therapy:  Group Therapy  Participation Level:  Minimal  Participation Quality:  Resistant  Affect:  Resistant  Cognitive:  Alert  Insight:  Limited  Engagement in Group:  Resistant  Modes of Intervention:  Discussion  Summary of Progress/Problems: Staff attempted to engage pt in conversation about his goal. Pt sat with arms folded, shaking his leg, and mumbling under his breath. Staff asked pt. Was he upset. He stated "Not right now, I'm just ready to go." pt would not elaborate any further.   Fanny Skatesshley Imani Nysa Sarin 05/06/2016, 3:47 AM
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
## 2016-05-06 NOTE — B[facility] Group Notes (Signed)
B[facility] LCSW Group Therapy  05/06/2016 11:32 AM  Type of Therapy:  Group Therapy  Participation Level:  Patient did not attend group. CSW invited patient to group.   Summary of Progress/Problems: Emotional Regulation: Patients will identify both negative and positive emotions. They will discuss emotions they have difficulty regulating and how they impact their lives. Patients will be asked to identify healthy coping skills to combat unhealthy reactions to negative emotions.    Jousha Schwandt G. Garnette CzechSampson MSW, LCSWA 05/06/2016, 11:32 AM

## 2016-05-06 NOTE — B[facility] Group Notes (Signed)
[facility] Group Notes:  (Nursing/MHT/Case Management/Adjunct)  Date:  05/06/2016  Time:  4:10 PM  Type of Therapy:  Group Therapy  Participation Level:  Did Not Attend   Elmer Merwin De'Chelle Saket Hellstrom 05/06/2016, 4:10 PM

## 2016-05-06 NOTE — Progress Notes (Signed)
D: Patient appears very frustrated and tearful. Denies SI/HI/AVH. He attended groups and went to snack. Patient states he wants to know who his doctor is from the medical floor so he can get started back on the Methadone. C/o anxiety. A: Medication given with education. Encouragement provided. MD notified of anxiety. Vistaril ordered. R: Patient was compliant with medication. He has remained calm and cooperative. Safety maintained with 15 min checks.

## 2016-05-06 NOTE — Progress Notes (Signed)
Patient continues to isolate to self and room. Refused noon gabapentin. Denies SI, HI, AVH. No negative behaviors. Prn given for anxiety with good relief. Denies SI, HI, AVH. Encouragement and support offered. Pt receptive and remains safe with q 15 min checks.

## 2016-05-06 NOTE — [facility] Suicide Risk Assessment (Signed)
[facility]   Principal Problem: Major depressive disorder, recurrent episode, severe (HCC) Discharge Diagnoses:  Patient Active Problem List   Diagnosis Date Noted  . Major depressive disorder, recurrent episode, severe (HCC) [F33.2] 05/03/2016  . Community acquired pneumonia [J18.9]   . Asthma [J45.909] 04/14/2016  . Obesity [E66.9] 07/15/2015  . Opioid use disorder, moderate, in sustained remission (HCC) [F11.21] 06/23/2015  . Cocaine use disorder, moderate, in sustained remission (HCC) [F14.21] 06/23/2015  . Hyperprolactinemia (HCC) [E22.1] 06/23/2015  . Benzodiazepine abuse [F13.10] 06/22/2015  . PTSD (post-traumatic stress disorder) [F43.10] 04/20/2015  . Opioid type dependence, continuous (HCC) [F11.20] 04/20/2015  . Cannabis use disorder, severe, dependence (HCC) [F12.20] 04/20/2015  . Tobacco use disorder [F17.200] 04/20/2015      Psychiatric Specialty Exam: ROS  Blood pressure 125/65, pulse (!) 50, temperature 97.7 F (36.5 C), resp. rate 18, height 5\' 3"  (1.6 m), weight 129.3 kg (285 lb), SpO2 95 %.Body mass index is 50.49 kg/m.                                                       Mental Status Per Nursing Assessment::   On Admission:     Demographic Factors:  Male, Low socioeconomic status and Unemployed  Loss Factors: Loss of significant relationship and Financial problems/change in socioeconomic status  Historical Factors: Prior suicide attempts and Impulsivity  Risk Reduction Factors:   Sense of responsibility to family  No access to guns  Continued Clinical Symptoms:  Alcohol/Substance Abuse/Dependencies More than one psychiatric diagnosis Previous Psychiatric Diagnoses and Treatments  Cognitive Features That Contribute To Risk:  Polarized thinking    Suicide Risk:  Minimal: No identifiable suicidal ideation.  Patients presenting with no risk factors but with morbid ruminations; may be  classified as minimal risk based on the severity of the depressive symptoms    Fred FootmanHernandez-Gonzalez,  [name], MD 05/06/2016, 10:33 AM

## 2016-05-07 LAB — RAPID HIV SCREEN (HIV 1/2 AB+AG)
HIV 1/2 ANTIBODIES: NONREACTIVE
HIV-1 P24 ANTIGEN - HIV24: NONREACTIVE

## 2016-05-07 MED ORDER — TUBERCULIN PPD 5 UNIT/0.1ML ID SOLN
5.0000 [IU] | Freq: Once | INTRADERMAL | Status: DC
  Administered 2016-05-07: 5 [IU] via INTRADERMAL
  Filled 2016-05-07: qty 0.1

## 2016-05-07 NOTE — Progress Notes (Signed)
TB skin test administered to L FA, see MAR. To be read 05/09/2016 1031.

## 2016-05-07 NOTE — Progress Notes (Signed)
  [facility] :  Will you be returning to the same living situation after discharge:  No. At discharge, do you have transportation home?: Yes,  mother will pick pt up. Do you have the ability to pay for your medications: No.  Release of information consent forms completed and in the chart;  Patient's signature needed at discharge.  Patient to Follow up at: Follow-up Information    [facility].   Why:  [facility] residential substance abuse treatment. This program is 65 days. If you have questions or concerns, please contact [facility] or [facility]. Contact information: Address: [address] [place], [place] [address] Ph: [phone] Fax: [phone]          Next level of care provider has access to [facility] Link:no  Safety Planning and Suicide Prevention discussed: Yes,  SPE completed with mother.  Have you used any form of tobacco in the last 30 days? (Cigarettes, Smokeless Tobacco, Cigars, and/or Pipes): Yes  Has patient been referred to the Quitline?: Patient refused referral  Patient has been referred for addiction treatment: Yes  Lynden OxfordKadijah R Octavius Shin, MSW, LCSW-A 05/07/2016, 11:56 AM
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
## 2016-05-07 NOTE — [facility] Group Notes (Signed)
[facility] LCSW Group Therapy  05/07/2016 10:36 AM  Type of Therapy:  Group Therapy  Participation Level:  Patient did not attend group. CSW invited patient to group.   Summary of Progress/Problems:Balance in life: Patients will discuss the concept of balance and how it looks and feels to be unbalanced. Pt will identify areas in their life that is unbalanced and ways to become more balanced. They discussed what aspects in their lives has influenced their self care. Patients also discussed self care in the areas of self regulation/control, hygiene/appearance, sleep/relaxation, healthy leisure, healthy eating habits, exercise, inner peace/spirituality, self improvement, sobriety, and health management. They were challenged to identify changes that are needed in order to improve self care.  Kerigan Narvaez G. Garnette CzechSampson MSW, LCSWA 05/07/2016, 10:37 AM

## 2016-05-07 NOTE — Progress Notes (Signed)
Provided and reviewed discharge paperwork and prescriptions. Verified understanding by use of teach back method. Verbalized understanding. 7 day supply of medications provided by pharmacy as ordered by doctor. Denies SI/HI/AVH. Pt belongings returned to pt as noted. Pt's mother to transport pt to rehab treatment facility. Safety maintained. Will continue to monitor.

## 2016-05-14 MED FILL — DILTIAZEM 24HR ER 120 MG CA: 120 | 30 days supply | Qty: 30 | Fill #0

## 2016-05-14 MED FILL — QUETIAPINE FUMARATE 200 MG: 200 | 30 days supply | Qty: 30 | Fill #0

## 2016-05-14 MED FILL — DIVALPROEX SOD ER 250 MG TA: 250 | 30 days supply | Qty: 90 | Fill #0

## 2016-05-14 MED FILL — ?CITALOPRAM HBR 40 MG TAB: 40 MG | 30 days supply | Qty: 30 | Fill #0

## 2016-05-14 MED FILL — GABAPENTIN 100 MG CAPSULE: 100 | 30 days supply | Qty: 180 | Fill #0

## 2016-06-15 LAB — ACID FAST CULTURE WITH REFLEXED SENSITIVITIES (MYCOBACTERIA)

## 2016-06-15 LAB — ACID FAST CULTURE WITH REFLEXED SENSITIVITIES
ACID FAST CULTURE - AFSCU3: NEGATIVE
ACID FAST CULTURE - AFSCU3: NEGATIVE

## 2016-06-17 ENCOUNTER — Telehealth: Payer: Self-pay | Admitting: Family Medicine

## 2016-06-17 NOTE — Telephone Encounter (Signed)
None of the medications he takes was prescribed by me; he has had only one visit with me in which he received Tylenol. Medications will be addressed once he is able to come in to see me after discharge.

## 2016-06-17 NOTE — Telephone Encounter (Signed)
Pt. Mother called stating that her son is in rehabilitation and he needs a refill on all his current medication. Please f/u

## 2016-06-18 ENCOUNTER — Telehealth: Payer: Self-pay

## 2016-06-18 NOTE — Telephone Encounter (Signed)
Writer called patient's mother back who had placed the call here asking for med refills.  She states understanding that MD will not refill without seeing patient however patient is in a "[facility] based rehab" and they won't let him out to get meds.  [place] in [place] in this rehab.  Patient's mother is afraid he will be "kicked out". Writer encouraged her to call for an appt when he does get out of the rehab.

## 2016-06-19 ENCOUNTER — Telehealth: Payer: Self-pay

## 2016-06-19 ENCOUNTER — Telehealth: Payer: Self-pay | Admitting: Family Medicine

## 2016-06-19 NOTE — Telephone Encounter (Signed)
Patient calling from a rehab facilty requesting a letter from Dr for rehab facility stating that patient was advised to go to ED to get a refill on current medications. Please fax letter to [phone]  [phone] to the attention of Dallie DadDennis Wilson as well as email to [email or web address]. States that he has seen [place] as well.

## 2016-06-19 NOTE — Telephone Encounter (Signed)
Writer spoke with patient's mother and suggested that she call [facility] or [facility] where patient has been seen.  Writer provided patient's mother with the telephone numbers of both facilities.

## 2016-06-19 NOTE — Telephone Encounter (Signed)
This is incorrect. Please refer to my previous documentation regarding this issue. Thank you

## 2016-06-19 NOTE — Telephone Encounter (Signed)
Pt reports there is no nurse or medical doctor on site at facility. He provided permission for staff to contact pt's mother, Susa GriffinsDeborah Moffat [phone] for further instruction on how to obtain requested med refills. LCSWA encouraged RN to contact Ms. Gaynell FaceMarshall and have her [facility] about pt's needs for med refills.

## 2016-06-24 ENCOUNTER — Ambulatory Visit: Payer: Self-pay | Admitting: Family Medicine

## 2016-06-25 ENCOUNTER — Emergency Department (HOSPITAL_COMMUNITY)
Admission: EM | Admit: 2016-06-25 | Discharge: 2016-06-26 | Disposition: A | Discharge status: Home / Self Care | Payer: No Typology Code available for payment source | Attending: Emergency Medicine | Admitting: Emergency Medicine

## 2016-06-25 ENCOUNTER — Emergency Department (HOSPITAL_COMMUNITY): Payer: No Typology Code available for payment source

## 2016-06-25 ENCOUNTER — Ambulatory Visit: Payer: Self-pay | Admitting: Family Medicine

## 2016-06-25 DIAGNOSIS — Z79899 Other long term (current) drug therapy: Secondary | ICD-10-CM | POA: Insufficient documentation

## 2016-06-25 DIAGNOSIS — J45909 Unspecified asthma, uncomplicated: Secondary | ICD-10-CM | POA: Insufficient documentation

## 2016-06-25 DIAGNOSIS — F1120 Opioid dependence, uncomplicated: Secondary | ICD-10-CM | POA: Diagnosis present

## 2016-06-25 DIAGNOSIS — F17210 Nicotine dependence, cigarettes, uncomplicated: Secondary | ICD-10-CM | POA: Insufficient documentation

## 2016-06-25 DIAGNOSIS — T402X4A Poisoning by other opioids, undetermined, initial encounter: Secondary | ICD-10-CM | POA: Insufficient documentation

## 2016-06-25 DIAGNOSIS — T40604A Poisoning by unspecified narcotics, undetermined, initial encounter: Secondary | ICD-10-CM

## 2016-06-25 LAB — CBC WITH DIFFERENTIAL/PLATELET
BASOS ABS: 0 10*3/uL (ref 0.0–0.1)
BASOS PCT: 0 %
EOS ABS: 0.1 10*3/uL (ref 0.0–0.7)
EOS PCT: 1 %
HCT: 41.9 % (ref 39.0–52.0)
Hemoglobin: 14.6 g/dL (ref 13.0–17.0)
Lymphocytes Relative: 10 %
Lymphs Abs: 1.3 10*3/uL (ref 0.7–4.0)
MCH: 32.1 pg (ref 26.0–34.0)
MCHC: 34.8 g/dL (ref 30.0–36.0)
MCV: 92.1 fL (ref 78.0–100.0)
Monocytes Absolute: 1 10*3/uL (ref 0.1–1.0)
Monocytes Relative: 7 %
Neutro Abs: 11.3 10*3/uL — ABNORMAL HIGH (ref 1.7–7.7)
Neutrophils Relative %: 82 %
PLATELETS: 208 10*3/uL (ref 150–400)
RBC: 4.55 MIL/uL (ref 4.22–5.81)
RDW: 14.5 % (ref 11.5–15.5)
WBC: 13.7 10*3/uL — AB (ref 4.0–10.5)

## 2016-06-25 LAB — COMPREHENSIVE METABOLIC PANEL
ALK PHOS: 68 U/L (ref 38–126)
ALT: 24 U/L (ref 17–63)
AST: 51 U/L — ABNORMAL HIGH (ref 15–41)
Albumin: 4.4 g/dL (ref 3.5–5.0)
Anion gap: 8 (ref 5–15)
BUN: 13 mg/dL (ref 6–20)
CALCIUM: 9 mg/dL (ref 8.9–10.3)
CO2: 30 mmol/L (ref 22–32)
CREATININE: 0.87 mg/dL (ref 0.61–1.24)
Chloride: 98 mmol/L — ABNORMAL LOW (ref 101–111)
Glucose, Bld: 117 mg/dL — ABNORMAL HIGH (ref 65–99)
Potassium: 4.5 mmol/L (ref 3.5–5.1)
Sodium: 136 mmol/L (ref 135–145)
TOTAL PROTEIN: 7.9 g/dL (ref 6.5–8.1)
Total Bilirubin: 0.6 mg/dL (ref 0.3–1.2)

## 2016-06-25 LAB — ETHANOL

## 2016-06-25 LAB — ACETAMINOPHEN LEVEL

## 2016-06-25 LAB — SALICYLATE LEVEL: Salicylate Lvl: <7 mg/dL (ref 2.8–30.0)

## 2016-06-25 MED ORDER — NALOXONE HCL 2 MG/2ML IJ SOSY
1.0000 mg | PREFILLED_SYRINGE | Freq: Once | INTRAMUSCULAR | Status: AC
  Administered 2016-06-25: 1 mg via INTRAVENOUS
  Filled 2016-06-25: qty 2

## 2016-06-25 MED ORDER — NALOXONE HCL 2 MG/2ML IJ SOSY
1.0000 mg/h | PREFILLED_SYRINGE | INTRAVENOUS | Status: DC
  Filled 2016-06-25: qty 4

## 2016-06-25 NOTE — ED Notes (Signed)
Pt states that he took 2 Roxicodones and his depakote He states that he wasn't trying to kill himself this time, he was arguing with his parents.

## 2016-06-25 NOTE — ED Triage Notes (Signed)
[name] reports that after an argument with his father he went out and bought 2 - 30 mg Oxycotin and used them intravenously. He received 2 mg narcan and was revived. On arrival he was questioned by the writer he denied feeling suicidal  And requested admission to the hospital because he has no where to sleep tonight. When informed that he may not be admitted to the hospital he states " I will fake being suicidal if that what I gotta do to get a room tonight".
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
## 2016-06-26 ENCOUNTER — Emergency Department (HOSPITAL_COMMUNITY)
Admission: EM | Admit: 2016-06-26 | Discharge: 2016-06-27 | Disposition: A | Discharge status: Other Acute Inpatient Hospital | Payer: Federal, State, Local not specified - Other | Attending: Emergency Medicine | Admitting: Emergency Medicine

## 2016-06-26 ENCOUNTER — Emergency Department (HOSPITAL_COMMUNITY): Payer: Self-pay

## 2016-06-26 DIAGNOSIS — J069 Acute upper respiratory infection, unspecified: Secondary | ICD-10-CM | POA: Insufficient documentation

## 2016-06-26 DIAGNOSIS — Z79899 Other long term (current) drug therapy: Secondary | ICD-10-CM | POA: Insufficient documentation

## 2016-06-26 DIAGNOSIS — F329 Major depressive disorder, single episode, unspecified: Secondary | ICD-10-CM | POA: Insufficient documentation

## 2016-06-26 DIAGNOSIS — F1199 Opioid use, unspecified with unspecified opioid-induced disorder: Secondary | ICD-10-CM | POA: Insufficient documentation

## 2016-06-26 DIAGNOSIS — J45909 Unspecified asthma, uncomplicated: Secondary | ICD-10-CM | POA: Insufficient documentation

## 2016-06-26 DIAGNOSIS — R45851 Suicidal ideations: Secondary | ICD-10-CM

## 2016-06-26 DIAGNOSIS — F332 Major depressive disorder, recurrent severe without psychotic features: Secondary | ICD-10-CM | POA: Diagnosis present

## 2016-06-26 DIAGNOSIS — F1120 Opioid dependence, uncomplicated: Secondary | ICD-10-CM | POA: Diagnosis present

## 2016-06-26 DIAGNOSIS — F17210 Nicotine dependence, cigarettes, uncomplicated: Secondary | ICD-10-CM | POA: Insufficient documentation

## 2016-06-26 LAB — URINALYSIS, ROUTINE W REFLEX MICROSCOPIC
Bacteria, UA: NONE SEEN
Bilirubin Urine: NEGATIVE
Glucose, UA: NEGATIVE mg/dL
Hgb urine dipstick: NEGATIVE
Ketones, ur: NEGATIVE mg/dL
LEUKOCYTES UA: NEGATIVE
Nitrite: NEGATIVE
PH: 6 (ref 5.0–8.0)
Protein, ur: 30 mg/dL — AB
SPECIFIC GRAVITY, URINE: 1.023 (ref 1.005–1.030)
SQUAMOUS EPITHELIAL / LPF: NONE SEEN

## 2016-06-26 LAB — RAPID URINE DRUG SCREEN, HOSP PERFORMED
Amphetamines: NOT DETECTED
BARBITURATES: NOT DETECTED
BENZODIAZEPINES: NOT DETECTED
COCAINE: NOT DETECTED
Opiates: POSITIVE — AB
TETRAHYDROCANNABINOL: NOT DETECTED

## 2016-06-26 LAB — RAPID STREP SCREEN (MED CTR MEBANE ONLY): Streptococcus, Group A Screen (Direct): NEGATIVE

## 2016-06-26 MED ORDER — DILTIAZEM HCL ER COATED BEADS 120 MG PO CP24
120.0000 mg | ORAL_CAPSULE | Freq: Every day | ORAL | Status: DC
  Administered 2016-06-27: 120 mg via ORAL
  Filled 2016-06-26: qty 1

## 2016-06-26 MED ORDER — QUETIAPINE FUMARATE 100 MG PO TABS
200.0000 mg | ORAL_TABLET | Freq: Every day | ORAL | Status: DC
  Administered 2016-06-26 – 2016-06-27 (×2): 200 mg via ORAL
  Filled 2016-06-26 (×2): qty 2

## 2016-06-26 MED ORDER — IBUPROFEN 200 MG PO TABS: 400.0000 mg | ORAL_TABLET | Freq: Three times a day (TID) | ORAL | Status: DC | PRN

## 2016-06-26 MED ORDER — IBUPROFEN 400 MG PO TABS: 400.0000 mg | ORAL_TABLET | Freq: Three times a day (TID) | ORAL | 0 refills | Status: DC | PRN

## 2016-06-26 MED ORDER — CITALOPRAM HYDROBROMIDE 10 MG PO TABS
40.0000 mg | ORAL_TABLET | Freq: Every day | ORAL | Status: DC
  Administered 2016-06-27: 40 mg via ORAL
  Filled 2016-06-26: qty 4

## 2016-06-26 MED ORDER — DIVALPROEX SODIUM ER 500 MG PO TB24
750.0000 mg | ORAL_TABLET | Freq: Every day | ORAL | Status: DC
  Administered 2016-06-27: 750 mg via ORAL
  Filled 2016-06-26: qty 1

## 2016-06-26 MED ORDER — IBUPROFEN 200 MG PO TABS
400.0000 mg | ORAL_TABLET | Freq: Three times a day (TID) | ORAL | Status: DC | PRN
Start: 2016-06-26 — End: 2016-06-27

## 2016-06-26 MED ORDER — GABAPENTIN 100 MG PO CAPS
200.0000 mg | ORAL_CAPSULE | Freq: Three times a day (TID) | ORAL | Status: DC
  Administered 2016-06-26 – 2016-06-27 (×4): 200 mg via ORAL
  Filled 2016-06-26 (×4): qty 2
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
## 2016-06-26 NOTE — Consult Note (Signed)
Patient was abusing oxycodone per IV and became unresponsive, revived with Narcan.  He denies suicidal ideations or overdose was intentional.  No homicidal ideations or hallucinations.  [name] had stated he needed a place to sleep and would say what he needed to say to get a bed.  However, he did not want to dress in the psychiatric scrubs or move from his medical room to a mental health bed.  [facility] but declined.  Stable for discharge.  Nanine MeansJamison Lord, PMH-NP

## 2016-06-26 NOTE — ED Notes (Addendum)
Pt states he does not want mental health admission. Notified Dave, TTS.  Pt spoke with NP.  Pt d/c and given bus pass.  Pt refusing to sign d/c signature.

## 2016-06-26 NOTE — ED Triage Notes (Signed)
Pt. Came in voluntary with complaint of SI, alert and oriented x4, pt. Stated that he has MD appt. On the 23rd and he stated "don't feel safe staying anywhere" while waiting for the said appt. Generalized pain also reported. Pt. Was seen here last night for OD .

## 2016-06-26 NOTE — BH Assessment (Signed)
Per Nira ConnJason Berry, FNP pt meets criteria for [facility] pending medical clearance. Spoke with Consuella LoseElaine, RN who reports the pt needs a chest x-ray due to wheezing on arrival. Consuella LoseElaine, RN advised the pt must be medically cleared and an EDP note should be entered providing medical clearance prior to the pt being assigned a bed number in OBS.   Princess BruinsAquicha Duff, MSW, Theresia MajorsLCSWA

## 2016-06-26 NOTE — ED Notes (Signed)
Pt sleeping quietly at this time with even, unlabored resp. Will continue to monitor 

## 2016-06-26 NOTE — BH Assessment (Addendum)
[facility] Assessment Progress Note  Per Nanine MeansJamison Lord, DNP, this pt does not require psychiatric hospitalization at this time.  [facility] referral information for area substance abuse treatment programs.  This has been included in pt's discharge instructions.  Pt's nurse, Johnny BridgeMartha, has been notified.  Doylene Canninghomas Maizie Garno, MA Triage Specialist [phone]   Addendum:  After speaking to Johnny BridgeMartha, finding that pt does not currently have housing, referral information for area homeless shelters has been added to pt's discharge instructions.  Doylene Canninghomas Tamalyn Wadsworth, MA Triage Specialist [phone]

## 2016-06-26 NOTE — ED Provider Notes (Signed)
Patient left at change of shift for observation after IV narcotic overdose requiring several doses of Narcan. He did not require any more Narcan after his original ED physician left. Patient was seen around 6 AM. He states he feels are right. Patient states that he does abuse off the street narcotics. He states he did go to [facility] and was in a methadone clinic however he had no insurance and had stopped. He states he's supposed to go to rehabilitation on January 23 however his family will not take him back into their house until then.  Patient is awake and alert in no distress.  TTS consult ordered.  Devoria AlbeIva Willer Osorno, MD, Concha PyoFACEP    Brodie Correll, MD 06/26/16 [phone]
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
## 2016-06-26 NOTE — Progress Notes (Addendum)
Review of pt chart indicates he was sent [facility] [place] [facility]   [facility] on [facility].  Why:  [facility] for your residential substance abuse treatment. This program is 65 days. If you have questions or concerns, please contact [facility] or [facility] ForeJohn Ramos. Contact information: Address: [address] [place], [place] [address] Ph: [phone] Fax: [phone]

## 2016-06-26 NOTE — ED Notes (Signed)
Unable to reach TTS for screening

## 2016-06-26 NOTE — BH Assessment (Signed)
[facility] Assessment Progress Note  Case was staffed with Cresenciano GenreLord DNP, Akintayo MD who recommended patient be monitored in the Observation Unit. Patient declined admission and is requesting to be discharged this date and denies any thoughts of self harm. Patient also declines any information for OP resources. Lord DNP assessed patient and patient will be discharged this date.

## 2016-06-26 NOTE — Discharge Instructions (Addendum)
To help you maintain a sober lifestyle, a substance abuse treatment program may be beneficial to you.  Contact one of the following facilities at your earliest opportunity to ask about enrolling:  RESIDENTIAL PROGRAMS:       [facility]      [address]      [place], [place] [address]      [phone]       [facility]      [address]      [place], [place] [address]      [phone]       [facility]      [address]      [place], [place] [address]      [phone]  OUTPATIENT PROGRAMS:       [facility]      [address]      [place], [place] [address]      [phone]      New patients are seen at the walk-in clinic every Tuesday from 9:00 am - 12:00 pm.   For your shelter needs, contact the following service providers:       [facility]      [address]      [place], [place] [address]      [phone]       [facility]      [address]      [place], [place] [address]      [phone]  For day shelter and other supportive services for the homeless, contact the [facility]):       [facility]      [address]      [place], [place] [address]      [phone]  For transitional housing, contact one of the following agencies.  They provide longer term housing than a shelter, but there is an application process:       [facility]      [address]      [place], [place] [address]      [phone]       [facility]      [facility]      [address]      [place], [place] [address]      [phone]

## 2016-06-27 ENCOUNTER — Inpatient Hospital Stay (HOSPITAL_COMMUNITY)
Admission: AD | Admit: 2016-06-27 | Discharge: 2016-06-30 | DRG: 885 | Disposition: A | Discharge status: Home / Self Care | Payer: Federal, State, Local not specified - Other | Source: Intra-hospital | Attending: Psychiatry | Admitting: Psychiatry

## 2016-06-27 ENCOUNTER — Encounter (HOSPITAL_COMMUNITY): Payer: Self-pay | Admitting: Emergency Medicine

## 2016-06-27 ENCOUNTER — Observation Stay (HOSPITAL_COMMUNITY)
Admission: AD | Admit: 2016-06-27 | Payer: No Typology Code available for payment source | Source: Intra-hospital | Admitting: Psychiatry

## 2016-06-27 DIAGNOSIS — E669 Obesity, unspecified: Secondary | ICD-10-CM | POA: Diagnosis present

## 2016-06-27 DIAGNOSIS — M109 Gout, unspecified: Secondary | ICD-10-CM | POA: Diagnosis present

## 2016-06-27 DIAGNOSIS — F332 Major depressive disorder, recurrent severe without psychotic features: Principal | ICD-10-CM | POA: Diagnosis present

## 2016-06-27 DIAGNOSIS — E221 Hyperprolactinemia: Secondary | ICD-10-CM | POA: Diagnosis present

## 2016-06-27 DIAGNOSIS — Z811 Family history of alcohol abuse and dependence: Secondary | ICD-10-CM

## 2016-06-27 DIAGNOSIS — J45909 Unspecified asthma, uncomplicated: Secondary | ICD-10-CM

## 2016-06-27 DIAGNOSIS — Z6841 Body Mass Index (BMI) 40.0 and over, adult: Secondary | ICD-10-CM | POA: Diagnosis not present

## 2016-06-27 DIAGNOSIS — F1120 Opioid dependence, uncomplicated: Secondary | ICD-10-CM

## 2016-06-27 DIAGNOSIS — Z91030 Bee allergy status: Secondary | ICD-10-CM

## 2016-06-27 DIAGNOSIS — G4700 Insomnia, unspecified: Secondary | ICD-10-CM | POA: Diagnosis present

## 2016-06-27 DIAGNOSIS — Z560 Unemployment, unspecified: Secondary | ICD-10-CM | POA: Diagnosis not present

## 2016-06-27 DIAGNOSIS — F1421 Cocaine dependence, in remission: Secondary | ICD-10-CM | POA: Diagnosis present

## 2016-06-27 DIAGNOSIS — F4310 Post-traumatic stress disorder, unspecified: Secondary | ICD-10-CM | POA: Diagnosis present

## 2016-06-27 DIAGNOSIS — F1121 Opioid dependence, in remission: Secondary | ICD-10-CM | POA: Diagnosis present

## 2016-06-27 DIAGNOSIS — Z62819 Personal history of unspecified abuse in childhood: Secondary | ICD-10-CM | POA: Diagnosis present

## 2016-06-27 DIAGNOSIS — F17210 Nicotine dependence, cigarettes, uncomplicated: Secondary | ICD-10-CM

## 2016-06-27 DIAGNOSIS — Z9114 Patient's other noncompliance with medication regimen: Secondary | ICD-10-CM

## 2016-06-27 DIAGNOSIS — F410 Panic disorder [episodic paroxysmal anxiety] without agoraphobia: Secondary | ICD-10-CM | POA: Diagnosis present

## 2016-06-27 DIAGNOSIS — Z888 Allergy status to other drugs, medicaments and biological substances status: Secondary | ICD-10-CM

## 2016-06-27 DIAGNOSIS — Z885 Allergy status to narcotic agent status: Secondary | ICD-10-CM | POA: Diagnosis not present

## 2016-06-27 DIAGNOSIS — Z8614 Personal history of Methicillin resistant Staphylococcus aureus infection: Secondary | ICD-10-CM

## 2016-06-27 DIAGNOSIS — Z9119 Patient's noncompliance with other medical treatment and regimen: Secondary | ICD-10-CM | POA: Diagnosis not present

## 2016-06-27 DIAGNOSIS — R45851 Suicidal ideations: Secondary | ICD-10-CM | POA: Diagnosis present

## 2016-06-27 DIAGNOSIS — Z8781 Personal history of (healed) traumatic fracture: Secondary | ICD-10-CM | POA: Diagnosis not present

## 2016-06-27 DIAGNOSIS — Z981 Arthrodesis status: Secondary | ICD-10-CM | POA: Diagnosis not present

## 2016-06-27 DIAGNOSIS — Z653 Problems related to other legal circumstances: Secondary | ICD-10-CM

## 2016-06-27 DIAGNOSIS — F322 Major depressive disorder, single episode, severe without psychotic features: Secondary | ICD-10-CM | POA: Diagnosis present

## 2016-06-27 DIAGNOSIS — Z915 Personal history of self-harm: Secondary | ICD-10-CM

## 2016-06-27 DIAGNOSIS — Z818 Family history of other mental and behavioral disorders: Secondary | ICD-10-CM

## 2016-06-27 DIAGNOSIS — I10 Essential (primary) hypertension: Secondary | ICD-10-CM | POA: Diagnosis present

## 2016-06-27 DIAGNOSIS — Z9889 Other specified postprocedural states: Secondary | ICD-10-CM | POA: Diagnosis not present

## 2016-06-27 DIAGNOSIS — Z79899 Other long term (current) drug therapy: Secondary | ICD-10-CM

## 2016-06-27 MED ORDER — AEROCHAMBER Z-STAT PLUS/MEDIUM MISC
1.0000 | Freq: Once | Status: DC
  Filled 2016-06-27: qty 1

## 2016-06-27 MED ORDER — ALBUTEROL SULFATE HFA 108 (90 BASE) MCG/ACT IN AERS
1.0000 | INHALATION_SPRAY | RESPIRATORY_TRACT | Status: DC | PRN
  Administered 2016-06-27: 2 via RESPIRATORY_TRACT
  Filled 2016-06-27: qty 6.7

## 2016-06-27 NOTE — ED Notes (Signed)
Pt continues to rest comfortably in bed with no complaints voiced.

## 2016-06-27 NOTE — ED Provider Notes (Signed)
[facility] Dr[facility] Jama Flavorsobos.  The pt has been c/o cough for the past 2 days.  I reviewed his CXR which did not show PNA.  Pt does have some wheezing, but is saturating well.  The pt said he's supposed to be on an inhaler, so I ordered albuterol MDI with spacer.  [facility].   Jacalyn LefevreJulie Adline Kirshenbaum, MD 06/27/16 2038
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
## 2016-06-27 NOTE — ED Notes (Signed)
SBAR Report received from previous nurse. Pt received calm and visible on unit. Pt denies current SI/ HI, A/V H, depression, anxiety, or pain at this time, and appears otherwise stable and free of distress. Pt reminded of camera surveillance, q 15 min rounds, and rules of the milieu. Will continue to assess. 
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
## 2016-06-27 NOTE — BH Assessment (Signed)
Per Nira ConnJason Berry, FNP due to pt's presenting symptoms, pt is now recommended to receive an AM psych eval. Spoke with Berenda MoraleElaine H Reagan, RN and Preston FleetingGlick, MD who agree with the updated disposition.  Princess BruinsAquicha Duff, MSW, Theresia MajorsLCSWA

## 2016-06-27 NOTE — [facility] Counselor (Signed)
Per Chyrl CivatteJoAnn, [facility] bed placement in observational unit at-bed 3. If pt unwilling to come to obs unit voluntarily, he will not be able to come to OBS unit.  Pt can be IVC by EDP but will need to wait for appropriate placement in another facility. [facility].   Orlie PollenAshley Margarete Horace, LPC, NCC,  LCAS-A Therapeutic Triage Specialist  06/27/2016 3:56 AM

## 2016-06-27 NOTE — ED Notes (Signed)
Pt's behavior continues to be calm and cooperative. He stays in bed most of the time resting. Denies SI/HI, AVH at this time. Does not appear to be responding to internal stimuli.

## 2016-06-27 NOTE — ED Notes (Signed)
Pt now crying & agitated stating "I don't want a male sitter.  I don't want to be by myself.  I may as just as well go home and if I do, I'll kill myself.  I was just here last night 'cause I OD'd."  Pt informed he will be going to Obs if cleared medically.  Pt then stated "I don't want to be alone.  If I go there I will be alone.  I wasn't alone when I was upstairs."   Attempted to explain to pt, IP's have sitters.  Pt continued to be argumentative stating "I can't be alone, that's why I came here.  Why can't you all understand."  Dr. Anitra LauthPlunkett informed of pt's comment.  Dr. Anitra LauthPlunkett requested pt be informed if he attempts to leave he will be IVC'd.  Male sitter also changed to accommodate pt to have a male sitter.    Pt informed of Dr. Loralee PacasPlunkett's message.  Pt then became agitated and stated I did not say that.  3 sitters all confirmed pt did make the statement.  Pt continues to be tearful and now demanding to speak to Dr. Anitra LauthPlunkett again.  Dr. Anitra LauthPlunkett informed of pt's request.

## 2016-06-28 ENCOUNTER — Encounter (HOSPITAL_COMMUNITY): Payer: Self-pay

## 2016-06-28 DIAGNOSIS — Z91030 Bee allergy status: Secondary | ICD-10-CM

## 2016-06-28 DIAGNOSIS — Z888 Allergy status to other drugs, medicaments and biological substances status: Secondary | ICD-10-CM

## 2016-06-28 DIAGNOSIS — Z811 Family history of alcohol abuse and dependence: Secondary | ICD-10-CM

## 2016-06-28 DIAGNOSIS — Z79899 Other long term (current) drug therapy: Secondary | ICD-10-CM

## 2016-06-28 DIAGNOSIS — F322 Major depressive disorder, single episode, severe without psychotic features: Secondary | ICD-10-CM

## 2016-06-28 MED ORDER — MAGNESIUM HYDROXIDE 400 MG/5ML PO SUSP: 30.0000 mL | Freq: Every day | ORAL | Status: DC | PRN

## 2016-06-28 MED ORDER — NICOTINE POLACRILEX 2 MG MT GUM
CHEWING_GUM | OROMUCOSAL | Status: AC
  Administered 2016-06-28: 2 mg
  Filled 2016-06-28: qty 1

## 2016-06-28 MED ORDER — DILTIAZEM HCL ER COATED BEADS 120 MG PO CP24
120.0000 mg | ORAL_CAPSULE | Freq: Every day | ORAL | Status: DC
  Administered 2016-06-28 – 2016-06-29 (×2): 120 mg via ORAL
  Filled 2016-06-28 (×2): qty 1
  Filled 2016-06-28: qty 7
  Filled 2016-06-28 (×2): qty 1

## 2016-06-28 MED ORDER — DIVALPROEX SODIUM ER 250 MG PO TB24
750.0000 mg | ORAL_TABLET | Freq: Every day | ORAL | Status: DC
  Administered 2016-06-28 – 2016-06-29 (×2): 750 mg via ORAL
  Filled 2016-06-28 (×5): qty 1

## 2016-06-28 MED ORDER — QUETIAPINE FUMARATE 200 MG PO TABS
200.0000 mg | ORAL_TABLET | Freq: Every day | ORAL | Status: DC
  Administered 2016-06-28: 200 mg via ORAL
  Filled 2016-06-28 (×4): qty 1

## 2016-06-28 MED ORDER — LOPERAMIDE HCL 2 MG PO CAPS: 2.0000 mg | ORAL_CAPSULE | ORAL | Status: DC | PRN

## 2016-06-28 MED ORDER — GABAPENTIN 100 MG PO CAPS
200.0000 mg | ORAL_CAPSULE | Freq: Three times a day (TID) | ORAL | Status: DC
  Administered 2016-06-28 – 2016-06-29 (×5): 200 mg via ORAL
  Filled 2016-06-28: qty 2
  Filled 2016-06-28: qty 42
  Filled 2016-06-28 (×4): qty 2
  Filled 2016-06-28: qty 42
  Filled 2016-06-28 (×3): qty 2
  Filled 2016-06-28: qty 42
  Filled 2016-06-28 (×2): qty 2

## 2016-06-28 MED ORDER — NICOTINE 21 MG/24HR TD PT24
21.0000 mg | MEDICATED_PATCH | Freq: Every day | TRANSDERMAL | Status: DC
  Filled 2016-06-28 (×3): qty 1

## 2016-06-28 MED ORDER — ALUM & MAG HYDROXIDE-SIMETH 200-200-20 MG/5ML PO SUSP: 30.0000 mL | ORAL | Status: DC | PRN

## 2016-06-28 MED ORDER — IBUPROFEN 400 MG PO TABS
400.0000 mg | ORAL_TABLET | Freq: Three times a day (TID) | ORAL | Status: DC | PRN
Start: 2016-06-28 — End: 2016-06-30
  Administered 2016-06-28: 400 mg via ORAL
  Filled 2016-06-28: qty 1

## 2016-06-28 MED ORDER — ONDANSETRON 4 MG PO TBDP: 4.0000 mg | ORAL_TABLET | Freq: Four times a day (QID) | ORAL | Status: DC | PRN

## 2016-06-28 MED ORDER — HYDROXYZINE HCL 25 MG PO TABS
25.0000 mg | ORAL_TABLET | Freq: Four times a day (QID) | ORAL | Status: DC | PRN
  Administered 2016-06-28 – 2016-06-29 (×5): 25 mg via ORAL
  Filled 2016-06-28 (×3): qty 1
  Filled 2016-06-28: qty 10
  Filled 2016-06-28: qty 1

## 2016-06-28 MED ORDER — CITALOPRAM HYDROBROMIDE 40 MG PO TABS
40.0000 mg | ORAL_TABLET | Freq: Every day | ORAL | Status: DC
  Administered 2016-06-28 – 2016-06-29 (×2): 40 mg via ORAL
  Filled 2016-06-28: qty 7
  Filled 2016-06-28 (×4): qty 1

## 2016-06-28 MED ORDER — QUETIAPINE FUMARATE 25 MG PO TABS: 25.0000 mg | ORAL_TABLET | Freq: Two times a day (BID) | ORAL | Status: DC | PRN

## 2016-06-28 MED ORDER — DICYCLOMINE HCL 20 MG PO TABS: 20.0000 mg | ORAL_TABLET | Freq: Four times a day (QID) | ORAL | Status: DC | PRN

## 2016-06-28 MED ORDER — NICOTINE POLACRILEX 2 MG MT GUM
2.0000 mg | CHEWING_GUM | OROMUCOSAL | Status: DC | PRN
  Administered 2016-06-29: 2 mg via ORAL
  Filled 2016-06-28: qty 1

## 2016-06-28 MED ORDER — ALBUTEROL SULFATE HFA 108 (90 BASE) MCG/ACT IN AERS
1.0000 | INHALATION_SPRAY | RESPIRATORY_TRACT | Status: DC | PRN
  Administered 2016-06-28: 2 via RESPIRATORY_TRACT
  Filled 2016-06-28: qty 6.7

## 2016-06-28 MED ORDER — METHOCARBAMOL 500 MG PO TABS
500.0000 mg | ORAL_TABLET | Freq: Three times a day (TID) | ORAL | Status: DC | PRN
  Administered 2016-06-28: 500 mg via ORAL
  Filled 2016-06-28: qty 1

## 2016-06-28 NOTE — [facility] Group Notes (Signed)
[facility] Group Notes: (Clinical Social Work)   06/28/2016      Type of Therapy:  Group Therapy   Participation Level:  Did Not Attend despite MHT prompting   Ambrose MantleMareida Grossman-Orr, LCSW 06/28/2016, 6:09 PM

## 2016-06-28 NOTE — [facility] Group Notes (Signed)
[facility] Group Notes:  (Nursing/MHT/Case Management/Adjunct)  Date:  06/28/2016  Time:  0930 am  Type of Therapy:  Psychoeducational Skills  Participation Level:  Did Not Attend  Patient invited; declined to attend.  Cranford MonBeaudry, Trek Kimball Evans 06/28/2016, 11:25 AM

## 2016-06-28 NOTE — Progress Notes (Signed)
Data. Patient denies SI/HI/AVH. Patient isolated in his room most of shift, just getting up for meals. He took his medications late, even with multiple requests. Affcet is blunt and his mood is irritable. He reports, "I think I'm getting sick". On his self assessment patient reports 5/10 for anxiety and did not complete depression and hopelessness. His goal for today is: "Sleep". Action. Emotional support and encouragement offered. Education provided on medication, indications and side effect. Q 15 minute checks done for safety. Response. Safety on the unit maintained through 15 minute checks.   Remained calm and appropriate through out shift.

## 2016-06-28 NOTE — [facility] Suicide Risk Assessment (Signed)
[facility] Family/Significant Other Suicide Prevention Education  Suicide Prevention Education:   Patient Refusal for Family/Significant Other Suicide Prevention Education: The patient has refused to provide written consent for family/significant other to be provided Family/Significant Other Suicide Prevention Education during admission and/or prior to discharge.  Physician notified.  CSW provided suicide prevention information with patient.    The suicide prevention education provided includes the following:  Suicide risk factors  Suicide prevention and interventions  National Suicide Hotline telephone number  [facility] assessment telephone number  [facility] Emergency Assistance 911  [facility] and/or Residential Mobile Crisis Unit telephone number   Fred MallingChelsea Raylin Reynolds, [place] 06/28/2016 1:08 PM
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
## 2016-06-28 NOTE — Tx Team (Signed)
Initial Treatment Plan 06/28/2016 2:30 AM Fred AxonJohn W Ransome Jr. LKG:[id number]    PATIENT STRESSORS: Financial difficulties Marital or family conflict Medication change or noncompliance Substance abuse   PATIENT STRENGTHS: Capable of independent living Wellsite geologistCommunication skills General fund of knowledge Motivation for treatment/growth   PATIENT IDENTIFIED PROBLEMS: Depression  Suicidal ideation  Substance abuse  "Try to get out of my head"  "Bet my meds straight"             DISCHARGE CRITERIA:  Improved stabilization in mood, thinking, and/or behavior Verbal commitment to aftercare and medication compliance Withdrawal symptoms are absent or subacute and managed without 24-hour nursing intervention  PRELIMINARY DISCHARGE PLAN: Outpatient therapy Medication management  PATIENT/FAMILY INVOLVEMENT: This treatment plan has been presented to and reviewed with the patient, Fred AxonJohn W Town Jr.  The patient and family have been given the opportunity to ask questions and make suggestions.  Levin BaconHeather V Venesha Petraitis, RN 06/28/2016, 2:30 AM

## 2016-06-28 NOTE — BHH Suicide Risk Assessment (Signed)
[facility] Admission Suicide Risk Assessment   [facility]  Patient Demographic factors:  Low socioeconomic status, Unemployed Current Mental Status:  NA Loss Factors:  Legal issues, Financial problems / change in socioeconomic status Historical Factors:  Prior suicide attempts, Family history of mental illness or substance abuse Risk Reduction Factors:  Sense of responsibility to family, Positive therapeutic relationship, Positive social support, Positive coping skills or problem solving skills  Total Time spent with patient: 20 minutes Principal Problem: Severe major depression (HCC) Diagnosis:   Patient Active Problem List   Diagnosis Date Noted  . Severe major depression (HCC) [F32.2] 06/28/2016  . Suicidal ideation [R45.851]   . Major depressive disorder, recurrent episode, severe (HCC) [F33.2] 05/03/2016  . Community acquired pneumonia [J18.9]   . Asthma [J45.909] 04/14/2016  . Obesity [E66.9] 07/15/2015  . Opioid use disorder, moderate, in sustained remission (HCC) [F11.21] 06/23/2015  . Cocaine use disorder, moderate, in sustained remission (HCC) [F14.21] 06/23/2015  . Hyperprolactinemia (HCC) [E22.1] 06/23/2015  . Benzodiazepine abuse [F13.10] 06/22/2015  . PTSD (post-traumatic stress disorder) [F43.10] 04/20/2015  . Opioid type dependence, continuous (HCC) [F11.20] 04/20/2015  . Cannabis use disorder, severe, dependence (HCC) [F12.20] 04/20/2015  . Tobacco use disorder [F17.200] 04/20/2015   Subjective Data:  Fred AxonJohn W Hickam Jr. is a 31 year old male with opioid use disorder, alcohol use disorder, bipolar disorder per chart, presented to ED for SI with plan to walk out of the front of a car . He had narcotic overdose the day before presenting to ED.   Patient states that he was feeling depressed as he ran out of medication. He states that he had difficulty making an appointment with his doctor and had been running out of medication for 1-2 weeks. He also  reports that he felt stupid when he talked with others who are dismissive. He used opioid "pills" although he was sober since November last year. He wants to get back on his medication and gets "okay." He feels depressed, low energy but denies SI. He reports mild tremors (not significant on exam). He denies AH/VH.   Continued Clinical Symptoms:  Alcohol Use Disorder Identification Test Final Score (AUDIT): 0 The "Alcohol Use Disorders Identification Test", Guidelines for Use in Primary Care, Second Edition.  World Science writerHealth Organization [facility](WHO). Score between 0-7:  no or low risk or alcohol related problems. Score between 8-15:  moderate risk of alcohol related problems. Score between 16-19:  high risk of alcohol related problems. Score 20 or above:  warrants further diagnostic evaluation for alcohol dependence and treatment.   CLINICAL FACTORS:   Depression:   Hopelessness   Musculoskeletal: Strength & Muscle Tone: within normal limits Gait & Station: normal Patient leans: N/A  Psychiatric Specialty Exam: Physical Exam  Nursing note and vitals reviewed. Constitutional: He appears well-developed and well-nourished.  Neurological:  No tremors    Review of Systems  Psychiatric/Behavioral: Positive for depression and substance abuse. Negative for hallucinations and suicidal ideas. The patient is nervous/anxious and has insomnia.   All other systems reviewed and are negative.   Blood pressure 120/74, pulse 93, temperature 98.5 F (36.9 C), temperature source Oral, resp. rate 18, height 5\' 3"  (1.6 m), weight 297 lb (134.7 kg), SpO2 97 %.Body mass index is 52.61 kg/m.  General Appearance: Casual  Eye Contact:  Minimal  Speech:  Clear and Coherent  Volume:  Normal  Mood:  Depressed  Affect:  Constricted  Thought Process:  Coherent and Goal Directed  Orientation:  Full (  Time, Place, and Person)  Thought Content:  Logical Perceptions: denies AH/VH  Suicidal Thoughts:  No  Homicidal  Thoughts:  No  Memory:  Immediate;   Good Recent;   Good Remote;   Good  Judgement:  Impaired  Insight:  Shallow  Psychomotor Activity:  Normal  Concentration:  Concentration: Good and Attention Span: Good  Recall:  Good  Fund of Knowledge:  Good  Language:  Good  Akathisia:  No  Handed:  Right  AIMS (if indicated):     Assets:  Communication Skills Desire for Improvement  ADL's:  Intact  Cognition:  WNL  Sleep:  Number of Hours: 5.5     COGNITIVE FEATURES THAT CONTRIBUTE TO RISK:  Closed-mindedness    SUICIDE RISK:   Mild:  Suicidal ideation of limited frequency, intensity, duration, and specificity.  There are no identifiable plans, no associated intent, mild dysphoria and related symptoms, good self-control (both objective and subjective assessment), few other risk factors, and identifiable protective factors, including available and accessible social support.  PLAN OF CARE: Patient will be admitted to inpatient psychiatric unit for stabilization and safety. Will provide and encourage milieu participation. Provide medication management and maked adjustments as needed.  Will follow daily.   I certify that inpatient services furnished can reasonably be expected to improve the patient's condition.   Neysa Hotter, MD 06/28/2016, 10:40 AM

## 2016-06-28 NOTE — Progress Notes (Signed)
D    Pt is loud and demanding at times and appears to try to dominate the dayroom    He said he plans for discharge tomorrow or the next day    He reports wanting some long term treatment and asked if we could put him on methadone  A    Verbal support given   Medications administered and effectiveness monitored    Q 15 min checks     Explained that we dont typically give methadone and he should go to a methadone clinic R   Pt is safe at present time
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
## 2016-06-29 DIAGNOSIS — F17210 Nicotine dependence, cigarettes, uncomplicated: Secondary | ICD-10-CM

## 2016-06-29 DIAGNOSIS — Z9889 Other specified postprocedural states: Secondary | ICD-10-CM

## 2016-06-29 LAB — CULTURE, GROUP A STREP (THRC)

## 2016-06-29 MED ORDER — TRAZODONE HCL 50 MG PO TABS: 50.0000 mg | ORAL_TABLET | Freq: Every evening | ORAL | Status: DC | PRN

## 2016-06-29 MED ORDER — MIRTAZAPINE 15 MG PO TABS
15.0000 mg | ORAL_TABLET | Freq: Every evening | ORAL | Status: DC | PRN
  Administered 2016-06-29: 15 mg via ORAL
  Filled 2016-06-29 (×2): qty 1
  Filled 2016-06-29 (×2): qty 14
  Filled 2016-06-29 (×4): qty 1

## 2016-06-29 MED ORDER — QUETIAPINE FUMARATE 100 MG PO TABS
100.0000 mg | ORAL_TABLET | Freq: Every day | ORAL | Status: DC
  Administered 2016-06-29: 100 mg via ORAL
  Filled 2016-06-29 (×2): qty 1
  Filled 2016-06-29: qty 7
  Filled 2016-06-29: qty 1

## 2016-06-29 NOTE — [facility] Group Notes (Signed)
[facility] LCSW Group Therapy  06/29/2016 1:15pm  Type of Therapy:  Group Therapy vercoming Obstacles  Pt did not attend, declined invitation.   Leanord Thibeau, LCSW 06/29/2016 2:35 PM  

## 2016-06-29 NOTE — Progress Notes (Signed)
Patient ID: Fred AxonJohn W Pascucci Jr., male   DOB: 09-25-1985, 31 y.o.   MRN: [id number]  Patient stated he would not take medication without getting his clothes. Patient's clothes were returned to patient from the laundry room. Patient did not get up after several prompts and even after saying he would. Writer will administer medication when patient is willing to take medication.
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
## 2016-06-29 NOTE — Progress Notes (Signed)
Patient ID: Fred AxonJohn W Waibel Jr., male   DOB: 16-Oct-1985, 31 y.o.   MRN: [id number]  DAR: Pt. Denies SI/HI and A/V Hallucinations. He reports he is not having a good day today. He is guarded and irritable during interaction. He is flat in affect and depressed in mood. He reports sleep is fair, appetite is fair, energy level is normal, and concentration is good. He rates depression, hopelessness, and anxiety 5/10. He reports sedation, cravings, and irritability as part of his withdrawal symptoms. Patient does not report any pain at this time. Support and encouragement provided to the patient to come to writer if he needs anything however patient is minimal. Scheduled medications administered to patient per physician's orders but late as patient would not get out of bed this morning. MD Cobos was notified this morning about this. Q15 minute checks are maintained for safety.

## 2016-06-29 NOTE — Progress Notes (Signed)
Recreation Therapy Notes  Date: 06/29/16 Time: 0930 Location: 300 Hall Group Room  Group Topic: Stress Management  Goal Area(s) Addresses:  Patient will verbalize importance of using healthy stress management.  Patient will identify positive emotions associated with healthy stress management.   Intervention: Guided Imagery  Activity :  Peaceful Place.  LRT introduced the stress management technique of guided imagery.  LRT read a script that allowed patients to take a mental journey to their favorite place.  Patients were to listen and follow along as the LRT read the script.  Education:  Stress Management, Discharge Planning.   Education Outcome: Acknowledges edcuation/In group clarification offered/Needs additional education  Clinical Observations/Feedback: Pt did not attend group.   Caroll RancherMarjette Salvatore Poe, LRT/CTRS         Caroll RancherLindsay, Zyeir Dymek A 06/29/2016 11:57 AM

## 2016-06-29 NOTE — Tx Team (Signed)
Interdisciplinary Treatment and Diagnostic Plan Update  06/29/2016 Time of Session: 11:45 AM  Fred Reynolds. MRN: [id number]  Principal Diagnosis: Severe major depression ([place])  Secondary Diagnoses: Principal Problem:   Severe major depression ([place])   Current Medications:  Current Facility-Administered Medications  Medication Dose Route Frequency Provider Last Rate Last Dose  . albuterol (PROVENTIL HFA;VENTOLIN HFA) 108 (90 Base) MCG/ACT inhaler 1-2 puff  1-2 puff Inhalation Q4H PRN Rozetta Nunnery, NP   2 puff at 06/28/16 2127  . alum & mag hydroxide-simeth (MAALOX/MYLANTA) 200-200-20 MG/5ML suspension 30 mL  30 mL Oral Q4H PRN Rozetta Nunnery, NP      . citalopram (CELEXA) tablet 40 mg  40 mg Oral Daily Rozetta Nunnery, NP   40 mg at 06/29/16 1014  . dicyclomine (BENTYL) tablet 20 mg  20 mg Oral Q6H PRN Rozetta Nunnery, NP      . diltiazem (CARDIZEM CD) 24 hr capsule 120 mg  120 mg Oral Daily Rozetta Nunnery, NP   120 mg at 06/29/16 1014  . divalproex (DEPAKOTE ER) 24 hr tablet 750 mg  750 mg Oral Daily Rozetta Nunnery, NP   750 mg at 06/29/16 1013  . gabapentin (NEURONTIN) capsule 200 mg  200 mg Oral TID Rozetta Nunnery, NP   200 mg at 06/28/16 2126  . hydrOXYzine (ATARAX/VISTARIL) tablet 25 mg  25 mg Oral Q6H PRN Rozetta Nunnery, NP   25 mg at 06/29/16 6734  . ibuprofen (ADVIL,MOTRIN) tablet 400 mg  400 mg Oral Q8H PRN Rozetta Nunnery, NP   400 mg at 06/28/16 1433  . loperamide (IMODIUM) capsule 2-4 mg  2-4 mg Oral PRN Rozetta Nunnery, NP      . magnesium hydroxide (MILK OF MAGNESIA) suspension 30 mL  30 mL Oral Daily PRN Rozetta Nunnery, NP      . methocarbamol (ROBAXIN) tablet 500 mg  500 mg Oral Q8H PRN Rozetta Nunnery, NP   500 mg at 06/28/16 2126  . nicotine polacrilex (NICORETTE) gum 2 mg  2 mg Oral Q2H PRN Rozetta Nunnery, NP      . ondansetron (ZOFRAN-ODT) disintegrating tablet 4 mg  4 mg Oral Q6H PRN Rozetta Nunnery, NP      . QUEtiapine (SEROQUEL) tablet 200 mg  200 mg Oral QHS Rozetta Nunnery, NP    200 mg at 06/28/16 2125  . QUEtiapine (SEROQUEL) tablet 25 mg  25 mg Oral BID PRN Kerrie Buffalo, NP        PTA Medications: Prescriptions Prior to Admission  Medication Sig Dispense Refill Last Dose  . citalopram (CELEXA) 40 MG tablet Take 1 tablet (40 mg total) by mouth daily. 30 tablet 0 a week  . diltiazem (CARDIZEM CD) 120 MG 24 hr capsule Take 1 capsule (120 mg total) by mouth daily. 30 capsule 0 a week  . divalproex (DEPAKOTE ER) 250 MG 24 hr tablet Take 3 tablets (750 mg total) by mouth daily. 90 tablet 0 a week  . gabapentin (NEURONTIN) 100 MG capsule Take 2 capsules (200 mg total) by mouth 3 (three) times daily. 120 capsule 0 a week  . ibuprofen (ADVIL,MOTRIN) 400 MG tablet Take 1 tablet (400 mg total) by mouth every 8 (eight) hours as needed (Temp > 38.3Celsius). 30 tablet 0 a week  . QUEtiapine (SEROQUEL) 200 MG tablet Take 1 tablet (200 mg total) by mouth at bedtime. 30 tablet 0 a week at Unknown time  . [  DISCONTINUED] levofloxacin (LEVAQUIN) 750 MG tablet Take 1 tablet (750 mg total) by mouth daily. (Patient not taking: Reported on 06/25/2016) 6 tablet 0 Not Taking at Unknown time  . [DISCONTINUED] sulfamethoxazole-trimethoprim (BACTRIM DS,SEPTRA DS) 800-160 MG tablet Take 1 tablet by mouth 2 (two) times daily. (Patient not taking: Reported on 06/25/2016) 12 tablet 0 Not Taking at Unknown time    Treatment Modalities: Medication Management, Group therapy, Case management,  1 to 1 session with clinician, Psychoeducation, Recreational therapy.  Patient Stressors: Financial difficulties Marital or family conflict Medication change or noncompliance Substance abuse  Patient Strengths: Capable of independent living Curator fund of knowledge Motivation for treatment/growth  Physician Treatment Plan for Primary Diagnosis: Severe major depression ([place]) Long Term Goal(s): Improvement in symptoms so as ready for discharge  Short Term Goals: Ability to disclose  and discuss suicidal ideas Ability to identify and develop effective coping behaviors will improve Compliance with prescribed medications will improve Ability to identify changes in lifestyle to reduce recurrence of condition will improve Ability to verbalize feelings will improve Ability to disclose and discuss suicidal ideas Ability to demonstrate self-control will improve Ability to identify and develop effective coping behaviors will improve Compliance with prescribed medications will improve  Medication Management: Evaluate patient's response, side effects, and tolerance of medication regimen.  Therapeutic Interventions: 1 to 1 sessions, Unit Group sessions and Medication administration.  Evaluation of Outcomes: Not Met  Physician Treatment Plan for Secondary Diagnosis: Principal Problem:   Severe major depression ([place])   Long Term Goal(s): Improvement in symptoms so as ready for discharge  Short Term Goals: Ability to disclose and discuss suicidal ideas Ability to identify and develop effective coping behaviors will improve Compliance with prescribed medications will improve Ability to identify changes in lifestyle to reduce recurrence of condition will improve Ability to verbalize feelings will improve Ability to disclose and discuss suicidal ideas Ability to demonstrate self-control will improve Ability to identify and develop effective coping behaviors will improve Compliance with prescribed medications will improve  Medication Management: Evaluate patient's response, side effects, and tolerance of medication regimen.  Therapeutic Interventions: 1 to 1 sessions, Unit Group sessions and Medication administration.  Evaluation of Outcomes: Not Met   RN Treatment Plan for Primary Diagnosis: Severe major depression ([place]) Long Term Goal(s): Knowledge of disease and therapeutic regimen to maintain health will improve  Short Term Goals: Ability to verbalize feelings will  improve, Ability to disclose and discuss suicidal ideas and Ability to identify and develop effective coping behaviors will improve  Medication Management: RN will administer medications as ordered by provider, will assess and evaluate patient's response and provide education to patient for prescribed medication. RN will report any adverse and/or side effects to prescribing provider.  Therapeutic Interventions: 1 on 1 counseling sessions, Psychoeducation, Medication administration, Evaluate responses to treatment, Monitor vital signs and CBGs as ordered, Perform/monitor CIWA, COWS, AIMS and Fall Risk screenings as ordered, Perform wound care treatments as ordered.  Evaluation of Outcomes: Not Met   LCSW Treatment Plan for Primary Diagnosis: Severe major depression ([place]) Long Term Goal(s): Safe transition to appropriate next level of care at discharge, Engage patient in therapeutic group addressing interpersonal concerns.  Short Term Goals: Engage patient in aftercare planning with referrals and resources, Identify triggers associated with mental health/substance abuse issues and Increase skills for wellness and recovery  Therapeutic Interventions: Assess for all discharge needs, 1 to 1 time with Social worker, Explore available resources and support systems, Assess for adequacy in  community support network, Educate family and significant other(s) on suicide prevention, Complete Psychosocial Assessment, Interpersonal group therapy.  Evaluation of Outcomes: Not Met   Progress in Treatment: noAttending groups: No Participating in groups:No Taking medication as prescribed: Yes, MD continues to assess for medication changes as needed Toleration medication: Yes, no side effects reported at this time Family/Significant other contact made: No, Pt declines Patient understands diagnosis: Continuing to assess Discussing patient identified problems/goals with staff: Yes Medical problems stabilized or  resolved: Yes Denies suicidal/homicidal ideation: Yes Issues/concerns per patient self-inventory: None Other: N/A  New problem(s) identified: None identified at this time.   New Short Term/Long Term Goal(s): None identified at this time.   Discharge Plan or Barriers: Pt plans to [facility] to [facility] for continued substance abuse treatment  Reason for Continuation of Hospitalization: Anxiety Depression Medication stabilization  Estimated Length of Stay: 1-2 days  Attendees: Patient: 06/29/2016  11:45 AM  Physician: Dr. Parke Poisson 06/29/2016  11:45 AM  Nursing: Gaylan Gerold, RN; Darrol Angel, RN 06/29/2016  11:45 AM  RN Care Manager: Lars Pinks, RN 06/29/2016  11:45 AM  Social Worker: Adriana Reams, LCSW; Erasmo Downer Drinkard, LCSW 06/29/2016  11:45 AM  Recreational Therapist:  06/29/2016  11:45 AM  Other: Lindell Spar, NP; Ricky Ala, NP 06/29/2016  11:45 AM  Other:  06/29/2016  11:45 AM  Other: 06/29/2016  11:45 AM    Scribe for Treatment Team: Gladstone Lighter, LCSW 06/29/2016 11:45 AM

## 2016-06-29 NOTE — [facility] Group Notes (Signed)
[facility] LCSW Group Therapy  06/29/2016 1:15pm  Type of Therapy:  Group Therapy vercoming Obstacles  Pt did not attend, declined invitation.   [place], LCSW 06/29/2016 2:35 PM  

## 2016-06-30 ENCOUNTER — Ambulatory Visit: Payer: Self-pay | Attending: Family Medicine | Admitting: Family Medicine

## 2016-06-30 ENCOUNTER — Encounter: Payer: Self-pay | Admitting: Family Medicine

## 2016-06-30 VITALS — BP 119/72 | HR 90 | Temp 98.3°F | Ht 63.0 in | Wt 301.2 lb

## 2016-06-30 DIAGNOSIS — Z6841 Body Mass Index (BMI) 40.0 and over, adult: Secondary | ICD-10-CM | POA: Insufficient documentation

## 2016-06-30 DIAGNOSIS — I10 Essential (primary) hypertension: Secondary | ICD-10-CM

## 2016-06-30 DIAGNOSIS — F319 Bipolar disorder, unspecified: Secondary | ICD-10-CM

## 2016-06-30 DIAGNOSIS — Z79899 Other long term (current) drug therapy: Secondary | ICD-10-CM | POA: Insufficient documentation

## 2016-06-30 DIAGNOSIS — J4520 Mild intermittent asthma, uncomplicated: Secondary | ICD-10-CM

## 2016-06-30 DIAGNOSIS — Z981 Arthrodesis status: Secondary | ICD-10-CM | POA: Insufficient documentation

## 2016-06-30 DIAGNOSIS — M792 Neuralgia and neuritis, unspecified: Secondary | ICD-10-CM

## 2016-06-30 DIAGNOSIS — IMO0001 Reserved for inherently not codable concepts without codable children: Secondary | ICD-10-CM

## 2016-06-30 DIAGNOSIS — F1120 Opioid dependence, uncomplicated: Secondary | ICD-10-CM | POA: Insufficient documentation

## 2016-06-30 DIAGNOSIS — E6609 Other obesity due to excess calories: Secondary | ICD-10-CM

## 2016-06-30 DIAGNOSIS — Z888 Allergy status to other drugs, medicaments and biological substances status: Secondary | ICD-10-CM | POA: Insufficient documentation

## 2016-06-30 DIAGNOSIS — G5791 Unspecified mononeuropathy of right lower limb: Secondary | ICD-10-CM

## 2016-06-30 DIAGNOSIS — G629 Polyneuropathy, unspecified: Secondary | ICD-10-CM | POA: Insufficient documentation

## 2016-06-30 LAB — LIPID PANEL
CHOL/HDL RATIO: 3.1 ratio
CHOLESTEROL: 179 mg/dL (ref 0–200)
HDL: 58 mg/dL (ref >40)
LDL Cholesterol: 99 mg/dL (ref 0–99)
Triglycerides: 112 mg/dL (ref <150)
VLDL: 22 mg/dL (ref 0–40)

## 2016-06-30 MED ORDER — MELOXICAM 7.5 MG PO TABS: 7.5000 mg | ORAL_TABLET | Freq: Every day | ORAL | 1 refills | Status: AC

## 2016-06-30 MED ORDER — MIRTAZAPINE 15 MG PO TABS
15.0000 mg | ORAL_TABLET | Freq: Every day | ORAL | Status: DC
  Filled 2016-06-30: qty 7

## 2016-06-30 MED ORDER — HYDROXYZINE HCL 25 MG PO TABS: ORAL_TABLET | ORAL | 0 refills | Status: AC

## 2016-06-30 MED ORDER — QUETIAPINE FUMARATE 100 MG PO TABS: 100.0000 mg | ORAL_TABLET | Freq: Every day | ORAL | 0 refills | Status: AC

## 2016-06-30 MED ORDER — DILTIAZEM HCL ER COATED BEADS 120 MG PO CP24: 120.0000 mg | ORAL_CAPSULE | Freq: Every day | ORAL | 0 refills | Status: DC

## 2016-06-30 MED ORDER — DIVALPROEX SODIUM ER 250 MG PO TB24
750.0000 mg | ORAL_TABLET | Freq: Every day | ORAL | Status: DC
  Filled 2016-06-30: qty 21

## 2016-06-30 MED ORDER — CITALOPRAM HYDROBROMIDE 40 MG PO TABS: 40.0000 mg | ORAL_TABLET | Freq: Every day | ORAL | 0 refills | Status: AC

## 2016-06-30 MED ORDER — DILTIAZEM HCL ER COATED BEADS 120 MG PO CP24: 120.0000 mg | ORAL_CAPSULE | Freq: Every day | ORAL | 3 refills | Status: DC

## 2016-06-30 MED ORDER — GABAPENTIN 100 MG PO CAPS: 200.0000 mg | ORAL_CAPSULE | Freq: Three times a day (TID) | ORAL | 0 refills | Status: DC

## 2016-06-30 MED ORDER — GABAPENTIN 300 MG PO CAPS: 300.0000 mg | ORAL_CAPSULE | Freq: Three times a day (TID) | ORAL | 3 refills | Status: DC

## 2016-06-30 MED ORDER — ALBUTEROL SULFATE HFA 108 (90 BASE) MCG/ACT IN AERS: 1.0000 | INHALATION_SPRAY | RESPIRATORY_TRACT | Status: DC | PRN

## 2016-06-30 MED ORDER — DIVALPROEX SODIUM ER 250 MG PO TB24: 750.0000 mg | ORAL_TABLET | Freq: Every day | ORAL | 0 refills | Status: AC

## 2016-06-30 MED ORDER — NICOTINE POLACRILEX 2 MG MT GUM: 2.0000 mg | CHEWING_GUM | OROMUCOSAL | 0 refills | Status: AC | PRN

## 2016-06-30 MED ORDER — MIRTAZAPINE 15 MG PO TABS: ORAL_TABLET | ORAL | 0 refills | Status: AC

## 2016-06-30 MED FILL — ?QUETIAPINE FUMARATE 100 MG: 100 MG | 30 days supply | Qty: 30 | Fill #0

## 2016-06-30 MED FILL — DILTIAZEM 24HR ER 120 MG CA: 120 | 30 days supply | Qty: 30 | Fill #0

## 2016-06-30 MED FILL — DIVALPROEX SOD ER 250 MG TA: 250 | 30 days supply | Qty: 90 | Fill #0

## 2016-06-30 MED FILL — hydrOXYzine HCL 25 MG TABS: 25 | 15 days supply | Qty: 60 | Fill #0

## 2016-06-30 MED FILL — ?GABAPENTIN 100 MG CAP: 30 days supply | Qty: 180 | Fill #0

## 2016-06-30 MED FILL — MIRTAZAPINE 15 MG TABLET: 15 | 30 days supply | Qty: 30 | Fill #0

## 2016-06-30 MED FILL — CITALOPRAM HBR 40 MG TABLET: 40 | 30 days supply | Qty: 30 | Fill #0

## 2016-06-30 MED FILL — MELOXICAM 7.5 MG TABLET: 7.5 | 30 days supply | Qty: 30 | Fill #0

## 2016-06-30 NOTE — Tx Team (Signed)
Interdisciplinary Treatment and Diagnostic Plan Update  06/30/2016 Time of Session: 11:19 AM  Fred Reynolds. MRN: [id number]  Principal Diagnosis: Severe major depression (HCC)  Secondary Diagnoses: Principal Problem:   Severe major depression (HCC)   Current Medications:  Current Facility-Administered Medications  Medication Dose Route Frequency Provider Last Rate Last Dose  . albuterol (PROVENTIL HFA;VENTOLIN HFA) 108 (90 Base) MCG/ACT inhaler 1-2 puff  1-2 puff Inhalation Q4H PRN Jackelyn Poling, NP   2 puff at 06/28/16 2127  . alum & mag hydroxide-simeth (MAALOX/MYLANTA) 200-200-20 MG/5ML suspension 30 mL  30 mL Oral Q4H PRN Jackelyn Poling, NP      . citalopram (CELEXA) tablet 40 mg  40 mg Oral Daily Jackelyn Poling, NP   40 mg at 06/29/16 1014  . dicyclomine (BENTYL) tablet 20 mg  20 mg Oral Q6H PRN Jackelyn Poling, NP      . diltiazem (CARDIZEM CD) 24 hr capsule 120 mg  120 mg Oral Daily Jackelyn Poling, NP   120 mg at 06/29/16 1014  . divalproex (DEPAKOTE ER) 24 hr tablet 750 mg  750 mg Oral Daily Jackelyn Poling, NP   750 mg at 06/29/16 1013  . gabapentin (NEURONTIN) capsule 200 mg  200 mg Oral TID Jackelyn Poling, NP   200 mg at 06/29/16 1712  . hydrOXYzine (ATARAX/VISTARIL) tablet 25 mg  25 mg Oral Q6H PRN Jackelyn Poling, NP   25 mg at 06/29/16 1825  . ibuprofen (ADVIL,MOTRIN) tablet 400 mg  400 mg Oral Q8H PRN Jackelyn Poling, NP   400 mg at 06/28/16 1433  . loperamide (IMODIUM) capsule 2-4 mg  2-4 mg Oral PRN Jackelyn Poling, NP      . magnesium hydroxide (MILK OF MAGNESIA) suspension 30 mL  30 mL Oral Daily PRN Jackelyn Poling, NP      . methocarbamol (ROBAXIN) tablet 500 mg  500 mg Oral Q8H PRN Jackelyn Poling, NP   500 mg at 06/28/16 2126  . mirtazapine (REMERON) tablet 15 mg  15 mg Oral QHS,MR X 1 Kerry Hough, PA-C   15 mg at 06/29/16 2127  . nicotine polacrilex (NICORETTE) gum 2 mg  2 mg Oral Q2H PRN Jackelyn Poling, NP   2 mg at 06/29/16 1713  . ondansetron (ZOFRAN-ODT) disintegrating  tablet 4 mg  4 mg Oral Q6H PRN Jackelyn Poling, NP      . QUEtiapine (SEROQUEL) tablet 100 mg  100 mg Oral QHS Craige Cotta, MD   100 mg at 06/29/16 2128  . QUEtiapine (SEROQUEL) tablet 25 mg  25 mg Oral BID PRN Adonis Brook, NP        PTA Medications: Prescriptions Prior to Admission  Medication Sig Dispense Refill Last Dose  . citalopram (CELEXA) 40 MG tablet Take 1 tablet (40 mg total) by mouth daily. 30 tablet 0 a week  . diltiazem (CARDIZEM CD) 120 MG 24 hr capsule Take 1 capsule (120 mg total) by mouth daily. 30 capsule 0 a week  . divalproex (DEPAKOTE ER) 250 MG 24 hr tablet Take 3 tablets (750 mg total) by mouth daily. 90 tablet 0 a week  . gabapentin (NEURONTIN) 100 MG capsule Take 2 capsules (200 mg total) by mouth 3 (three) times daily. 120 capsule 0 a week  . ibuprofen (ADVIL,MOTRIN) 400 MG tablet Take 1 tablet (400 mg total) by mouth every 8 (eight) hours as needed (Temp > 38.3Celsius). 30 tablet 0 a  week  . QUEtiapine (SEROQUEL) 200 MG tablet Take 1 tablet (200 mg total) by mouth at bedtime. 30 tablet 0 a week at Unknown time  . [DISCONTINUED] levofloxacin (LEVAQUIN) 750 MG tablet Take 1 tablet (750 mg total) by mouth daily. (Patient not taking: Reported on 06/25/2016) 6 tablet 0 Not Taking at Unknown time  . [DISCONTINUED] sulfamethoxazole-trimethoprim (BACTRIM DS,SEPTRA DS) 800-160 MG tablet Take 1 tablet by mouth 2 (two) times daily. (Patient not taking: Reported on 06/25/2016) 12 tablet 0 Not Taking at Unknown time    Treatment Modalities: Medication Management, Group therapy, Case management,  1 to 1 session with clinician, Psychoeducation, Recreational therapy.  Patient Stressors: Financial difficulties Marital or family conflict Medication change or noncompliance Substance abuse  Patient Strengths: Capable of independent living Wellsite geologist fund of knowledge Motivation for treatment/growth  Physician Treatment Plan for Primary Diagnosis: Severe  major depression (HCC) Long Term Goal(s): Improvement in symptoms so as ready for discharge  Short Term Goals: Ability to disclose and discuss suicidal ideas Ability to identify and develop effective coping behaviors will improve Compliance with prescribed medications will improve Ability to identify changes in lifestyle to reduce recurrence of condition will improve Ability to verbalize feelings will improve Ability to disclose and discuss suicidal ideas Ability to demonstrate self-control will improve Ability to identify and develop effective coping behaviors will improve Compliance with prescribed medications will improve  Medication Management: Evaluate patient's response, side effects, and tolerance of medication regimen.  Therapeutic Interventions: 1 to 1 sessions, Unit Group sessions and Medication administration.  Evaluation of Outcomes: Adequate for Discharge  Physician Treatment Plan for Secondary Diagnosis: Principal Problem:   Severe major depression (HCC)   Long Term Goal(s): Improvement in symptoms so as ready for discharge  Short Term Goals: Ability to disclose and discuss suicidal ideas Ability to identify and develop effective coping behaviors will improve Compliance with prescribed medications will improve Ability to identify changes in lifestyle to reduce recurrence of condition will improve Ability to verbalize feelings will improve Ability to disclose and discuss suicidal ideas Ability to demonstrate self-control will improve Ability to identify and develop effective coping behaviors will improve Compliance with prescribed medications will improve  Medication Management: Evaluate patient's response, side effects, and tolerance of medication regimen.  Therapeutic Interventions: 1 to 1 sessions, Unit Group sessions and Medication administration.  Evaluation of Outcomes: Adequate for Discharge   RN Treatment Plan for Primary Diagnosis: Severe major  depression (HCC) Long Term Goal(s): Knowledge of disease and therapeutic regimen to maintain health will improve  Short Term Goals: Ability to verbalize feelings will improve, Ability to disclose and discuss suicidal ideas and Ability to identify and develop effective coping behaviors will improve  Medication Management: RN will administer medications as ordered by provider, will assess and evaluate patient's response and provide education to patient for prescribed medication. RN will report any adverse and/or side effects to prescribing provider.  Therapeutic Interventions: 1 on 1 counseling sessions, Psychoeducation, Medication administration, Evaluate responses to treatment, Monitor vital signs and CBGs as ordered, Perform/monitor CIWA, COWS, AIMS and Fall Risk screenings as ordered, Perform wound care treatments as ordered.  Evaluation of Outcomes: Adequate for Discharge   LCSW Treatment Plan for Primary Diagnosis: Severe major depression (HCC) Long Term Goal(s): Safe transition to appropriate next level of care at discharge, Engage patient in therapeutic group addressing interpersonal concerns.  Short Term Goals: Engage patient in aftercare planning with referrals and resources, Identify triggers associated with mental health/substance abuse issues and  Increase skills for wellness and recovery  Therapeutic Interventions: Assess for all discharge needs, 1 to 1 time with Social worker, Explore available resources and support systems, Assess for adequacy in community support network, Educate family and significant other(s) on suicide prevention, Complete Psychosocial Assessment, Interpersonal group therapy.  Evaluation of Outcomes: Adequate for Discharge   Progress in Treatment: Attending groups: No Participating in groups:No Taking medication as prescribed: Yes, MD continues to assess for medication changes as needed Toleration medication: Yes, no side effects reported at this  time Family/Significant other contact made: No, Pt declines Patient understands diagnosis: Continuing to assess Discussing patient identified problems/goals with staff: Yes Medical problems stabilized or resolved: Yes Denies suicidal/homicidal ideation: Yes Issues/concerns per patient self-inventory: None Other: N/A  New problem(s) identified: None identified at this time.   New Short Term/Long Term Goal(s): None identified at this time.   Discharge Plan or Barriers: [facility]  Reason for Continuation of Hospitalization: None identified at this time.   Estimated Length of Stay: 0 days  Attendees: Patient: 06/30/2016  11:19 AM  Physician: Dr. Jama Flavorsobos 06/30/2016  11:19 AM  Nursing: Celso Sicklearolina Beaudry, RN; Liborio NixonPatrice White, RN 06/30/2016  11:19 AM  RN Care Manager: Onnie BoerJennifer Clark, RN 06/30/2016  11:19 AM  Social Worker: Vernie ShanksLauren Dwanda Tufano, LCSW; Heather Smart, LCSW 06/30/2016  11:19 AM  Recreational Therapist:  06/30/2016  11:19 AM  Other: Armandina StammerAgnes Nwoko, NP; Gray BernhardtMay Augustin, NP 06/30/2016  11:19 AM  Other:  06/30/2016  11:19 AM  Other: 06/30/2016  11:19 AM    Scribe for Treatment Team: Verdene LennertLauren C Keonia Pasko, LCSW 06/30/2016 11:19 AM

## 2016-06-30 NOTE — [facility] Group Notes (Signed)
[facility] Group Notes:  (Nursing/MHT/Case Management/Adjunct)  Date:  06/30/2016  Time:  0900 am  Type of Therapy:  Mindfulness/Living in the Moment  Participation Level:  Did Not Attend  Summary of Progress/Problems:  Fred Reynolds, Fred Reynolds 06/30/2016, 10:19 AM

## 2016-06-30 NOTE — Progress Notes (Signed)
D: Pt denies SI/HI/AVH. Pt is pleasant and cooperative most of the evening. Pt appeared upset about the Seroquel taper, but pt was ok. Pt forwarded little information but was appropriate this evening.  A: Pt was offered support and encouragement. Pt was given scheduled medications. Pt was encourage to attend groups. Q 15 minute checks were done for safety.    R:Pt attends groups and interacts well with peers and staff. Pt is taking medication. Pt has no complaints.Pt receptive to treatment and safety maintained on unit.

## 2016-06-30 NOTE — Progress Notes (Signed)
D: Patient is irritable stating, "I'm waiting to get out of here."  He has been in bed all morning, refusing groups or medication.  Patient refused to fill out self inventory.  He is scheduled for discharge today. A: Initiate discharge paperwork and review with patient.  Review follow up appointment and medications. R: Patient is isolative and withdrawn.

## 2016-06-30 NOTE — Progress Notes (Signed)
Discharge note:  Patient discharged home per MD order.  [facility].  [facility].  Patient denies any thoughts of self harm.  Patient received prescriptions and medication samples.  Patient left ambulatory with a bus pass.

## 2016-06-30 NOTE — [facility] Suicide Risk Assessment (Signed)
[facility]   Principal Problem: Severe major depression [facility](HCC) Discharge Diagnoses:  Patient Active Problem List   Diagnosis Date Noted  . Severe major depression (HCC) [F32.2] 06/28/2016  . Suicidal ideation [R45.851]   . Major depressive disorder, recurrent episode, severe (HCC) [F33.2] 05/03/2016  . Community acquired pneumonia [J18.9]   . Asthma [J45.909] 04/14/2016  . Obesity [E66.9] 07/15/2015  . Opioid use disorder, moderate, in sustained remission (HCC) [F11.21] 06/23/2015  . Cocaine use disorder, moderate, in sustained remission (HCC) [F14.21] 06/23/2015  . Hyperprolactinemia (HCC) [E22.1] 06/23/2015  . Benzodiazepine abuse [F13.10] 06/22/2015  . PTSD (post-traumatic stress disorder) [F43.10] 04/20/2015  . Opioid type dependence, continuous (HCC) [F11.20] 04/20/2015  . Cannabis use disorder, severe, dependence (HCC) [F12.20] 04/20/2015  . Tobacco use disorder [F17.200] 04/20/2015    Total Time spent with patient: 30 minutes  Musculoskeletal: Strength & Muscle Tone: within normal limits Gait & Station: normal Patient leans: N/A  Psychiatric Specialty Exam: ROS denies chest pain, no shortness of breath, no nausea, no vomiting   Blood pressure 109/65, pulse (!) 57, temperature 97.8 F (36.6 C), temperature source Oral, resp. rate 18, height 5\' 3"  (1.6 m), weight 134.7 kg (297 lb), SpO2 97 %.Body mass index is 52.61 kg/m.  General Appearance: Fairly Groomed  Patent attorneyye Contact::  Good  Speech:  Normal Rate409  Volume:  Normal  Mood:  reports his mood is improved and minimizes depression at this time  Affect:  remains vaguely irritable, but does smile at times appropriately   Thought Process:  Linear  Orientation:  Full (Time, Place, and Person)  Thought Content:  denies hallucinations, no delusions, not internally preoccupied   Suicidal Thoughts:  No denies any suicidal or self injurious ideations, denies any homicidal or violent ideations    Homicidal Thoughts:  No  Memory:  recent and remote grossly intact   Judgement:  Other:  improving   Insight:  improving   Psychomotor Activity:  Decreased  Concentration:  Good  Recall:  Good  Fund of Knowledge:Good  Language: Good  Akathisia:  Negative  Handed:  Right  AIMS (if indicated):     Assets:  Desire for Improvement Resilience  Sleep:  Number of Hours: 6.5  Cognition: WNL  ADL's:  Intact   Mental Status Per Nursing Assessment::   On Admission:  NA  Demographic Factors:  31 year old male   Loss Factors: Recent argument with father, recent relapse   Historical Factors: History of prior psychiatric admissions , history of depression, history of poly substance abuse   Risk Reduction Factors:   Positive coping skills or problem solving skills and at this time motivated in treatment and in returning [facility] for ongoing work on recovery and relapse prevention  Continued Clinical Symptoms:  At this time patient is alert, attentive, improved eye contact, calm, no restlessness or agitation, he is reporting feeling better, affect is vaguely irritable but more reactive, denies any suicidal ideations or any self injurious ideations, denies any homicidal or violent ideations, denies hallucinations, no delusions. Denies medication side effects, states he slept "OK" in spite of Seroquel taper. At this time he is future oriented, explains " I basically came here just to be put back on my medications. What I really want to do is to go back [facility]", which he states has been very helpful to him in working on sobriety. We reviewed medication side effects- as noted, patient has had significant weight gain over recent months  and is obese- we discussed importance of physical activity, healthy diet, and reviewed rationale for tapering off Seroquel, which he has reported he would rather do gradually, as it has been helping clinically . Of note - Lipid panel is WNL.    Cognitive Features That Contribute To Risk:  No gross cognitive deficits noted upon discharge. Is alert , attentive, and oriented x 3   Suicide Risk:  Mild:  Suicidal ideation of limited frequency, intensity, duration, and specificity.  There are no identifiable plans, no associated intent, mild dysphoria and related symptoms, good self-control (both objective and subjective assessment), few other risk factors, and identifiable protective factors, including available and accessible social support.  Follow-up Information    [facility] Follow up on 06/30/2016.   Why:  Patient reports having an appointment already scheduled at 4:00 pm for medication management.   Contact information: [address] [place] [address] [phone]       [facility] Follow up.   Why:  Please return to continue your treatment of substance abuse Contact information: [address] [place], [place] [address] Phone: [phone]          Plan Of Care/Follow-up recommendations:  Activity:  as tolerated  Diet:  Heart Healthy Tests:  NA Other:  see below   Patient is requesting discharge and there are no current grounds for involuntary commitment  He is leaving unit in good spirits  Plans to return to [facility] for ongoing substance abuse treatment /support Plans to follow up at [facility] ( has appointment later today) for medical care as needed   Nehemiah Massed, MD 06/30/2016, 12:13 PM
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
## 2016-06-30 NOTE — Progress Notes (Signed)
Adult Psychoeducational Group Note  Date:  06/30/2016 Time:  12:12 AM  Group Topic/Focus:  Wrap-Up Group:   The focus of this group is to help patients review their daily goal of treatment and discuss progress on daily workbooks.  Participation Level:  Minimal  Participation Quality:  Appropriate  Affect:  Flat  Cognitive:  Alert  Insight: Appropriate  Engagement in Group:  Engaged  Modes of Intervention:  Discussion  Additional Comments:  Patient did not have a goal but is excited and looking forward to being d/c tomorrow.   Celes Dedic H 06/30/2016, 12:12 AM

## 2016-06-30 NOTE — Progress Notes (Signed)
  [facility] Adult Case Management Discharge Plan :  Will you be returning to the same living situation after discharge:  Yes,  Pt returning [facility] At discharge, do you have transportation home?: Yes,  Pt provided with bus pass Do you have the ability to pay for your medications: Yes,  Pt provided with samples and prescriptions  Release of information consent forms completed and in the chart;  Patient's signature needed at discharge.  Patient to Follow up at: Follow-up Information    [facility] Follow up on 06/30/2016.   Why:  Patient reports having an appointment already scheduled at 4:00 pm for medication management.   Contact information: [address] [place] [phone]       [facility] Follow up.   Why:  Please return to continue your treatment of substance abuse Contact information: [address] [place], [place] [address] Phone: [phone]          Next level of care provider has access to [facility] Link:no  Safety Planning and Suicide Prevention discussed: Yes,  with Pt; declines family contact  Have you used any form of tobacco in the last 30 days? (Cigarettes, Smokeless Tobacco, Cigars, and/or Pipes): Yes  Has patient been referred to the Quitline?: Patient refused referral  Patient has been referred for addiction treatment: Yes  Verdene LennertLauren C Yisroel Mullendore 06/30/2016, 11:22 AM

## 2016-07-01 DIAGNOSIS — M792 Neuralgia and neuritis, unspecified: Secondary | ICD-10-CM | POA: Insufficient documentation

## 2016-07-01 LAB — HEMOGLOBIN A1C
Hgb A1c MFr Bld: 5.1 % (ref 4.8–5.6)
Mean Plasma Glucose: 100 mg/dL
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
## 2016-08-12 ENCOUNTER — Telehealth: Payer: Self-pay | Admitting: Family Medicine

## 2016-08-12 DIAGNOSIS — M792 Neuralgia and neuritis, unspecified: Secondary | ICD-10-CM

## 2016-08-12 DIAGNOSIS — I10 Essential (primary) hypertension: Secondary | ICD-10-CM

## 2016-08-12 MED ORDER — GABAPENTIN 300 MG PO CAPS: 300.0000 mg | ORAL_CAPSULE | Freq: Three times a day (TID) | ORAL | 3 refills | Status: AC

## 2016-08-12 MED ORDER — DILTIAZEM HCL ER COATED BEADS 120 MG PO CP24: 120.0000 mg | ORAL_CAPSULE | Freq: Every day | ORAL | 3 refills | Status: AC

## 2016-08-12 MED ORDER — ALBUTEROL SULFATE HFA 108 (90 BASE) MCG/ACT IN AERS: 1.0000 | INHALATION_SPRAY | RESPIRATORY_TRACT | 0 refills | Status: AC | PRN

## 2016-08-12 NOTE — Telephone Encounter (Signed)
Refilled chronic medications that had been prescribed by Dr. Venetia NightAmao. The rest were prescribed by behavioral health.

## 2016-08-12 NOTE — Telephone Encounter (Signed)
Patient called the office to request medication refill for all his medication. Please send them to our pharmacy.  Thank you.

## 2016-09-06 DEATH — deceased
